# Patient Record
Sex: Male | Born: 1955
Health system: Southern US, Community
[De-identification: ages and names within clinical notes are randomized; demographics above are authoritative.]

## PROBLEM LIST (undated history)

## (undated) DIAGNOSIS — G8929 Other chronic pain: Secondary | ICD-10-CM

## (undated) DIAGNOSIS — Z8679 Personal history of other diseases of the circulatory system: Secondary | ICD-10-CM

## (undated) DIAGNOSIS — M25569 Pain in unspecified knee: Secondary | ICD-10-CM

## (undated) DIAGNOSIS — F419 Anxiety disorder, unspecified: Secondary | ICD-10-CM

## (undated) DIAGNOSIS — Z9889 Other specified postprocedural states: Secondary | ICD-10-CM

## (undated) DIAGNOSIS — T7840XA Allergy, unspecified, initial encounter: Secondary | ICD-10-CM

## (undated) DIAGNOSIS — R9389 Abnormal findings on diagnostic imaging of other specified body structures: Secondary | ICD-10-CM

## (undated) DIAGNOSIS — Z9289 Personal history of other medical treatment: Secondary | ICD-10-CM

## (undated) DIAGNOSIS — I4891 Unspecified atrial fibrillation: Secondary | ICD-10-CM

## (undated) DIAGNOSIS — M1990 Unspecified osteoarthritis, unspecified site: Secondary | ICD-10-CM

## (undated) DIAGNOSIS — Z91030 Bee allergy status: Secondary | ICD-10-CM

## (undated) DIAGNOSIS — IMO0001 Reserved for inherently not codable concepts without codable children: Secondary | ICD-10-CM

## (undated) HISTORY — DX: Other chronic pain: G89.29

## (undated) HISTORY — DX: Abnormal findings on diagnostic imaging of other specified body structures: R93.89

## (undated) HISTORY — DX: Personal history of other diseases of the circulatory system: Z86.79

## (undated) HISTORY — DX: Personal history of other medical treatment: Z92.89

## (undated) HISTORY — DX: Other specified postprocedural states: Z98.890

## (undated) HISTORY — DX: Unspecified atrial fibrillation: I48.91

## (undated) HISTORY — DX: Bee allergy status: Z91.030

## (undated) HISTORY — DX: Anxiety disorder, unspecified: F41.9

## (undated) HISTORY — DX: Unspecified osteoarthritis, unspecified site: M19.90

## (undated) HISTORY — DX: Allergy, unspecified, initial encounter: T78.40XA

## (undated) HISTORY — PX: COLONOSCOPY: SHX174

## (undated) HISTORY — PX: ATRIAL FIBRILLATION ABLATION: EP1191

## (undated) HISTORY — PX: OTHER SURGICAL HISTORY: SHX169

## (undated) HISTORY — DX: Reserved for inherently not codable concepts without codable children: IMO0001

## (undated) HISTORY — DX: Pain in unspecified knee: M25.569

---

## 1999-01-23 ENCOUNTER — Emergency Department (HOSPITAL_COMMUNITY): Admission: EM | Admit: 1999-01-23 | Discharge: 1999-01-23 | Payer: Self-pay

## 2000-12-26 ENCOUNTER — Encounter (INDEPENDENT_AMBULATORY_CARE_PROVIDER_SITE_OTHER): Payer: Self-pay | Admitting: Specialist

## 2000-12-26 ENCOUNTER — Ambulatory Visit: Admission: RE | Admit: 2000-12-26 | Discharge: 2000-12-26 | Payer: Self-pay | Admitting: Critical Care Medicine

## 2000-12-26 ENCOUNTER — Encounter: Payer: Self-pay | Admitting: Critical Care Medicine

## 2002-09-02 ENCOUNTER — Encounter: Admission: RE | Admit: 2002-09-02 | Discharge: 2002-09-02 | Payer: Self-pay | Admitting: Urology

## 2002-09-02 ENCOUNTER — Encounter: Payer: Self-pay | Admitting: Urology

## 2002-09-04 ENCOUNTER — Encounter (INDEPENDENT_AMBULATORY_CARE_PROVIDER_SITE_OTHER): Payer: Self-pay | Admitting: Specialist

## 2002-09-04 ENCOUNTER — Ambulatory Visit (HOSPITAL_BASED_OUTPATIENT_CLINIC_OR_DEPARTMENT_OTHER): Admission: RE | Admit: 2002-09-04 | Discharge: 2002-09-04 | Payer: Self-pay | Admitting: Urology

## 2003-07-20 LAB — HM COLONOSCOPY

## 2003-09-17 ENCOUNTER — Ambulatory Visit (HOSPITAL_COMMUNITY): Admission: RE | Admit: 2003-09-17 | Discharge: 2003-09-17 | Payer: Self-pay | Admitting: Gastroenterology

## 2004-07-01 ENCOUNTER — Ambulatory Visit (HOSPITAL_BASED_OUTPATIENT_CLINIC_OR_DEPARTMENT_OTHER): Admission: RE | Admit: 2004-07-01 | Discharge: 2004-07-01 | Payer: Self-pay | Admitting: Orthopedic Surgery

## 2007-05-24 DIAGNOSIS — IMO0001 Reserved for inherently not codable concepts without codable children: Secondary | ICD-10-CM

## 2007-05-24 HISTORY — DX: Reserved for inherently not codable concepts without codable children: IMO0001

## 2009-04-08 ENCOUNTER — Emergency Department (HOSPITAL_BASED_OUTPATIENT_CLINIC_OR_DEPARTMENT_OTHER): Admission: EM | Admit: 2009-04-08 | Discharge: 2009-04-08 | Payer: Self-pay | Admitting: Emergency Medicine

## 2009-04-25 DIAGNOSIS — Z9889 Other specified postprocedural states: Secondary | ICD-10-CM

## 2009-04-25 HISTORY — DX: Other specified postprocedural states: Z98.890

## 2009-05-12 ENCOUNTER — Encounter: Admission: RE | Admit: 2009-05-12 | Discharge: 2009-05-12 | Payer: Self-pay | Admitting: Cardiology

## 2009-05-13 ENCOUNTER — Ambulatory Visit (HOSPITAL_COMMUNITY): Admission: RE | Admit: 2009-05-13 | Discharge: 2009-05-13 | Payer: Self-pay | Admitting: Cardiology

## 2009-05-13 ENCOUNTER — Encounter (INDEPENDENT_AMBULATORY_CARE_PROVIDER_SITE_OTHER): Payer: Self-pay | Admitting: Cardiology

## 2009-06-16 ENCOUNTER — Ambulatory Visit: Payer: Self-pay | Admitting: Radiology

## 2009-08-18 ENCOUNTER — Encounter: Payer: Self-pay | Admitting: Family Medicine

## 2009-08-26 ENCOUNTER — Emergency Department (HOSPITAL_BASED_OUTPATIENT_CLINIC_OR_DEPARTMENT_OTHER): Admission: EM | Admit: 2009-08-26 | Discharge: 2009-08-26 | Payer: Self-pay | Admitting: Emergency Medicine

## 2009-08-26 ENCOUNTER — Ambulatory Visit: Payer: Self-pay | Admitting: Radiology

## 2009-09-02 ENCOUNTER — Ambulatory Visit: Payer: Self-pay | Admitting: Family Medicine

## 2009-09-02 DIAGNOSIS — I4891 Unspecified atrial fibrillation: Secondary | ICD-10-CM | POA: Insufficient documentation

## 2009-09-02 DIAGNOSIS — R300 Dysuria: Secondary | ICD-10-CM | POA: Insufficient documentation

## 2009-09-02 LAB — CONVERTED CEMR LAB
Bilirubin Urine: NEGATIVE
Glucose, Urine, Semiquant: NEGATIVE
Protein, U semiquant: NEGATIVE
Specific Gravity, Urine: <1.005
pH: 5

## 2009-10-19 ENCOUNTER — Ambulatory Visit: Payer: Self-pay | Admitting: Family Medicine

## 2009-10-19 DIAGNOSIS — G4700 Insomnia, unspecified: Secondary | ICD-10-CM | POA: Insufficient documentation

## 2009-10-19 DIAGNOSIS — F411 Generalized anxiety disorder: Secondary | ICD-10-CM | POA: Insufficient documentation

## 2009-10-19 DIAGNOSIS — J301 Allergic rhinitis due to pollen: Secondary | ICD-10-CM | POA: Insufficient documentation

## 2010-02-04 ENCOUNTER — Ambulatory Visit: Payer: Self-pay | Admitting: Family Medicine

## 2010-02-04 DIAGNOSIS — R5381 Other malaise: Secondary | ICD-10-CM | POA: Insufficient documentation

## 2010-02-04 DIAGNOSIS — R5383 Other fatigue: Secondary | ICD-10-CM | POA: Insufficient documentation

## 2010-02-04 DIAGNOSIS — M2550 Pain in unspecified joint: Secondary | ICD-10-CM | POA: Insufficient documentation

## 2010-02-05 ENCOUNTER — Ambulatory Visit: Payer: Self-pay | Admitting: Family Medicine

## 2010-02-05 LAB — CONVERTED CEMR LAB: Rhuematoid fact SerPl-aCnc: <20 intl units/mL (ref 0–20)

## 2010-02-08 LAB — CONVERTED CEMR LAB
BUN: 11 mg/dL (ref 6–23)
Basophils Absolute: 0 10*3/uL (ref 0.0–0.1)
Basophils Relative: 1.1 % (ref 0.0–3.0)
Eosinophils Absolute: 0.2 10*3/uL (ref 0.0–0.7)
HCT: 42.5 % (ref 39.0–52.0)
Hemoglobin: 15 g/dL (ref 13.0–17.0)
Lymphs Abs: 0.8 10*3/uL (ref 0.7–4.0)
MCHC: 35.4 g/dL (ref 30.0–36.0)
Neutro Abs: 3.1 10*3/uL (ref 1.4–7.7)
Neutrophils Relative %: 68.8 % (ref 43.0–77.0)
Platelets: 146 10*3/uL — ABNORMAL LOW (ref 150.0–400.0)
TSH: 1.24 microintl units/mL (ref 0.35–5.50)
WBC: 4.4 10*3/uL — ABNORMAL LOW (ref 4.5–10.5)

## 2010-02-22 ENCOUNTER — Telehealth: Payer: Self-pay | Admitting: Family Medicine

## 2010-04-01 ENCOUNTER — Emergency Department (HOSPITAL_BASED_OUTPATIENT_CLINIC_OR_DEPARTMENT_OTHER): Admission: EM | Admit: 2010-04-01 | Discharge: 2009-06-16 | Payer: Self-pay | Admitting: Emergency Medicine

## 2010-04-30 ENCOUNTER — Other Ambulatory Visit: Payer: Self-pay | Admitting: Family Medicine

## 2010-04-30 ENCOUNTER — Ambulatory Visit
Admission: RE | Admit: 2010-04-30 | Discharge: 2010-04-30 | Payer: Self-pay | Source: Home / Self Care | Attending: Family Medicine | Admitting: Family Medicine

## 2010-04-30 LAB — CONVERTED CEMR LAB
Ketones, urine, test strip: NEGATIVE
Protein, U semiquant: NEGATIVE
Urobilinogen, UA: 0.2
WBC Urine, dipstick: NEGATIVE
pH: 7

## 2010-04-30 LAB — BASIC METABOLIC PANEL
BUN: 15 mg/dL (ref 6–23)
CO2: 32 mEq/L (ref 19–32)
Calcium: 9.4 mg/dL (ref 8.4–10.5)
Chloride: 100 mEq/L (ref 96–112)
Creatinine, Ser: 1 mg/dL (ref 0.4–1.5)
GFR: 79.16 mL/min (ref >60.00)
Glucose, Bld: 93 mg/dL (ref 70–99)
Potassium: 5.2 mEq/L — ABNORMAL HIGH (ref 3.5–5.1)
Sodium: 139 mEq/L (ref 135–145)

## 2010-04-30 LAB — HEPATIC FUNCTION PANEL
ALT: 30 U/L (ref 0–53)
AST: 27 U/L (ref 0–37)
Albumin: 4.4 g/dL (ref 3.5–5.2)
Alkaline Phosphatase: 58 U/L (ref 39–117)
Bilirubin, Direct: 0.1 mg/dL (ref 0.0–0.3)
Total Bilirubin: 0.9 mg/dL (ref 0.3–1.2)
Total Protein: 6.9 g/dL (ref 6.0–8.3)

## 2010-04-30 LAB — LIPID PANEL
Cholesterol: 179 mg/dL (ref 0–200)
HDL: 41 mg/dL (ref >39.00)
LDL Cholesterol: 122 mg/dL — ABNORMAL HIGH (ref 0–99)
Total CHOL/HDL Ratio: 4
Triglycerides: 82 mg/dL (ref 0.0–149.0)
VLDL: 16.4 mg/dL (ref 0.0–40.0)

## 2010-04-30 LAB — CBC WITH DIFFERENTIAL/PLATELET
Basophils Absolute: 0 10*3/uL (ref 0.0–0.1)
Basophils Relative: 0.8 % (ref 0.0–3.0)
Eosinophils Absolute: 0.3 10*3/uL (ref 0.0–0.7)
Eosinophils Relative: 5.5 % — ABNORMAL HIGH (ref 0.0–5.0)
HCT: 43.6 % (ref 39.0–52.0)
Hemoglobin: 15.2 g/dL (ref 13.0–17.0)
Lymphocytes Relative: 21.9 % (ref 12.0–46.0)
Lymphs Abs: 1.1 10*3/uL (ref 0.7–4.0)
MCHC: 34.9 g/dL (ref 30.0–36.0)
MCV: 92.4 fl (ref 78.0–100.0)
Monocytes Absolute: 0.3 10*3/uL (ref 0.1–1.0)
Monocytes Relative: 7 % (ref 3.0–12.0)
Neutro Abs: 3.2 10*3/uL (ref 1.4–7.7)
Neutrophils Relative %: 64.8 % (ref 43.0–77.0)
Platelets: 163 10*3/uL (ref 150.0–400.0)
RBC: 4.72 Mil/uL (ref 4.22–5.81)
RDW: 12.4 % (ref 11.5–14.6)
WBC: 4.9 10*3/uL (ref 4.5–10.5)

## 2010-04-30 LAB — TSH: TSH: 1.39 u[IU]/mL (ref 0.35–5.50)

## 2010-05-07 ENCOUNTER — Ambulatory Visit
Admission: RE | Admit: 2010-05-07 | Discharge: 2010-05-07 | Payer: Self-pay | Source: Home / Self Care | Attending: Family Medicine | Admitting: Family Medicine

## 2010-05-07 ENCOUNTER — Other Ambulatory Visit: Payer: Self-pay | Admitting: Family Medicine

## 2010-05-07 LAB — PSA: PSA: 0.53 ng/mL (ref 0.10–4.00)

## 2010-05-25 NOTE — Assessment & Plan Note (Signed)
Summary: NEW PT TO LBF/TO EST/CJR   Vital Signs:  Patient profile:   55 year old male Height:      78 inches Weight:      78 pounds BMI:     9.05 Temp:     97.7 degrees F oral Pulse rate:   60 / minute Pulse rhythm:   regular Resp:     12 per minute BP sitting:   110 / 62  (left arm) Cuff size:   large  Vitals Entered By: Sid Falcon LPN (Sep 02, 2009 4:04 PM) CC: New to establish, pain with urination Is Patient Diabetic? No   History of Present Illness: Here to establish as new pt.  Hx a fib/flutter with ablation procedure in [place] 2weeks ago which went well. Had foley catheter and now presents with burning with urination past week.  No fever, chills, nausea, vomiting, or back pain.   PMH, SH , AND FH reviewed.  Preventive Screening-Counseling & Management  Alcohol-Tobacco     Smoking Status: never     Year Quit: 1978  Caffeine-Diet-Exercise     Does Patient Exercise: no  Allergies (verified): No Known Drug Allergies  Past History:  Family History: Last updated: 09/02/2009 Family History of Alcoholism/Addiction, mother and father Family History of Arthritis, maternal grandmother  Social History: Last updated: 09/02/2009 Occupation:  Production designer, theatre/television/film Married Never Smoked Alcohol use-no Regular exercise-no  Risk Factors: Exercise: no (09/02/2009)  Risk Factors: Smoking Status: never (09/02/2009)  Past Medical History: A-Fib A-Flutter  Past Surgical History: Orthroscopic knee surgery bil [place] veins stripped both legs left testicle removed  (no cancer, benign mass)  Family History: Family History of Alcoholism/Addiction, mother and father Family History of Arthritis, maternal grandmother  Social History: Occupation:  Production designer, 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    Physical Exam  General:  Well-developed,well-nourished,in no acute distress; alert,appropriate and cooperative throughout examination Mouth:  Oral mucosa and oropharynx without lesions or exudates.  Teeth in good repair. Neck:  No deformities, masses, or tenderness noted. Lungs:  Normal respiratory effort, chest expands symmetrically. Lungs are clear to auscultation, no crackles or wheezes. Heart:  normal rate, regular rhythm, and no murmur.   Extremities:  no edema.   Impression & Recommendations:  Problem # 1:  DYSURIA (ICD-788.1) Assessment New Probable UTI with recent Foley.  urine cx and start Cipro pending cx. Orders: T-Culture, Urine ([id number]) UA Dipstick w/o Micro (manual) (25956)  His updated medication list for this problem includes:    Ciprofloxacin Hcl 500 Mg Tabs (Ciprofloxacin hcl) ..... One by mouth two times a day for 5 days  Problem # 2:  ATRIAL FIBRILLATION (ICD-427.31)  His updated medication list for this problem includes:    Metoprolol Succinate 100 Mg Xr24h-tab (Metoprolol succinate) ..... Once daily    Multaq 400 Mg Tabs (Dronedarone hcl) ..... One tab two times a day  Complete Medication List: 1)  Metoprolol Succinate 100 Mg Xr24h-tab (Metoprolol succinate) .... Once daily 2)  Citalopram Hydrobromide 20 Mg Tabs (Citalopram hydrobromide) .... Once daily 3)  Fexofenadine Hcl 180 Mg Tabs (Fexofenadine hcl) .... Once daily 4)  Multaq 400 Mg Tabs (Dronedarone hcl) .... One tab two times a day 5)  Ciprofloxacin Hcl 500 Mg Tabs (Ciprofloxacin hcl) .Marland KitchenMarland KitchenMarland Kitchen  One by mouth two times a day for 5 days 6)  Pradaxa 75 Mg Caps (Dabigatran etexilate mesylate) .... One by mouth bid  Patient Instructions: 1)  Drink plenty of fluids up to 3-4 quarts a day. Cranberry juice is especially recommended in addition to large amounts of water.  Avoid caffeine & carbonated drinks, they tend to irritate the bladder, Return in 3-5 days if you're not better: sooner if you're feeling worse.  Prescriptions: CIPROFLOXACIN HCL 500 MG TABS (CIPROFLOXACIN HCL) one by mouth two times a day for 5 days  #10 x 0   Entered and Authorized by:   Evelena Peat MD   Signed by:   Evelena Peat MD on 09/02/2009   Method used:   [facility]        [facility]  [place] [address]       [address]       [place], [place]  [address]       Ph: [phone] or [phone]       Fax: [phone]   RxID:   [id number]   Preventive Care Screening  Last Tetanus Booster:    Date:  04/26/2007    Results:  Historical   Colonoscopy:    Date:  04/26/2003    Results:  normal      Pneumovax 2007   Laboratory Results   Urine Tests    Routine Urinalysis   Color: yellow Appearance: Clear Glucose: negative   (Normal Range: Negative) Bilirubin: negative   (Normal Range: Negative) Ketone: negative   (Normal Range: Negative) Spec. Gravity: <1.005   (Normal Range: 1.003-1.035) Blood: small   (Normal Range: Negative) pH: 5.0   (Normal Range: 5.0-8.0) Protein: negative   (Normal Range: Negative) Urobilinogen: 0.2   (Normal Range: 0-1) Nitrite: negative   (Normal Range: Negative) Leukocyte Esterace: moderate   (Normal Range: Negative)    Comments: Sid Falcon LPN  Sep 02, 2009 4:31 PM

## 2010-05-25 NOTE — Assessment & Plan Note (Signed)
Summary: NUMBNESS AND PAIN IN SHOULDERS AND ELBOW/ ALSO IN FINGERS/CJR   Vital Signs:  Patient profile:   55 year old male Weight:      238 pounds Temp:     98.4 degrees F oral BP sitting:   100 / 64  (left arm) Cuff size:   large  Vitals Entered By: Sid Falcon LPN (February 04, 2010 1:33 PM)  History of Present Illness: Patient seen with multiple nonspecific complaints. He had history of ablation for atrial fibrillation several months ago that and has had no evidence for recurrent A. fib. He describes bilateral shoulder achiness over the past several weeks but no other arthralgias. He has increased fatigue and overall poor sleep quality the past few months. No specific stressors. He feels "jittery" frequently. Denies any appetite or weight changes. He sleeps about 6-7 hours of restless sleep at night.  No leg discomfort.  Has tried Advil p.m. without much success. No significant caffeine use.  Denies depressive symptoms. No visible joint swelling, erythema, or warmth. Patient reports prior history of low testosterone but was never treated.  Not checked in some time.  Allergies (verified): 1)  ! 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    Physical Exam  General:  Well-developed,well-nourished,in no acute distress; alert,appropriate and cooperative throughout examination Eyes:  pupils equal, pupils round, and pupils reactive to light.   Ears:  External ear exam shows no significant lesions or deformities.  Otoscopic examination reveals clear canals, tympanic membranes are intact bilaterally without bulging, retraction, inflammation or discharge. Hearing is grossly normal bilaterally. Mouth:  Oral mucosa and oropharynx without lesions or exudates.  Teeth in good repair. Neck:  No deformities, masses, or tenderness noted. Lungs:  Normal respiratory effort, chest expands symmetrically. Lungs are clear to auscultation, no crackles or wheezes. Heart:  Normal rate and regular rhythm. S1 and S2 normal without gallop, murmur, click, rub or other extra sounds. Abdomen:  Bowel sounds positive,abdomen soft and non-tender without masses, organomegaly or hernias noted. Msk:  No deformity or scoliosis noted of thoracic or lumbar spine.   Extremities:  No clubbing, cyanosis, edema, or deformity noted with normal full range of motion of all joints.   Neurologic:  alert & oriented X3, cranial nerves II-XII intact, strength normal in all extremities, and gait normal.   Skin:  no rashes.   Psych:  normally interactive, good eye contact, not anxious appearing, and not depressed appearing.     Impression & Recommendations:  Problem # 1:  INSOMNIA, CHRONIC (ICD-307.42) sleep hygiene discussed.  Short term trial of AMbien.  Problem # 2:  ARTHRALGIA (ICD-719.40) check screeing labs.  No evidence for active inflammatory arthritis on exam today.  Problem # 3:  FATIGUE (ICD-780.79) ?  sec to low  testosterone.  Will repeat labs.  Complete Medication List: 1)  Metoprolol Succinate 100 Mg Xr24h-tab (Metoprolol succinate) .... 1/2 daily 2)  Citalopram Hydrobromide 20 Mg Tabs (Citalopram hydrobromide) .... Once daily 3)  Fexofenadine Hcl 180 Mg Tabs (Fexofenadine hcl) .... Once daily 4)  Aspirin 325 Mg Tabs (Aspirin) .... Once daily 5)  Ambien 10 Mg Tabs (Zolpidem tartrate) .... One by mouth at bedtime as needed insomnia  Other Orders: Admin 1st Vaccine (14782) Flu Vaccine 67yrs + (95621)  Patient Instructions: 1)  Schedule the following labs:  CBC, BMP, TSH, ESR, RA latex, ANA antibodies, Total testosterone levels   780.79, 719.40 Prescriptions: AMBIEN 10 MG TABS (ZOLPIDEM TARTRATE) one by mouth at bedtime as needed insomnia  #30 x 0   Entered and Authorized by:   Evelena Peat MD   Signed by:   Evelena Peat MD on 02/04/2010   Method used:   Print then Give to Patient   RxID:   [id number]      Flu Vaccine Consent Questions     Do you have a history of severe allergic reactions to this vaccine? no    Any prior history of allergic reactions to egg and/or gelatin? no    Do you have a sensitivity to the preservative Thimersol? no    Do you have a past history of Guillan-Barre Syndrome? no    Do you currently have an acute febrile illness? no    Have you ever had a severe reaction to latex? no    Vaccine information given and explained to patient? yes    Are you currently pregnant? no    Lot Number:[id number]   Exp Date:10/23/2010   Site Given  Left Deltoid IMlu

## 2010-05-25 NOTE — Assessment & Plan Note (Signed)
Summary: med check and refills/cjr   Vital Signs:  Patient profile:   55 year old male Temp:     98.8 degrees F oral BP sitting:   120 / 80  (left arm) Cuff size:   large  Vitals Entered By: Sid Falcon LPN (October 19, 2009 4:31 PM) CC: med check, reillls   History of Present Illness: Hx ablation for a fib April in [place].  Good results with no recurrence. Has been taken off Pradaxa and Multaq. Needs refills of several medications.  Remains on metorolol.  No recent palpitations. No chest pain.  Seasonal allergies on fexofenadine and controlled. Hx of chronic anxiety controlled with citalopram.  No hx of depression.  Chronic intermittent insomnia.  Has used Tylenol PM and Klonopin in past with good success. No signif caffeine use.  Drinks about 2 glasses wine per night.  Low stress levels. Able to fall asleep but freq wakes about 3 am and unable to go back to sleep.  Needs med refills.  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    Physical Exam  General:  Well-developed,well-nourished,in no acute  distress; alert,appropriate and cooperative throughout examination Head:  Normocephalic and atraumatic without obvious abnormalities. No apparent alopecia or balding. Mouth:  Oral mucosa and oropharynx without lesions or exudates.  Teeth in good repair. Neck:  No deformities, masses, or tenderness noted. Lungs:  Normal respiratory effort, chest expands symmetrically. Lungs are clear to auscultation, no crackles or wheezes. Heart:  Normal rate and regular rhythm. S1 and S2 normal without gallop, murmur, click, rub or other extra sounds. Extremities:  no edema. Psych:  normally interactive, good eye contact, not anxious appearing, and not depressed appearing.     Impression & Recommendations:  Problem # 1:  ATRIAL FIBRILLATION (ICD-427.31) Assessment Improved currently sinus rhythm after ablation procedure. The following medications were removed from the medication list:    Multaq 400 Mg Tabs (Dronedarone hcl) ..... One tab two times a day His updated medication list for this problem includes:    Metoprolol Succinate 100 Mg Xr24h-tab (Metoprolol succinate) ..... Once daily  Problem # 2:  INSOMNIA, CHRONIC (ICD-307.42) Assessment: Deteriorated sleep hygiene discussed with handout given.  Rec reduce ETOH consumption at night and discussed how ETOH metabolism can trigger early morning awakening.  Problem # 3:  ANXIETY STATE, UNSPECIFIED (ICD-300.00) refilled med His updated medication list for this problem includes:    Citalopram Hydrobromide 20 Mg Tabs (Citalopram hydrobromide) ..... Once daily  Problem # 4:  ALLERGIC RHINITIS, SEASONAL (ICD-477.0)  Complete Medication List: 1)  Metoprolol Succinate 100 Mg Xr24h-tab (Metoprolol succinate) .... Once daily 2)  Citalopram Hydrobromide 20 Mg Tabs (Citalopram hydrobromide) .... Once daily 3)  Fexofenadine Hcl 180 Mg Tabs (Fexofenadine hcl) .... Once daily  Patient Instructions: 1)  Consider gradually scaling back night time  use of alcohol  which might reduce your early morning awakening. 2)  OK to use Tylenol PM. Prescriptions: FEXOFENADINE HCL 180 MG TABS (FEXOFENADINE HCL) once daily  #90 x 3   Entered and Authorized by:   Evelena Peat MD   Signed by:   Evelena Peat MD on 10/19/2009   Method used:   Electronically to        [facility]* (mail-order)       [address] [place], [place]  [address]       Ph: [phone]       Fax: [phone]   RxID:   [id number] CITALOPRAM HYDROBROMIDE 20 MG TABS (CITALOPRAM HYDROBROMIDE) once daily  #90 x 3   Entered and Authorized by:   Evelena Peat MD   Signed by:   Evelena Peat MD on 10/19/2009   Method used:   Electronically to        [facility]* (mail-order)       [address] [place], [place]  [address]       Ph: [phone]       Fax: [phone]   RxID:   [id number] METOPROLOL SUCCINATE 100 MG XR24H-TAB (METOPROLOL SUCCINATE) once daily  #90 x 3   Entered and Authorized by:   Evelena Peat MD   Signed by:   Evelena Peat MD on 10/19/2009   Method used:   Electronically to        [facility]* (mail-order)       [address] [place], [place]  [address]       Ph: [phone]       Fax: [phone]   RxID:   [id number]

## 2010-05-25 NOTE — Progress Notes (Signed)
Summary: Ambien, ins will pay for #10 only monthly, FYI  Phone Note Refill Request Message from:  Patient on February 22, 2010 11:29 AM  Refills Requested: Medication #1:  AMBIEN 10 MG TABS one by mouth at bedtime as needed insomnia.   Notes: [facility].    Initial call taken by: Debbra Riding,  February 22, 2010 11:29 AM  Follow-up for Phone Call        This med was just filled at [facility] on 10/13, #30 with 0 refills. LMTCB to discuss Follow-up by: Sid Falcon LPN,  February 22, 2010 5:06 PM  Additional Follow-up for Phone Call Additional follow up Details #1::        returning Nancy's call. please call [phone] Additional Follow-up by: Warnell Forester,  February 23, 2010 12:49 PM    Additional Follow-up for Phone Call Additional follow up Details #2::    Spoke with pt, he states [facility] [place] only disp [facility] #10 pills on 10/13, something about his insurance would only allow #10.  Pt asked I call his [facility] and provide additional information if necessary.  Per Olegario Messier, pharmacist, his insurance will only pay for #10 every 30 days.  Pt may request a prior authorization be faxed to Dr Caryl Never to take more frequently.  Pt informed that [facility] could [facility] to request a prior auth.  Other option is to pay out of pocket for additional Ambien if needed every night. Sid Falcon LPN  February 24, 2010 10:02 AM Will be happy to complete prio Thereasa Parkin if he so desires. Follow-up by: Evelena Peat MD,  February 24, 2010 10:58 AM

## 2010-05-27 NOTE — Assessment & Plan Note (Signed)
Summary: CPX//SLM   Vital Signs:  Patient profile:   55 year old male Height:      78 inches Weight:      240 pounds Temp:     97.9 degrees F oral BP sitting:   108 / 68  (left arm) Cuff size:   large  Vitals Entered By: Sid Falcon LPN (May 07, 2010 2:07 PM)  History of Present Illness: Here for CPE.   Increased job stress and poor sleep. Has used Clonazepam in past with success. Ambien with mixed results.  Insomnia is chronic and intermittent, No signif caffeine or ETOH use.  Hx A fib and ablation procedure. Tapering off metoprolol.  Colonoscopy and tetanus up to date.  Plans to start more regualr exercise soon.  Clinical Review Panels:  Prevention   Last Colonoscopy:  normal (04/26/2003)  Immunizations   Last Tetanus Booster:  Historical (04/26/2007)   Last Flu Vaccine:  Fluvax 3+ (02/04/2010)   Allergies: 1)  ! 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  Past Surgical History: Orthroscopic knee surgery bil [place] veins stripped both legs left testicle removed  (no cancer, benign mass) Ablation procedure for A fib PMH-FH-SH reviewed for relevance  Review of Systems  The patient denies anorexia, fever, weight loss, weight gain, vision loss, decreased hearing, hoarseness, chest pain, syncope, dyspnea on exertion, peripheral edema, prolonged cough, headaches, hemoptysis, abdominal pain, melena, hematochezia, severe indigestion/heartburn, hematuria, incontinence, genital sores, muscle weakness, suspicious skin lesions,  transient blindness, difficulty walking, depression, unusual weight change, abnormal bleeding, enlarged lymph nodes, and testicular masses.    Physical Exam  General:  Well-developed,well-nourished,in no acute distress; alert,appropriate and cooperative throughout examination Head:  Normocephalic and atraumatic without obvious abnormalities. No apparent alopecia or balding. Eyes:  No corneal or conjunctival inflammation noted. EOMI. Perrla. Funduscopic exam benign, without hemorrhages, exudates or papilledema. Vision grossly normal. Ears:  External ear exam shows no significant lesions or deformities.  Otoscopic examination reveals clear canals, tympanic membranes are intact bilaterally without bulging, retraction, inflammation or discharge. Hearing is grossly normal bilaterally. Mouth:  Oral mucosa and oropharynx without lesions or exudates.  Teeth in good repair. Neck:  No deformities, masses, or tenderness noted. Lungs:  Normal respiratory effort, chest expands symmetrically. Lungs are clear to auscultation, no crackles or wheezes. Heart:  Normal rate and regular rhythm. S1 and S2 normal without gallop, murmur, click, rub or other extra sounds. Abdomen:  Bowel sounds positive,abdomen soft and non-tender without masses, organomegaly or hernias noted. Rectal:  No external abnormalities noted. Normal sphincter tone. No rectal masses or tenderness. Prostate:  Prostate gland firm and smooth, no enlargement, nodularity, tenderness, mass, asymmetry or induration. Msk:  No deformity or scoliosis noted of thoracic or lumbar spine.   Extremities:  No clubbing, cyanosis, edema, or deformity noted with normal full range of motion of all joints.   Neurologic:  alert & oriented X3, cranial nerves II-XII intact, and strength normal in all extremities.   Skin:  no rashes and no suspicious lesions.   Cervical Nodes:  No lymphadenopathy noted Psych:  Cognition and judgment appear intact. Alert and cooperative  with normal attention span and concentration. No apparent  delusions, illusions, hallucinations   Impression & Recommendations:  Problem # 1:  ROUTINE GENERAL MEDICAL EXAM@HEALTH  CARE FACL (ICD-V70.0) Add PSA to labs as this was overlooked.   other labs reviewed with pt. Orders: TLB-PSA (Prostate Specific Antigen) ([id number])  Problem # 2:  INSOMNIA, CHRONIC (ICD-307.42) refilled Clonazepam to use as needed for insomnia.  He will not take any more ambien. Sleep hygiene discussed.  Complete Medication List: 1)  Metoprolol Succinate 25 Mg Xr24h-tab (Metoprolol succinate) 2)  Citalopram Hydrobromide 20 Mg Tabs (Citalopram hydrobromide) .... Once daily 3)  Fexofenadine Hcl 180 Mg Tabs (Fexofenadine hcl) .... Once daily 4)  Aspirin 325 Mg Tabs (Aspirin) .... Once daily 5)  Clonazepam 1 Mg Tabs (Clonazepam) .... One half to one by mouth at bedtime as needed 6)  Epipen 2-pak 0.3 Mg/0.41ml Devi (Epinephrine) .... As needed bee stings  Patient Instructions: 1)  It is important that you exercise reguarly at least 20 minutes 5 times a week. If you develop chest pain, have severe difficulty breathing, or feel very tired, stop exercising immediately and seek medical attention.  2)  Take one half tablet of metoprolol for another 2-3 weeks then discontinue. Prescriptions: CLONAZEPAM 1 MG TABS (CLONAZEPAM) one half to one by mouth at bedtime as needed  #30 x 3   Entered and Authorized by:   Evelena Peat MD   Signed by:   Evelena Peat MD on 05/07/2010   Method used:   Print then Give to Patient   RxID:   [id number]    Orders Added: 1)  TLB-PSA (Prostate Specific Antigen) [[id number]] 2)  Est. Patient 40-64 years [[id number]]

## 2010-07-12 ENCOUNTER — Ambulatory Visit (INDEPENDENT_AMBULATORY_CARE_PROVIDER_SITE_OTHER): Payer: BC Managed Care – PPO | Admitting: Family Medicine

## 2010-07-12 ENCOUNTER — Encounter: Payer: Self-pay | Admitting: Family Medicine

## 2010-07-12 VITALS — BP 102/70 | Temp 99.4°F | Ht 78.0 in | Wt 239.0 lb

## 2010-07-12 DIAGNOSIS — T7840XA Allergy, unspecified, initial encounter: Secondary | ICD-10-CM

## 2010-07-12 DIAGNOSIS — F3289 Other specified depressive episodes: Secondary | ICD-10-CM

## 2010-07-12 DIAGNOSIS — F411 Generalized anxiety disorder: Secondary | ICD-10-CM

## 2010-07-12 DIAGNOSIS — G4700 Insomnia, unspecified: Secondary | ICD-10-CM

## 2010-07-12 DIAGNOSIS — F329 Major depressive disorder, single episode, unspecified: Secondary | ICD-10-CM

## 2010-07-12 DIAGNOSIS — F32A Depression, unspecified: Secondary | ICD-10-CM

## 2010-07-12 DIAGNOSIS — J209 Acute bronchitis, unspecified: Secondary | ICD-10-CM

## 2010-07-12 DIAGNOSIS — R208 Other disturbances of skin sensation: Secondary | ICD-10-CM

## 2010-07-12 DIAGNOSIS — R209 Unspecified disturbances of skin sensation: Secondary | ICD-10-CM

## 2010-07-12 MED ORDER — EPINEPHRINE 0.3 MG/0.3ML IJ DEVI: 0.3000 mg | Freq: Once | INTRAMUSCULAR | Status: AC

## 2010-07-12 MED ORDER — CLONAZEPAM 1 MG PO TABS: 1.0000 mg | ORAL_TABLET | Freq: Every evening | ORAL | Status: DC | PRN

## 2010-07-12 MED ORDER — CITALOPRAM HYDROBROMIDE 20 MG PO TABS: 20.0000 mg | ORAL_TABLET | Freq: Every day | ORAL | Status: DC

## 2010-07-12 MED ORDER — HYDROCOD POLST-CHLORPHEN POLST 10-8 MG/5ML PO LQCR: 5.0000 mL | Freq: Two times a day (BID) | ORAL | Status: DC | PRN

## 2010-07-12 NOTE — Progress Notes (Signed)
  Subjective:    Patient ID: Jeffrey [name], male    DOB: March 11, 1956, 55 y.o.   MRN: [id number]  HPI  Patient seen for the several following items  Onset last week of cough which is mostly nonproductive. Diffuse body aches. Cough especially bothersome at night. Has taken several over-the-counter medications without relief. Nonsmoker. Some minimal postnasal drip symptoms    patient has history of allergy to bee sting. Use an EpiPen and needs refills.   Approximately 2 weeks history of right anterior thigh burning sensation. No weakness. No low back pain. No associated rash. Symptoms relatively mild. No alleviating factors.  History of depression treated with Celexa 20 mg daily. Symptoms relatively stable. Increased job stress. No suicidal ideation.   Chronic insomnia treated recently with clonazepam which seems to be much more effective than Ambien. Takes 0.5 mg at night. No side effects.   Review of Systems  Constitutional: Negative for fever, chills and appetite change.  HENT: Positive for congestion. Negative for sore throat.   Respiratory: Positive for cough. Negative for wheezing.   Cardiovascular: Negative for chest pain, palpitations and leg swelling.  Gastrointestinal: Negative for nausea, vomiting and abdominal pain.  Genitourinary: Negative for dysuria.  Musculoskeletal: Positive for myalgias.  Skin: Negative for rash.  Hematological: Negative for adenopathy.       Objective:   Physical Exam  patient is alert and in no distress  oropharynx is clear Eardrums normal Neck supple no adenopathy Chest clear to auscultation Heart regular rhythm and rate no murmur  right thigh no rash. nontender to palpation Neuro exam strength is full lower extremities.       Assessment & Plan:   #1 acute bronchitis-most likely viral. Tussionex as needed for cough #2 dysesthesia right anterior thigh. Observation for now. #3 history of depression stable refill Celexa for one year #4  history of chronic insomnia. Using clonazepam with good success. Refilled for 6 months.

## 2010-07-13 LAB — CBC
HCT: 43.2 % (ref 39.0–52.0)
Hemoglobin: 14.6 g/dL (ref 13.0–17.0)
MCHC: 33.8 g/dL (ref 30.0–36.0)
MCV: 90.4 fL (ref 78.0–100.0)
Platelets: 204 K/uL (ref 150–400)
RBC: 4.77 MIL/uL (ref 4.22–5.81)
RDW: 13.1 % (ref 11.5–15.5)
WBC: 7.7 10*3/uL (ref 4.0–10.5)

## 2010-07-13 LAB — APTT: aPTT: 64 s — ABNORMAL HIGH (ref 24–37)

## 2010-07-13 LAB — DIFFERENTIAL
Basophils Absolute: 0.1 10*3/uL (ref 0.0–0.1)
Basophils Relative: 1 % (ref 0–1)
Eosinophils Absolute: 0.2 10*3/uL (ref 0.0–0.7)
Eosinophils Relative: 3 % (ref 0–5)
Lymphocytes Relative: 19 % (ref 12–46)
Lymphs Abs: 1.5 K/uL (ref 0.7–4.0)
Monocytes Absolute: 0.6 K/uL (ref 0.1–1.0)
Monocytes Relative: 8 % (ref 3–12)
Neutro Abs: 5.3 10*3/uL (ref 1.7–7.7)
Neutrophils Relative %: 69 % (ref 43–77)

## 2010-07-13 LAB — BASIC METABOLIC PANEL WITH GFR
BUN: 17 mg/dL (ref 6–23)
Calcium: 8.6 mg/dL (ref 8.4–10.5)
Chloride: 104 meq/L (ref 96–112)
GFR calc Af Amer: >60 mL/min (ref >60)
Glucose, Bld: 54 mg/dL — ABNORMAL LOW (ref 70–99)
Potassium: 4.8 meq/L (ref 3.5–5.1)

## 2010-07-13 LAB — BASIC METABOLIC PANEL
CO2: 29 mEq/L (ref 19–32)
Creatinine, Ser: 1 mg/dL (ref 0.4–1.5)
GFR calc non Af Amer: >60 mL/min (ref >60)
Sodium: 142 mEq/L (ref 135–145)

## 2010-07-13 LAB — PROTIME-INR
INR: 1.58 — ABNORMAL HIGH (ref 0.00–1.49)
Prothrombin Time: 18.7 s — ABNORMAL HIGH (ref 11.6–15.2)

## 2010-07-15 LAB — DIFFERENTIAL
Eosinophils Absolute: 0.2 10*3/uL (ref 0.0–0.7)
Eosinophils Relative: 4 % (ref 0–5)
Lymphs Abs: 1.9 10*3/uL (ref 0.7–4.0)
Monocytes Relative: 6 % (ref 3–12)

## 2010-07-15 LAB — CBC
HCT: 43.2 % (ref 39.0–52.0)
Hemoglobin: 15 g/dL (ref 13.0–17.0)
MCHC: 34.8 g/dL (ref 30.0–36.0)
RDW: 11.9 % (ref 11.5–15.5)
WBC: 5.3 10*3/uL (ref 4.0–10.5)

## 2010-07-15 LAB — BASIC METABOLIC PANEL
Calcium: 8.7 mg/dL (ref 8.4–10.5)
GFR calc Af Amer: >60 mL/min (ref >60)
Potassium: 3.9 mEq/L (ref 3.5–5.1)

## 2010-07-15 LAB — PROTIME-INR: Prothrombin Time: 24 seconds — ABNORMAL HIGH (ref 11.6–15.2)

## 2010-07-27 LAB — DIFFERENTIAL
Basophils Absolute: 0 10*3/uL (ref 0.0–0.1)
Eosinophils Absolute: 0.2 10*3/uL (ref 0.0–0.7)
Lymphocytes Relative: 19 % (ref 12–46)
Monocytes Absolute: 0.4 10*3/uL (ref 0.1–1.0)
Monocytes Relative: 7 % (ref 3–12)

## 2010-07-27 LAB — BASIC METABOLIC PANEL
BUN: 17 mg/dL (ref 6–23)
CO2: 28 mEq/L (ref 19–32)
Calcium: 9.6 mg/dL (ref 8.4–10.5)
Chloride: 105 mEq/L (ref 96–112)
GFR calc Af Amer: >60 mL/min (ref >60)
Glucose, Bld: 125 mg/dL — ABNORMAL HIGH (ref 70–99)
Sodium: 143 mEq/L (ref 135–145)

## 2010-07-27 LAB — CBC
Platelets: 171 10*3/uL (ref 150–400)
RBC: 5.41 MIL/uL (ref 4.22–5.81)

## 2010-07-27 LAB — POCT CARDIAC MARKERS: Myoglobin, poc: 38.8 ng/mL (ref 12–200)

## 2010-08-25 ENCOUNTER — Encounter: Payer: Self-pay | Admitting: Family Medicine

## 2010-08-25 ENCOUNTER — Ambulatory Visit (INDEPENDENT_AMBULATORY_CARE_PROVIDER_SITE_OTHER): Payer: BC Managed Care – PPO | Admitting: Family Medicine

## 2010-08-25 VITALS — BP 110/70 | Temp 98.3°F

## 2010-08-25 DIAGNOSIS — R209 Unspecified disturbances of skin sensation: Secondary | ICD-10-CM

## 2010-08-25 DIAGNOSIS — R202 Paresthesia of skin: Secondary | ICD-10-CM

## 2010-08-25 NOTE — Progress Notes (Signed)
  Subjective:    Patient ID: Jeffrey [name], male    DOB: 07-09-1955, 55 y.o.   MRN: [id number]  HPI Patient seen with persistent and progressive dysesthesias right thigh anteriorly. Symptoms started over 2 months ago. No definite weakness. Denies low back pain. Has not noted any rashes. Symptoms seem worse at night. Rated 4/10 in severity. Denies any other extremity paresthesias or dysesthesias. He denies any appetite or weight changes. No fevers, chills, or night sweats.   No alleviating or aggravating factors.   Review of Systems  Constitutional: Negative for fever, chills, activity change, appetite change and unexpected weight change.  Respiratory: Negative for shortness of breath.   Cardiovascular: Negative for chest pain, palpitations and leg swelling.  Gastrointestinal: Negative for abdominal pain.  Genitourinary: Negative for dysuria.  Neurological: Negative for dizziness, seizures, syncope, weakness, light-headedness and headaches.  Hematological: Negative for adenopathy. Does not bruise/bleed easily.       Objective:   Physical Exam  Constitutional: He is oriented to person, place, and time. He appears well-developed and well-nourished. No distress.  Cardiovascular: Normal rate, regular rhythm and normal heart sounds.   No murmur heard. Pulmonary/Chest: Effort normal and breath sounds normal. No respiratory distress. He has no wheezes.  Musculoskeletal: He exhibits no edema.       No tenderness lumbar spine. Straight leg raises negative. Full range of motion lumbosacral spine  Neurological: He is alert and oriented to person, place, and time. He has normal reflexes. No cranial nerve deficit.       Full-strength lower extremities. Subjective mild sensory impairment touch right anterior thigh  Psychiatric: He has a normal mood and affect.          Assessment & Plan:  Paresthesias and dysesthesias right anterior thigh. No focal weakness. Given duration of symptoms and  progression MRI lumbar spine to further evaluate. If normal consider neurology evaluation.  ?peripheral nerve compression.  We discussed possible treatments such as Lyrica but he wishes to wait at this time as symptoms are relatively mild.

## 2010-08-25 NOTE — Patient Instructions (Signed)
We will schedule MRI of your lumbar spine and call you after results are back.

## 2010-09-09 ENCOUNTER — Ambulatory Visit
Admission: RE | Admit: 2010-09-09 | Discharge: 2010-09-09 | Disposition: A | Discharge status: Home / Self Care | Payer: BC Managed Care – PPO | Source: Ambulatory Visit | Attending: Family Medicine | Admitting: Family Medicine

## 2010-09-09 DIAGNOSIS — R202 Paresthesia of skin: Secondary | ICD-10-CM

## 2010-09-10 NOTE — Op Note (Signed)
NAME:  Jeffrey [name], Jeffrey [name]                        ACCOUNT NO.:  [id number]   MEDICAL RECORD NO.:  [id number]                   PATIENT TYPE:  AMB   LOCATION:  [facility]                                 FACILITY:  [facility]   PHYSICIAN:  Mark C. Vernie Ammons, M.D.               DATE OF BIRTH:  [birth year]   DATE OF PROCEDURE:  09/04/2002  DATE OF DISCHARGE:                                 OPERATIVE REPORT   PREOPERATIVE DIAGNOSIS:  Left testicular mass.   POSTOPERATIVE DIAGNOSIS:  Left testicular mass.   PROCEDURE:  Left radical orchiectomy.   SURGEON:  Mark C. Vernie Ammons, M.D.   ANESTHESIA:  General LMA.   ESTIMATED BLOOD LOSS:  Approximately 5 mL   SPECIMENS:  Left testicle and spermatic cord to pathology.   COMPLICATIONS:  None.   INDICATIONS FOR PROCEDURE:  The patient is a 55 year old white male who has  developed a painless lump on the testicle. It was evaluated with ultrasound  and found to be consistent with an intratesticular abnormality with an  inhomogeneous echo texture. Beta HCG and alpha fetoprotein are negative,  chest x-ray is clear. He is brought to the OR for left radical orchiectomy.  He understands the risks, complications, alternatives and limitations.   DESCRIPTION OF PROCEDURE:  After informed consent, the patient brought to  the major OR, placed on the table in supine position, administered general  anesthesia. Genitalia and lower abdomen were sterilely prepped and draped. A  transverse left inguinal incision was then made and carried down through the  subcutaneous tissue, Scarpa's fascia and exposed the external oblique  fascia. This was incised along the course of the fibers exposing the cord.  The cord was isolated, clamped proximally and then further dissection was  undertaken to dissect the testicle from the scrotum. The gubernaculum was  cut with the Bovie electrocautery unit. I then freed up further cord into  the level of the high internal inguinal ring. The cord  was then isolated  into three sections with hemostat clamps. These were clamped and the cord  was divided proximal to this. Each portion of the cord then was doubly  ligated first with #1 Vicryl suture and then with a 2-0 silk suture. The 2-0  silk sutures were left long for future identification is necessary. I then  closed the external oblique fascia with a running 3-0 Vicryl suture.  Throughout the closure and operation, the ilioinguinal nerve was identified  and spared. I used 0.25% plain Marcaine then to infiltrate in the area of  the ilioinguinal nerves and in the subcu region using 30 mL of the solution.  Scarpa's fascia was then closed with running 3-0 Vicryl suture and three  interrupted Chromic sutures were placed in the deep subcu to reapproximate  the skin edges. The skin was then closed with running subcuticular 4-0  Vicryl suture. Steri-Strips were applied and the patient was awakened and  taken  to the recovery room in stable satisfactory condition. He tolerated  the procedure well with no known intraoperative complications. Sponge,  needle and instrument counts were reportedly correct x2 at the end of the  operation.   The patient will be given a prescription for 38 Tylox and 14 Vioxx 50 mg. He  will followup in my office in two weeks for recheck. I will contact him with  the results from pathology and scheduled a CT scan if indicated.                                               Mark C. Vernie Ammons, M.D.    MCO/MEDQ  D:  09/04/2002  T:  09/05/2002  Job:  [id number]

## 2010-09-10 NOTE — Op Note (Signed)
Jeffrey [name], Jeffrey [name]              ACCOUNT NO.:  [id number]   MEDICAL RECORD NO.:  [id number]          PATIENT TYPE:  AMB   LOCATION:  [facility]SC                          FACILITY:  [facility]   PHYSICIAN:  Loreta Ave, M.D. DATE OF BIRTH:  [birth year]   DATE OF PROCEDURE:  07/01/2004  DATE OF DISCHARGE:                                 OPERATIVE REPORT   PREOPERATIVE DIAGNOSIS:  Medial meniscus tear, left knee.   POSTOPERATIVE DIAGNOSIS:  Medial meniscus tear, left knee.   PROCEDURE:  Left knee examination under anesthesia, arthroscopy, partial  medial meniscectomy.   SURGEON:  Loreta Ave, M.D.   ASSISTANT:  Genene Churn. Denton Meek.   ANESTHESIA:  Knee block with sedation.   SPECIMENS:  None.   CULTURES:  None.   COMPLICATIONS:  None.   DRESSINGS:  Soft compressive.   TOURNIQUET TIME:  50 minutes.   DESCRIPTION OF PROCEDURE:  The patient was brought to the operating room and  placed on the operating table in the supine position. After adequate  anesthesia had been obtained,  tourniquet and leg holder were applied,  prepped and draped in the usual sterile fashion. Left knee had been examined  with full motion and good stability.  Three portals were created; 1  superolateral, 1 each  medial and lateral parapatellar.  Inflow catheter  introduced, knee was distended, arthroscope introduced, knee inspected.   A little focal grade 2 change of the lateral patellar facet, treated with  chondroplasty.  Marked reactive synovitis debrided. Remaining knee examined.  Articular cartilage looked quite good throughout.  Medial meniscus extensive  complex tearing with fragments folded underneath the meniscus.  Posterior  half removed after a stable rim.  Tapered into the remaining meniscus. All  recess examined, all loose fragments removed. Cruciate ligaments intact.  Good  patellofemoral tracking.  Lateral meniscus, lateral compartment looked good.  Instruments and fluid removed.  Portals  of knee injected with Marcaine.  Portals were closed with 4-0 nylon. Sterile, compressive dressing was  applied. Anesthesia reversed, brought to the recovery room, tolerated  surgery well, no complications.      DFM/MEDQ  D:  07/01/2004  T:  07/01/2004  Job:  [id number]

## 2010-09-10 NOTE — Op Note (Signed)
NAME:  Jeffrey [name], Jeffrey [name]                        ACCOUNT NO.:  [id number]   MEDICAL RECORD NO.:  [id number]                   PATIENT TYPE:  AMB   LOCATION:  ENDO                                 FACILITY:  [facility]   PHYSICIAN:  Anselmo Rod, M.D.               DATE OF BIRTH:  October 10, 1955   DATE OF PROCEDURE:  09/17/2003  DATE OF DISCHARGE:                                 OPERATIVE REPORT   PROCEDURE:  Screening colonoscopy.   ENDOSCOPIST:  Charna Elizabeth, M.D.   INSTRUMENT USED:  Olympus video colonoscope.   INDICATIONS FOR PROCEDURE:  55 year old white male undergoing screening  colonoscopy to rule out colonic polyps, masses, etc.   PREPROCEDURE PREPARATION:  Informed consent was procured from the patient.  The patient was fasted for eight hours prior to the procedure and prepped  with a bottle of magnesium citrate and a gallon of GoLYTELY the night prior  to the procedure.   PREPROCEDURE PHYSICAL:  Patient with stable vital signs.  Neck supple.  Chest clear to auscultation.  S1 and S2 regular.  Abdomen soft with normal  bowel sounds.   DESCRIPTION OF PROCEDURE:  The patient was placed in the left lateral  decubitus position, sedated with 75 mg of Demerol and 4 mg Versed  intravenously.  Once the patient was adequately sedated, maintained on low  flow oxygen and continuous cardiac monitoring, the Olympus video colonoscope  was advanced from the rectum to the cecum with difficulty.  There was some  residual stool in the colon, multiple washes were done.  The patient's  position was changed from the left lateral to the supine position, with  gentle application of abdominal pressure, to reach the cecal base.  The  appendiceal orifice and ileocecal valve were clearly visualized and  photographed.  No masses, polyps, erosions, ulcerations, or diverticula were  identified.  The terminal ileum appeared heathy and without lesions.  Retroflexion in the rectum revealed small internal  hemorrhoids.   IMPRESSION:  Unrevealing colonoscopy to the terminal ileum except for small  internal hemorrhoids.   RECOMMENDATIONS:  1. Continue a high fiber diet with liberal fluid intake.  2. Repeat CRC screening in the next ten years unless the patient develops     any abnormal symptoms in the interim.  3. Outpatient follow up as need arises in the future.                                               Anselmo Rod, M.D.    JNM/MEDQ  D:  09/17/2003  T:  09/17/2003  Job:  [id number]   cc:   Marjory Lies, M.D.  [address]  [place]  [place] [address]  Fax: [phone]

## 2010-09-10 NOTE — Procedures (Signed)
[facility]  Patient:    Jeffrey [name], PENTECOST Visit Number: [id number] MRN: [id number]          Service Type: DSU Location: CARD Attending Physician:  Caleb Popp Dictated by:   Charlcie Cradle Delford Field, M.D. [facility] Proc. Date: 12/26/00 Admit Date:  12/26/2000                             Procedure Report  INDICATION:  Right upper lobe persisting infiltrate with cavitation.  BRONCHOSCOPIST:  Charlcie Cradle. Delford Field, M.D. [facility]  ANESTHESIA:  1% Xylocaine local.  PREOPERATIVE MEDICATION:  Demerol 40 mg IV push, Versed 5 mg IV push  DESCRIPTION OF PROCEDURE:  The Pentax bronchoscope was introduced through the right naris.  The upper airways were visualized and unremarkable.  The entire tracheobronchial tree was visualized and revealed diffuse tracheobronchitis. Edema was seen in the right upper lobe orifice, however, no endobronchial lesions were seen.  Attention was then paid to the right upper lobe. Bronchioloalveolar lavage was obtained from the right upper lobe anterior segment.  100 cc volume was infused and approximately 35 cc volume returned. The patient then underwent transbronchial biopsies five times from right upper lobe.  COMPLICATIONS:  None.  IMPRESSION:  Right upper lobe infiltrate with cavitation.  RECOMMENDATIONS:  Followup with microbiology and pathology.Dictated by: Charlcie Cradle Delford Field, M.D. [facility] Attending Physician:  Caleb Popp DD:  12/26/00 TD:  12/26/00 Job: [id number]

## 2010-09-10 NOTE — Progress Notes (Signed)
Quick Note:  Pt informed on personally identified VM. Will wait to see if he calls back for referral. ______

## 2010-09-13 ENCOUNTER — Telehealth: Payer: Self-pay | Admitting: *Deleted

## 2010-09-13 DIAGNOSIS — R200 Anesthesia of skin: Secondary | ICD-10-CM

## 2010-09-13 NOTE — Telephone Encounter (Signed)
Pt would like MRI as recommended

## 2010-09-13 NOTE — Telephone Encounter (Signed)
Pt already had MRI.  Do you mean neurology referral?  OK to refer to [facility] to evaluate R thigh numbness.

## 2010-12-16 ENCOUNTER — Other Ambulatory Visit: Payer: Self-pay | Admitting: Family Medicine

## 2011-01-03 ENCOUNTER — Other Ambulatory Visit: Payer: Self-pay | Admitting: *Deleted

## 2011-01-03 DIAGNOSIS — G4700 Insomnia, unspecified: Secondary | ICD-10-CM

## 2011-01-03 NOTE — Telephone Encounter (Signed)
Refill for 6 months. 

## 2011-01-03 NOTE — Telephone Encounter (Signed)
[facility] refill request for Klonopin, take one at [facility] prn.  Last filled on 07/12/10, #90 with 1 refill.  Pt here last in May 2012

## 2011-01-04 MED ORDER — CLONAZEPAM 1 MG PO TABS: 1.0000 mg | ORAL_TABLET | Freq: Every evening | ORAL | Status: DC | PRN

## 2011-02-16 ENCOUNTER — Ambulatory Visit (INDEPENDENT_AMBULATORY_CARE_PROVIDER_SITE_OTHER): Payer: BC Managed Care – PPO | Admitting: Family Medicine

## 2011-02-16 ENCOUNTER — Encounter: Payer: Self-pay | Admitting: Family Medicine

## 2011-02-16 VITALS — BP 110/80 | Temp 99.0°F | Wt 230.0 lb

## 2011-02-16 DIAGNOSIS — J209 Acute bronchitis, unspecified: Secondary | ICD-10-CM

## 2011-02-16 DIAGNOSIS — Z23 Encounter for immunization: Secondary | ICD-10-CM

## 2011-02-16 MED ORDER — HYDROCOD POLST-CHLORPHEN POLST 10-8 MG/5ML PO LQCR: 5.0000 mL | Freq: Two times a day (BID) | ORAL | Status: DC | PRN

## 2011-02-16 NOTE — Progress Notes (Signed)
  Subjective:    Patient ID: Jeffrey [name], male    DOB: 05/14/1955, 55 y.o.   MRN: [id number]  HPI  Three week history of upper history congestion and cough. Cough mostly nonproductive. Frequent postnasal drip symptoms. Not relieved with Allegra. Previously had good relief with Tussionex. Patient is nonsmoker. Quit smoking back in 1979. Denies any facial pain or purulent nasal secretions. No fever or chills.   Review of Systems  Constitutional: Negative for fever, chills, appetite change and unexpected weight change.  HENT: Positive for congestion and postnasal drip. Negative for sore throat, trouble swallowing and voice change.   Respiratory: Positive for cough. Negative for shortness of breath and wheezing.   Cardiovascular: Negative for chest pain.       Objective:   Physical Exam  Constitutional: He appears well-developed and well-nourished.  HENT:  Right Ear: External ear normal.  Left Ear: External ear normal.  Mouth/Throat: Oropharynx is clear and moist.  Neck: Neck supple.  Cardiovascular: Normal rate and regular rhythm.   Pulmonary/Chest: Effort normal and breath sounds normal. No respiratory distress. He has no wheezes. He has no rales.  Lymphadenopathy:    He has no cervical adenopathy.          Assessment & Plan:  Cough with associated postnasal drip. No evidence for acute bacterial infection.  Tussionex 1 teaspoon each bedtime. Flu vaccine given. Follow up as needed

## 2011-06-13 ENCOUNTER — Other Ambulatory Visit (INDEPENDENT_AMBULATORY_CARE_PROVIDER_SITE_OTHER): Payer: BC Managed Care – PPO

## 2011-06-13 DIAGNOSIS — Z0000 Encounter for general adult medical examination without abnormal findings: Secondary | ICD-10-CM

## 2011-06-13 LAB — CBC WITH DIFFERENTIAL/PLATELET
Basophils Absolute: 0 10*3/uL (ref 0.0–0.1)
Basophils Relative: 0.9 % (ref 0.0–3.0)
HCT: 45.4 % (ref 39.0–52.0)
Hemoglobin: 15.5 g/dL (ref 13.0–17.0)
Lymphs Abs: 1.2 10*3/uL (ref 0.7–4.0)
MCHC: 34.2 g/dL (ref 30.0–36.0)
Monocytes Relative: 7.9 % (ref 3.0–12.0)
Neutro Abs: 2.7 10*3/uL (ref 1.4–7.7)
RDW: 12.4 % (ref 11.5–14.6)

## 2011-06-13 LAB — HEPATIC FUNCTION PANEL
AST: 23 U/L (ref 0–37)
Albumin: 4.5 g/dL (ref 3.5–5.2)
Total Protein: 6.7 g/dL (ref 6.0–8.3)

## 2011-06-13 LAB — BASIC METABOLIC PANEL
CO2: 29 mEq/L (ref 19–32)
GFR: 94.56 mL/min (ref >60.00)
Glucose, Bld: 75 mg/dL (ref 70–99)
Potassium: 4.5 mEq/L (ref 3.5–5.1)
Sodium: 141 mEq/L (ref 135–145)

## 2011-06-13 LAB — POCT URINALYSIS DIPSTICK
Bilirubin, UA: NEGATIVE
Blood, UA: NEGATIVE
Glucose, UA: NEGATIVE
Leukocytes, UA: NEGATIVE
Nitrite, UA: NEGATIVE
Urobilinogen, UA: 0.2

## 2011-06-13 LAB — LIPID PANEL
Cholesterol: 183 mg/dL (ref 0–200)
VLDL: 22.8 mg/dL (ref 0.0–40.0)

## 2011-06-13 LAB — PSA: PSA: 0.5 ng/mL (ref 0.10–4.00)

## 2011-06-17 ENCOUNTER — Ambulatory Visit (INDEPENDENT_AMBULATORY_CARE_PROVIDER_SITE_OTHER): Payer: BC Managed Care – PPO | Admitting: Family Medicine

## 2011-06-17 ENCOUNTER — Encounter: Payer: Self-pay | Admitting: Family Medicine

## 2011-06-17 VITALS — BP 115/70 | HR 80 | Temp 98.1°F | Resp 12 | Ht 79.0 in | Wt 237.0 lb

## 2011-06-17 DIAGNOSIS — Z0000 Encounter for general adult medical examination without abnormal findings: Secondary | ICD-10-CM

## 2011-06-17 DIAGNOSIS — G4700 Insomnia, unspecified: Secondary | ICD-10-CM

## 2011-06-17 MED ORDER — CITALOPRAM HYDROBROMIDE 20 MG PO TABS: 20.0000 mg | ORAL_TABLET | Freq: Every day | ORAL | Status: DC

## 2011-06-17 MED ORDER — CLONAZEPAM 1 MG PO TABS: 1.0000 mg | ORAL_TABLET | Freq: Every evening | ORAL | Status: DC | PRN

## 2011-06-17 NOTE — Progress Notes (Signed)
  Subjective:    Patient ID: Jeffrey [name], male    DOB: [birth year], 56 y.o.   MRN: [id number]  HPI  Patient here for complete physical examination. Past medical history significant for transient atrial fibrillation, chronic anxiety, chronic insomnia. Medications reviewed. Anxiety symptoms remain stable on Celexa. Takes Klonopin at night. Tetanus up to date. Colonoscopy up to date. Exercises somewhat inconsistently.  Past Medical History  Diagnosis Date  . History of atrial flutter   . AF (atrial fibrillation)    Past Surgical History  Procedure Date  . Othroscopic knee surgery bil   . Vericose veins stripped both legs   . Left testicle removed (no cancer, benign mass)     reports that he quit smoking about 33 years ago. His smoking use included Cigarettes. He has a 12 pack-year smoking history. He does not have any smokeless tobacco history on file. He reports that he does not drink alcohol. His drug history not on file. family history includes Alcohol abuse in his father and mother; Arthritis in his maternal grandmother; and Cancer in his sister. Allergies  Allergen Reactions  . Bee Venom     shock      Review of Systems  Constitutional: Negative for fever, activity change, appetite change and fatigue.  HENT: Negative for ear pain, congestion and trouble swallowing.   Eyes: Negative for pain and visual disturbance.  Respiratory: Negative for cough, shortness of breath and wheezing.   Cardiovascular: Negative for chest pain and palpitations.  Gastrointestinal: Negative for nausea, vomiting, abdominal pain, diarrhea, constipation, blood in stool, abdominal distention and rectal pain.  Genitourinary: Negative for dysuria, hematuria and testicular pain.  Musculoskeletal: Negative for joint swelling and arthralgias.  Skin: Negative for rash.  Neurological: Negative for dizziness, syncope and headaches.  Hematological: Negative for adenopathy.  Psychiatric/Behavioral: Negative  for confusion and dysphoric mood.       Objective:   Physical Exam  Constitutional: He is oriented to person, place, and time. He appears well-developed and well-nourished. No distress.  HENT:  Head: Normocephalic and atraumatic.  Right Ear: External ear normal.  Left Ear: External ear normal.  Mouth/Throat: Oropharynx is clear and moist.  Eyes: Conjunctivae and EOM are normal. Pupils are equal, round, and reactive to light.  Neck: Normal range of motion. Neck supple. No thyromegaly present.  Cardiovascular: Normal rate, regular rhythm and normal heart sounds.   No murmur heard. Pulmonary/Chest: Breath sounds normal. No respiratory distress. He has no wheezes. He has no rales.  Abdominal: Soft. Bowel sounds are normal. He exhibits no distension and no mass. There is no tenderness. There is no rebound and no guarding.  Musculoskeletal: He exhibits no edema.  Lymphadenopathy:    He has no cervical adenopathy.  Neurological: He is alert and oriented to person, place, and time. He displays normal reflexes. No cranial nerve deficit.  Skin: No rash noted.  Psychiatric: He has a normal mood and affect.          Assessment & Plan:  Complete physical. Discussed importance of regular exercise. Immunizations up to date. Labs reviewed with patient with no major concerns. Refilled citalopram for one year and Klonopin for 6 months

## 2011-09-06 ENCOUNTER — Encounter (HOSPITAL_BASED_OUTPATIENT_CLINIC_OR_DEPARTMENT_OTHER): Payer: Self-pay | Admitting: Emergency Medicine

## 2011-09-06 ENCOUNTER — Emergency Department (INDEPENDENT_AMBULATORY_CARE_PROVIDER_SITE_OTHER): Payer: BC Managed Care – PPO

## 2011-09-06 ENCOUNTER — Emergency Department (HOSPITAL_BASED_OUTPATIENT_CLINIC_OR_DEPARTMENT_OTHER)
Admission: EM | Admit: 2011-09-06 | Discharge: 2011-09-06 | Disposition: A | Discharge status: Home / Self Care | Payer: BC Managed Care – PPO | Attending: Emergency Medicine | Admitting: Emergency Medicine

## 2011-09-06 DIAGNOSIS — R0789 Other chest pain: Secondary | ICD-10-CM

## 2011-09-06 DIAGNOSIS — Z79899 Other long term (current) drug therapy: Secondary | ICD-10-CM | POA: Insufficient documentation

## 2011-09-06 DIAGNOSIS — R079 Chest pain, unspecified: Secondary | ICD-10-CM

## 2011-09-06 LAB — DIFFERENTIAL
Basophils Absolute: 0.1 10*3/uL (ref 0.0–0.1)
Eosinophils Relative: 7 % — ABNORMAL HIGH (ref 0–5)
Lymphocytes Relative: 21 % (ref 12–46)
Lymphs Abs: 1.3 10*3/uL (ref 0.7–4.0)
Monocytes Absolute: 0.6 10*3/uL (ref 0.1–1.0)
Monocytes Relative: 9 % (ref 3–12)
Neutro Abs: 4 10*3/uL (ref 1.7–7.7)

## 2011-09-06 LAB — CBC
HCT: 42.2 % (ref 39.0–52.0)
Hemoglobin: 15.4 g/dL (ref 13.0–17.0)
MCV: 85.8 fL (ref 78.0–100.0)
RBC: 4.92 MIL/uL (ref 4.22–5.81)
RDW: 12.3 % (ref 11.5–15.5)
WBC: 6.4 10*3/uL (ref 4.0–10.5)

## 2011-09-06 LAB — BASIC METABOLIC PANEL
CO2: 29 mEq/L (ref 19–32)
Calcium: 9.4 mg/dL (ref 8.4–10.5)
Chloride: 100 mEq/L (ref 96–112)
Creatinine, Ser: 0.9 mg/dL (ref 0.50–1.35)
Glucose, Bld: 113 mg/dL — ABNORMAL HIGH (ref 70–99)

## 2011-09-06 LAB — TROPONIN I: Troponin I: <0.3 ng/mL (ref <0.30)

## 2011-09-06 LAB — PROTIME-INR
INR: 1.07 (ref 0.00–1.49)
Prothrombin Time: 14.1 seconds (ref 11.6–15.2)

## 2011-09-06 MED ORDER — MORPHINE SULFATE 4 MG/ML IJ SOLN: 4.0000 mg | Freq: Once | INTRAMUSCULAR | Status: DC

## 2011-09-06 MED ORDER — ASPIRIN 81 MG PO CHEW
324.0000 mg | CHEWABLE_TABLET | Freq: Once | ORAL | Status: AC
  Administered 2011-09-06: 81 mg via ORAL
  Filled 2011-09-06: qty 4

## 2011-09-06 NOTE — ED Provider Notes (Signed)
History     CSN: [id number]  Arrival date & time 09/06/11  1513   First MD Initiated Contact with Patient 09/06/11 1522      Chief Complaint  Patient presents with  . Chest Pain    (Consider location/radiation/quality/duration/timing/severity/associated sxs/prior treatment) HPI Comments: Hx ablation for aflutter in past. Developed left-sided reproducible chest discomfort while at work. He has no associated shortness of breath, diaphoresis, nausea. Has some associated tingling in his left fingertips it began after the pain began. No change in motor. Sensation is normal and his left hand. There is no change in sensorium elsewhere  Patient is a 56 y.o. male presenting with chest pain. The history is provided by the patient. No language interpreter was used.  Chest Pain The chest pain began 1 - 2 hours ago. Chest pain occurs constantly. The chest pain is unchanged. Associated with: no specific association. The severity of the pain is mild. The quality of the pain is described as tightness. The pain does not radiate. Exacerbated by: no exacerbating measures. Pertinent negatives for primary symptoms include no fever, no fatigue, no syncope, no shortness of breath, no cough, no wheezing, no palpitations, no abdominal pain, no nausea, no vomiting and no dizziness.  Pertinent negatives for associated symptoms include no diaphoresis, no lower extremity edema, no near-syncope, no numbness, no orthopnea, no paroxysmal nocturnal dyspnea and no weakness. He tried nothing for the symptoms. Risk factors include male gender.     Past Medical History  Diagnosis Date  . History of atrial flutter   . AF (atrial fibrillation)     Past Surgical History  Procedure Date  . Othroscopic knee surgery bil   . Vericose veins stripped both legs   . Left testicle removed (no cancer, benign mass)     Family History  Problem Relation Age of Onset  . Alcohol abuse Mother   . Alcohol abuse Father   . Arthritis  Maternal Grandmother   . Cancer Sister     breast    History  Substance Use Topics  . Smoking status: Former Smoker -- 1.5 packs/day for 8 years    Types: Cigarettes    Quit date: 07/11/1977  . Smokeless tobacco: Not on file  . Alcohol Use: No      Review of Systems  Constitutional: Negative for fever, chills, diaphoresis, activity change, appetite change and fatigue.  HENT: Negative for congestion, sore throat, rhinorrhea, neck pain and neck stiffness.   Respiratory: Positive for chest tightness. Negative for cough, shortness of breath and wheezing.   Cardiovascular: Negative for chest pain, palpitations, orthopnea, syncope and near-syncope.  Gastrointestinal: Negative for nausea, vomiting, abdominal pain and diarrhea.  Genitourinary: Negative for dysuria, urgency, frequency and flank pain.  Musculoskeletal: Negative for myalgias, back pain and arthralgias.  Neurological: Negative for dizziness, weakness, light-headedness, numbness and headaches.  Psychiatric/Behavioral: The patient is nervous/anxious.   All other systems reviewed and are negative.    Allergies  Bee venom  Home Medications   Current Outpatient Rx  Name Route Sig Dispense Refill  . ASPIRIN 325 MG PO TABS Oral Take 325 mg by mouth daily.      Marland Kitchen CITALOPRAM HYDROBROMIDE 20 MG PO TABS Oral Take 1 tablet (20 mg total) by mouth daily. 90 tablet 3  . CLONAZEPAM 1 MG PO TABS Oral Take 1 mg by mouth at bedtime as needed. For sleep    . FEXOFENADINE HCL 180 MG PO TABS Oral Take 180 mg by mouth daily.      Marland Kitchen  OMEGA-3 FATTY ACIDS 1000 MG PO CAPS Oral Take 1 g by mouth daily.    Marland Kitchen GLUCOSAMINE CHONDR COMPLEX PO Oral Take 1 tablet by mouth 2 (two) times daily.    . ADULT MULTIVITAMIN W/MINERALS CH Oral Take 1 tablet by mouth daily.    Marland Kitchen EPINEPHRINE 0.3 MG/0.3ML IJ DEVI Intramuscular Inject 0.3 mg into the muscle once.      BP 157/98  Pulse 68  Resp 20  SpO2 100%  Physical Exam  Nursing note and vitals  reviewed. Constitutional: He is oriented to person, place, and time. He appears well-developed and well-nourished.  HENT:  Head: Normocephalic and atraumatic.  Mouth/Throat: Oropharynx is clear and moist.  Eyes: Conjunctivae and EOM are normal. Pupils are equal, round, and reactive to light.  Neck: Normal range of motion. Neck supple.  Cardiovascular: Normal rate, regular rhythm, normal heart sounds and intact distal pulses.  Exam reveals no gallop and no friction rub.   No murmur heard. Pulmonary/Chest: Effort normal and breath sounds normal. No respiratory distress. He exhibits tenderness (L chest wall tenderness).  Abdominal: Soft. Bowel sounds are normal. There is no tenderness. There is no rebound and no guarding.  Musculoskeletal: Normal range of motion. He exhibits no tenderness.  Neurological: He is alert and oriented to person, place, and time. He has normal strength. No cranial nerve deficit or sensory deficit.  Skin: Skin is warm and dry.    ED Course  Procedures (including critical care time)   Date: 09/06/2011  Rate: 66  Rhythm: normal sinus rhythm  QRS Axis: normal  Intervals: normal  ST/T Wave abnormalities: normal  Conduction Disutrbances:none  Narrative Interpretation:   Old EKG Reviewed: unchanged  Labs Reviewed  CBC - Abnormal; Notable for the following:    MCHC 36.5 (*)    All other components within normal limits  DIFFERENTIAL - Abnormal; Notable for the following:    Eosinophils Relative 7 (*)    All other components within normal limits  BASIC METABOLIC PANEL - Abnormal; Notable for the following:    Glucose, Bld 113 (*)    All other components within normal limits  PROTIME-INR  TROPONIN I  TROPONIN I   Dg Chest 2 View  09/06/2011  *RADIOLOGY REPORT*  Clinical Data: Left chest pain  CHEST - 2 VIEW  Comparison:  06/16/2009  Findings:  The heart size and mediastinal contours are within normal limits.  Both lungs are clear.  The visualized skeletal  structures are unremarkable.  IMPRESSION: No active cardiopulmonary disease.  Original Report Authenticated By: Judie Petit. Ruel Favors, M.D.     1. Chest pain, atypical       MDM  Chest pain atypical for ACS. He had a delta troponin which was negative. I feel in the instance of low risk for cardiac-type chest pain that is if he rules ACS out as a cause of his pain. It's likely anxiety related. I have no concerns about pulmonary embolus as the cause of his pain. Chest x-ray and workup was unremarkable. He'll be discharged home with instructions to followup with his primary care physician in one week.        Dayton Bailiff, MD 09/06/11 1843

## 2011-09-06 NOTE — ED Notes (Signed)
Onset of chest pain x also reports tingling to left hand

## 2011-09-06 NOTE — Discharge Instructions (Signed)
Chest Pain (Nonspecific) It is often hard to give a specific diagnosis for the cause of chest pain. There is always a chance that your pain could be related to something serious, such as a heart attack or a blood clot in the lungs. You need to follow up with your caregiver for further evaluation. CAUSES   Heartburn.   Pneumonia or bronchitis.   Anxiety or stress.   Inflammation around your heart (pericarditis) or lung (pleuritis or pleurisy).   A blood clot in the lung.   A collapsed lung (pneumothorax). It can develop suddenly on its own (spontaneous pneumothorax) or from injury (trauma) to the chest.   Shingles infection (herpes zoster virus).  The chest wall is composed of bones, muscles, and cartilage. Any of these can be the source of the pain.  The bones can be bruised by injury.   The muscles or cartilage can be strained by coughing or overwork.   The cartilage can be affected by inflammation and become sore (costochondritis).  DIAGNOSIS  Lab tests or other studies, such as X-rays, electrocardiography, stress testing, or cardiac imaging, may be needed to find the cause of your pain.  TREATMENT   Treatment depends on what may be causing your chest pain. Treatment may include:   Acid blockers for heartburn.   Anti-inflammatory medicine.   Pain medicine for inflammatory conditions.   Antibiotics if an infection is present.   You may be advised to change lifestyle habits. This includes stopping smoking and avoiding alcohol, caffeine, and chocolate.   You may be advised to keep your head raised (elevated) when sleeping. This reduces the chance of acid going backward from your stomach into your esophagus.   Most of the time, nonspecific chest pain will improve within 2 to 3 days with rest and mild pain medicine.  HOME CARE INSTRUCTIONS   If antibiotics were prescribed, take your antibiotics as directed. Finish them even if you start to feel better.   For the next few  days, avoid physical activities that bring on chest pain. Continue physical activities as directed.   Do not smoke.   Avoid drinking alcohol.   Only take over-the-counter or prescription medicine for pain, discomfort, or fever as directed by your caregiver.   Follow your caregiver's suggestions for further testing if your chest pain does not go away.   Keep any follow-up appointments you made. If you do not go to an appointment, you could develop lasting (chronic) problems with pain. If there is any problem keeping an appointment, you must call to reschedule.  SEEK MEDICAL CARE IF:   You think you are having problems from the medicine you are taking. Read your medicine instructions carefully.   Your chest pain does not go away, even after treatment.   You develop a rash with blisters on your chest.  SEEK IMMEDIATE MEDICAL CARE IF:   You have increased chest pain or pain that spreads to your arm, neck, jaw, back, or abdomen.   You develop shortness of breath, an increasing cough, or you are coughing up blood.   You have severe back or abdominal pain, feel nauseous, or vomit.   You develop severe weakness, fainting, or chills.   You have a fever.  THIS IS AN EMERGENCY. Do not wait to see if the pain will go away. Get medical help at once. Call your local emergency services (911 in [place] Do not drive yourself to the hospital. MAKE SURE YOU:   Understand these instructions.     Will watch your condition.   Will get help right away if you are not doing well or get worse.  Document Released: 01/19/2005 Document Revised: 03/31/2011 Document Reviewed: 11/15/2007 [employer] Patient Information 2012 [employer]. 

## 2011-09-26 ENCOUNTER — Encounter: Payer: Self-pay | Admitting: Family Medicine

## 2011-09-26 ENCOUNTER — Ambulatory Visit (INDEPENDENT_AMBULATORY_CARE_PROVIDER_SITE_OTHER): Payer: BC Managed Care – PPO | Admitting: Family Medicine

## 2011-09-26 VITALS — BP 110/70 | Temp 97.3°F | Wt 228.0 lb

## 2011-09-26 DIAGNOSIS — M546 Pain in thoracic spine: Secondary | ICD-10-CM

## 2011-09-26 MED ORDER — CYCLOBENZAPRINE HCL 10 MG PO TABS: 10.0000 mg | ORAL_TABLET | Freq: Three times a day (TID) | ORAL | Status: AC | PRN

## 2011-09-26 NOTE — Patient Instructions (Signed)
Consider topical heat and muscle massage Hold clonazepam while on muscle relaxer.

## 2011-09-26 NOTE — Progress Notes (Signed)
  Subjective:    Patient ID: Jeffrey [name], male    DOB: 1955-09-13, 56 y.o.   MRN: [id number]  HPI  3-4 week history of mid to lower thoracic back discomfort. Discomfort is bilateral and muscular in location. No spinal tenderness. No injury. Work involves frequent typing and recently has adjusted his keyboard to lower height. Pain is relatively mild and worse with shoulder abduction and abduction. No dyspnea. No cough. No fever or chills. No pleuritic pain. No nighttime pain. No appetite or weight changes. He's tried Advil without much relief. No focal weakness. Mild improvement with heat.   Review of Systems  Constitutional: Negative for fever, chills, appetite change and unexpected weight change.  Respiratory: Negative for cough and shortness of breath.   Cardiovascular: Negative for chest pain and leg swelling.  Skin: Negative for rash.  Neurological: Negative for weakness and numbness.  Hematological: Negative for adenopathy.       Objective:   Physical Exam  Constitutional: He appears well-developed and well-nourished. No distress.  Cardiovascular: Normal rate and regular rhythm.   Pulmonary/Chest: Effort normal and breath sounds normal. No respiratory distress. He has no wheezes. He has no rales.  Musculoskeletal: He exhibits no edema.       No thoracic spine tenderness. Patient has some mild parathoracic muscular tenderness bilaterally.  Neurological:       Strength full upper and lower extremities  Skin: No rash noted.          Assessment & Plan:  Bilateral thoracic pain. Suspect muscular. Trial of Flexeril 10 mg each bedtime. Recommend muscle massage and aerobic activities as tolerated. Touch base in one week if no improvement.

## 2012-01-20 ENCOUNTER — Ambulatory Visit (INDEPENDENT_AMBULATORY_CARE_PROVIDER_SITE_OTHER): Payer: BC Managed Care – PPO

## 2012-01-20 DIAGNOSIS — Z23 Encounter for immunization: Secondary | ICD-10-CM

## 2012-05-24 ENCOUNTER — Ambulatory Visit (INDEPENDENT_AMBULATORY_CARE_PROVIDER_SITE_OTHER): Payer: BC Managed Care – PPO | Admitting: Family Medicine

## 2012-05-24 ENCOUNTER — Encounter: Payer: Self-pay | Admitting: Family Medicine

## 2012-05-24 VITALS — BP 110/70 | HR 100 | Temp 98.1°F | Resp 20 | Wt 235.0 lb

## 2012-05-24 DIAGNOSIS — J069 Acute upper respiratory infection, unspecified: Secondary | ICD-10-CM

## 2012-05-24 MED ORDER — HYDROCOD POLST-CHLORPHEN POLST 10-8 MG/5ML PO LQCR: 5.0000 mL | Freq: Two times a day (BID) | ORAL | Status: DC | PRN

## 2012-05-24 NOTE — Progress Notes (Signed)
  Subjective:    Patient ID: Jeffrey [name], male    DOB: [birth year], 57 y.o.   MRN: [id number]  HPI Acute illness Onset Sunday. Initial sore throat and rhinorrhea. Now has cough.  Mostly dry.  Body aches. No fever.  Increased malaise. Increased cough at night Took Mucinex without much relief.   Review of Systems As per history of present illness    Objective:   Physical Exam  Constitutional: He appears well-developed and well-nourished. No distress.  HENT:  Right Ear: External ear normal.  Left Ear: External ear normal.  Mouth/Throat: Oropharynx is clear and moist.  Neck: Neck supple.  Cardiovascular: Normal rate and regular rhythm.   Pulmonary/Chest: Effort normal and breath sounds normal. No respiratory distress. He has no wheezes. He has no rales.  Lymphadenopathy:    He has no cervical adenopathy.          Assessment & Plan:  Viral URI.Marland Kitchen person as 1 teaspoon every 12 hours as needed for cough. He is aware of sedation.

## 2012-05-24 NOTE — Patient Instructions (Addendum)

## 2012-05-30 ENCOUNTER — Ambulatory Visit (INDEPENDENT_AMBULATORY_CARE_PROVIDER_SITE_OTHER): Payer: BC Managed Care – PPO | Admitting: Family Medicine

## 2012-05-30 ENCOUNTER — Encounter: Payer: Self-pay | Admitting: Family Medicine

## 2012-05-30 VITALS — BP 114/78 | HR 85 | Temp 98.3°F | Wt 230.0 lb

## 2012-05-30 DIAGNOSIS — J40 Bronchitis, not specified as acute or chronic: Secondary | ICD-10-CM

## 2012-05-30 MED ORDER — HYDROCOD POLST-CHLORPHEN POLST 10-8 MG/5ML PO LQCR: 5.0000 mL | Freq: Two times a day (BID) | ORAL | Status: DC | PRN

## 2012-05-30 MED ORDER — AZITHROMYCIN 250 MG PO TABS: ORAL_TABLET | ORAL | Status: DC

## 2012-05-30 NOTE — Progress Notes (Signed)
  Subjective:    Patient ID: Jeffrey [name], male    DOB: 1955/11/26, 57 y.o.   MRN: [id number]  HPI Here for 2 weeks of chest tightness and coughing up green sputum. No fevers. Using Tussionex at night.    Review of Systems  Constitutional: Negative.   HENT: Negative.   Eyes: Negative.   Respiratory: Positive for cough and chest tightness. Negative for wheezing.        Objective:   Physical Exam  Constitutional: He appears well-developed and well-nourished.  HENT:  Right Ear: External ear normal.  Left Ear: External ear normal.  Nose: Nose normal.  Mouth/Throat: Oropharynx is clear and moist.  Eyes: Conjunctivae normal are normal.  Pulmonary/Chest: Effort normal. No respiratory distress. He has no wheezes. He has no rales.       Scattered rhonchi   Lymphadenopathy:    He has no cervical adenopathy.          Assessment & Plan:  Add Mucinex

## 2012-06-15 ENCOUNTER — Other Ambulatory Visit (HOSPITAL_COMMUNITY): Payer: Self-pay | Admitting: Cardiovascular Disease

## 2012-06-15 DIAGNOSIS — Z9289 Personal history of other medical treatment: Secondary | ICD-10-CM

## 2012-06-15 DIAGNOSIS — R011 Cardiac murmur, unspecified: Secondary | ICD-10-CM

## 2012-06-15 DIAGNOSIS — R0989 Other specified symptoms and signs involving the circulatory and respiratory systems: Secondary | ICD-10-CM

## 2012-06-15 HISTORY — DX: Personal history of other medical treatment: Z92.89

## 2012-06-19 ENCOUNTER — Inpatient Hospital Stay (HOSPITAL_COMMUNITY): Admission: RE | Admit: 2012-06-19 | Payer: BC Managed Care – PPO | Source: Ambulatory Visit

## 2012-06-19 ENCOUNTER — Ambulatory Visit (HOSPITAL_COMMUNITY): Payer: BC Managed Care – PPO

## 2012-06-26 ENCOUNTER — Encounter (HOSPITAL_COMMUNITY): Payer: BC Managed Care – PPO

## 2012-06-26 ENCOUNTER — Ambulatory Visit (HOSPITAL_COMMUNITY)
Admission: RE | Admit: 2012-06-26 | Discharge: 2012-06-26 | Disposition: A | Discharge status: Home / Self Care | Payer: BC Managed Care – PPO | Source: Ambulatory Visit | Attending: Cardiovascular Disease | Admitting: Cardiovascular Disease

## 2012-06-26 DIAGNOSIS — R011 Cardiac murmur, unspecified: Secondary | ICD-10-CM

## 2012-06-26 DIAGNOSIS — Z9289 Personal history of other medical treatment: Secondary | ICD-10-CM

## 2012-06-26 DIAGNOSIS — R0989 Other specified symptoms and signs involving the circulatory and respiratory systems: Secondary | ICD-10-CM | POA: Insufficient documentation

## 2012-06-26 HISTORY — DX: Personal history of other medical treatment: Z92.89

## 2012-06-26 NOTE — Progress Notes (Signed)
2D Echo Performed 06/26/2012    Tammie Crouch, RCS  

## 2012-06-26 NOTE — Progress Notes (Signed)
Carotid Duplex Completed. Sturdivant, Rita D  

## 2012-07-06 DIAGNOSIS — R9389 Abnormal findings on diagnostic imaging of other specified body structures: Secondary | ICD-10-CM

## 2012-07-06 HISTORY — DX: Abnormal findings on diagnostic imaging of other specified body structures: R93.89

## 2012-07-12 ENCOUNTER — Other Ambulatory Visit (INDEPENDENT_AMBULATORY_CARE_PROVIDER_SITE_OTHER): Payer: BC Managed Care – PPO

## 2012-07-12 DIAGNOSIS — Z0000 Encounter for general adult medical examination without abnormal findings: Secondary | ICD-10-CM

## 2012-07-12 LAB — LIPID PANEL
HDL: 50.7 mg/dL (ref >39.00)
LDL Cholesterol: 106 mg/dL — ABNORMAL HIGH (ref 0–99)
Total CHOL/HDL Ratio: 4
VLDL: 21.4 mg/dL (ref 0.0–40.0)

## 2012-07-12 LAB — HEPATIC FUNCTION PANEL
ALT: 24 U/L (ref 0–53)
Albumin: 4.2 g/dL (ref 3.5–5.2)
Bilirubin, Direct: 0.2 mg/dL (ref 0.0–0.3)
Total Protein: 6.6 g/dL (ref 6.0–8.3)

## 2012-07-12 LAB — POCT URINALYSIS DIPSTICK
Leukocytes, UA: NEGATIVE
Nitrite, UA: NEGATIVE
Protein, UA: NEGATIVE
Urobilinogen, UA: 0.2
pH, UA: 6.5

## 2012-07-12 LAB — CBC WITH DIFFERENTIAL/PLATELET
Basophils Relative: 0.5 % (ref 0.0–3.0)
HCT: 43 % (ref 39.0–52.0)
Hemoglobin: 14.7 g/dL (ref 13.0–17.0)
Lymphocytes Relative: 23.8 % (ref 12.0–46.0)
Lymphs Abs: 1.1 10*3/uL (ref 0.7–4.0)
Monocytes Relative: 6.5 % (ref 3.0–12.0)
Neutro Abs: 3 10*3/uL (ref 1.4–7.7)
RBC: 4.8 Mil/uL (ref 4.22–5.81)

## 2012-07-12 LAB — BASIC METABOLIC PANEL
CO2: 28 mEq/L (ref 19–32)
Calcium: 9.2 mg/dL (ref 8.4–10.5)
GFR: 89.49 mL/min (ref >60.00)
Potassium: 4.1 mEq/L (ref 3.5–5.1)
Sodium: 136 mEq/L (ref 135–145)

## 2012-07-19 ENCOUNTER — Encounter: Payer: Self-pay | Admitting: Family Medicine

## 2012-07-19 ENCOUNTER — Ambulatory Visit (INDEPENDENT_AMBULATORY_CARE_PROVIDER_SITE_OTHER): Payer: BC Managed Care – PPO | Admitting: Family Medicine

## 2012-07-19 VITALS — BP 100/70 | HR 72 | Temp 99.5°F | Resp 12 | Ht 79.25 in | Wt 232.0 lb

## 2012-07-19 DIAGNOSIS — Z0000 Encounter for general adult medical examination without abnormal findings: Secondary | ICD-10-CM

## 2012-07-19 MED ORDER — EPINEPHRINE 0.3 MG/0.3ML IJ DEVI: 0.3000 mg | Freq: Once | INTRAMUSCULAR | Status: DC

## 2012-07-19 MED ORDER — FLUOCINONIDE 0.05 % EX CREA: TOPICAL_CREAM | Freq: Two times a day (BID) | CUTANEOUS | Status: DC

## 2012-07-19 MED ORDER — CITALOPRAM HYDROBROMIDE 20 MG PO TABS: 20.0000 mg | ORAL_TABLET | Freq: Every day | ORAL | Status: DC

## 2012-07-19 NOTE — Progress Notes (Signed)
  Subjective:    Patient ID: Jeffrey [name], male    DOB: [birth year], 57 y.o.   MRN: [id number]  HPI Here for complete physical. Chronic problems include history of chronic insomnia, intermittent atrial fibrillation, history of chronic anxiety. Anxiety symptoms stable on Celexa 20 mg daily. Requesting refills. He had some intermittent problems with eczema and takes topical steroid and requesting referral for that.  Colonoscopy around age 75. Tetanus up-to-date. Patient had recent evaluations per cardiology including echocardiogram, Holter monitor, and carotid Dopplers. He's not heard back from holter monitor but echo and carotid Dopplers were unremarkable. Occasional palpitations in the recent chest pains. No dizziness or syncope.  Past Medical History  Diagnosis Date  . History of atrial flutter   . AF (atrial fibrillation)    Past Surgical History  Procedure Laterality Date  . Othroscopic knee surgery bil    . Vericose veins stripped both legs    . Left testicle removed (no cancer, benign mass)      reports that he quit smoking about 35 years ago. His smoking use included Cigarettes. He has a 12 pack-year smoking history. He has never used smokeless tobacco. He reports that he does not drink alcohol or use illicit drugs. family history includes Alcohol abuse in his father and mother; Arthritis in his maternal grandmother; and Cancer in his sister. Allergies  Allergen Reactions  . Bee Venom Anaphylaxis      Review of Systems  Constitutional: Negative for fever, activity change, appetite change, fatigue and unexpected weight change.  HENT: Negative for ear pain, congestion and trouble swallowing.   Eyes: Negative for pain and visual disturbance.  Respiratory: Negative for cough, shortness of breath and wheezing.   Cardiovascular: Positive for palpitations. Negative for chest pain.  Gastrointestinal: Negative for nausea, vomiting, abdominal pain, diarrhea, constipation, blood in  stool, abdominal distention and rectal pain.  Genitourinary: Negative for dysuria, hematuria and testicular pain.  Musculoskeletal: Negative for joint swelling and arthralgias.  Skin: Negative for rash.  Neurological: Negative for dizziness, syncope, weakness and headaches.  Hematological: Negative for adenopathy.  Psychiatric/Behavioral: Negative for confusion and dysphoric mood.       Objective:   Physical Exam  Constitutional: He is oriented to person, place, and time. He appears well-developed and well-nourished. No distress.  HENT:  Head: Normocephalic and atraumatic.  Right Ear: External ear normal.  Left Ear: External ear normal.  Mouth/Throat: Oropharynx is clear and moist.  Eyes: Conjunctivae and EOM are normal. Pupils are equal, round, and reactive to light.  Neck: Normal range of motion. Neck supple. No thyromegaly present.  Cardiovascular: Normal rate, regular rhythm and normal heart sounds.   No murmur heard. Pulmonary/Chest: No respiratory distress. He has no wheezes. He has no rales.  Abdominal: Soft. Bowel sounds are normal. He exhibits no distension and no mass. There is no tenderness. There is no rebound and no guarding.  Musculoskeletal: He exhibits no edema.  Lymphadenopathy:    He has no cervical adenopathy.  Neurological: He is alert and oriented to person, place, and time. He displays normal reflexes. No cranial nerve deficit.  Skin: No rash noted.  Psychiatric: He has a normal mood and affect.          Assessment & Plan:  Complete physical. Labs reviewed. Refills given. Immunizations up to date. We'll likely need repeat colonoscopy by next year

## 2013-02-28 ENCOUNTER — Other Ambulatory Visit: Payer: Self-pay

## 2013-06-30 ENCOUNTER — Other Ambulatory Visit: Payer: Self-pay | Admitting: Family Medicine

## 2013-07-19 ENCOUNTER — Other Ambulatory Visit (INDEPENDENT_AMBULATORY_CARE_PROVIDER_SITE_OTHER): Payer: BC Managed Care – PPO

## 2013-07-19 DIAGNOSIS — Z0000 Encounter for general adult medical examination without abnormal findings: Secondary | ICD-10-CM

## 2013-07-19 LAB — BASIC METABOLIC PANEL
BUN: 10 mg/dL (ref 6–23)
CHLORIDE: 100 meq/L (ref 96–112)
CO2: 27 meq/L (ref 19–32)
CREATININE: 1 mg/dL (ref 0.4–1.5)
Calcium: 9.4 mg/dL (ref 8.4–10.5)
GFR: 82.91 mL/min (ref >60.00)
Glucose, Bld: 82 mg/dL (ref 70–99)
POTASSIUM: 4.1 meq/L (ref 3.5–5.1)
Sodium: 136 mEq/L (ref 135–145)

## 2013-07-19 LAB — HEPATIC FUNCTION PANEL
ALBUMIN: 4.6 g/dL (ref 3.5–5.2)
ALT: 20 U/L (ref 0–53)
AST: 22 U/L (ref 0–37)
Alkaline Phosphatase: 51 U/L (ref 39–117)
BILIRUBIN DIRECT: 0.1 mg/dL (ref 0.0–0.3)
TOTAL PROTEIN: 6.9 g/dL (ref 6.0–8.3)
Total Bilirubin: 0.9 mg/dL (ref 0.3–1.2)

## 2013-07-19 LAB — LIPID PANEL
CHOL/HDL RATIO: 4
Cholesterol: 183 mg/dL (ref 0–200)
HDL: 48.8 mg/dL (ref >39.00)
LDL Cholesterol: 105 mg/dL — ABNORMAL HIGH (ref 0–99)
Triglycerides: 144 mg/dL (ref 0.0–149.0)
VLDL: 28.8 mg/dL (ref 0.0–40.0)

## 2013-07-19 LAB — POCT URINALYSIS DIPSTICK
Bilirubin, UA: NEGATIVE
Glucose, UA: NEGATIVE
Ketones, UA: NEGATIVE
LEUKOCYTES UA: NEGATIVE
NITRITE UA: NEGATIVE
PH UA: 6.5
PROTEIN UA: NEGATIVE
RBC UA: NEGATIVE
Spec Grav, UA: 1.015
UROBILINOGEN UA: 0.2

## 2013-07-19 LAB — CBC WITH DIFFERENTIAL/PLATELET
BASOS PCT: 0.9 % (ref 0.0–3.0)
Basophils Absolute: 0 10*3/uL (ref 0.0–0.1)
EOS ABS: 0.4 10*3/uL (ref 0.0–0.7)
EOS PCT: 8.5 % — AB (ref 0.0–5.0)
HEMATOCRIT: 43.8 % (ref 39.0–52.0)
Hemoglobin: 15.1 g/dL (ref 13.0–17.0)
LYMPHS ABS: 1.1 10*3/uL (ref 0.7–4.0)
Lymphocytes Relative: 23.5 % (ref 12.0–46.0)
MCHC: 34.4 g/dL (ref 30.0–36.0)
MCV: 90.3 fl (ref 78.0–100.0)
MONO ABS: 0.3 10*3/uL (ref 0.1–1.0)
Monocytes Relative: 6.6 % (ref 3.0–12.0)
NEUTROS PCT: 60.5 % (ref 43.0–77.0)
Neutro Abs: 2.8 10*3/uL (ref 1.4–7.7)
PLATELETS: 170 10*3/uL (ref 150.0–400.0)
RBC: 4.85 Mil/uL (ref 4.22–5.81)
RDW: 12.9 % (ref 11.5–14.6)
WBC: 4.6 10*3/uL (ref 4.5–10.5)

## 2013-07-19 LAB — TSH: TSH: 1.56 u[IU]/mL (ref 0.35–5.50)

## 2013-07-19 LAB — PSA: PSA: 0.55 ng/mL (ref 0.10–4.00)

## 2013-07-25 ENCOUNTER — Encounter: Payer: Self-pay | Admitting: Family Medicine

## 2013-07-25 ENCOUNTER — Ambulatory Visit (INDEPENDENT_AMBULATORY_CARE_PROVIDER_SITE_OTHER): Payer: BC Managed Care – PPO | Admitting: Family Medicine

## 2013-07-25 VITALS — BP 120/66 | HR 81 | Temp 97.9°F | Ht 78.0 in | Wt 241.0 lb

## 2013-07-25 DIAGNOSIS — Z0000 Encounter for general adult medical examination without abnormal findings: Secondary | ICD-10-CM

## 2013-07-25 MED ORDER — FLUOCINONIDE 0.05 % EX CREA: TOPICAL_CREAM | Freq: Two times a day (BID) | CUTANEOUS | Status: DC

## 2013-07-25 MED ORDER — CITALOPRAM HYDROBROMIDE 20 MG PO TABS: 20.0000 mg | ORAL_TABLET | Freq: Every day | ORAL | Status: DC

## 2013-07-25 MED ORDER — EPINEPHRINE 0.3 MG/0.3ML IJ SOAJ: 0.3000 mg | Freq: Once | INTRAMUSCULAR | Status: DC

## 2013-07-25 NOTE — Progress Notes (Signed)
Pre visit review using our clinic review tool, if applicable. No additional management support is needed unless otherwise documented below in the visit note. 

## 2013-07-25 NOTE — Progress Notes (Signed)
   Subjective:    Patient ID: Jeffrey [name], male    DOB: 1955-10-06, 58 y.o.   MRN: [id number]  HPI Patient here for complete physical. He has history of atrial fibrillation which was very transient several years ago but none for several years now. Seasonal allergies. He has history of chronic anxiety and has been on Celexa for several years and doing well. He thinks had colonoscopy 10 years ago at age 1. Has not had shingles vaccine. Tetanus is up-to-date.  Family history and social history reviewed and as below  Past Medical History  Diagnosis Date  . History of atrial flutter   . AF (atrial fibrillation)    Past Surgical History  Procedure Laterality Date  . Othroscopic knee surgery bil    . Vericose veins stripped both legs    . Left testicle removed (no cancer, benign mass)      reports that he quit smoking about 36 years ago. His smoking use included Cigarettes. He has a 12 pack-year smoking history. He has never used smokeless tobacco. He reports that he does not drink alcohol or use illicit drugs. family history includes Alcohol abuse in his father and mother; Arthritis in his maternal grandmother; COPD in his father. Allergies  Allergen Reactions  . Bee Venom Anaphylaxis      Review of Systems  Constitutional: Negative for fever, activity change, appetite change and fatigue.  HENT: Negative for congestion, ear pain and trouble swallowing.   Eyes: Negative for pain and visual disturbance.  Respiratory: Negative for cough, shortness of breath and wheezing.   Cardiovascular: Negative for chest pain and palpitations.  Gastrointestinal: Negative for nausea, vomiting, abdominal pain, diarrhea, constipation, blood in stool, abdominal distention and rectal pain.  Genitourinary: Negative for dysuria, hematuria and testicular pain.  Musculoskeletal: Negative for arthralgias and joint swelling.  Skin: Negative for rash.  Neurological: Negative for dizziness, syncope and  headaches.  Hematological: Negative for adenopathy.  Psychiatric/Behavioral: Negative for confusion and dysphoric mood.       Objective:   Physical Exam  Constitutional: He is oriented to person, place, and time. He appears well-developed and well-nourished. No distress.  HENT:  Head: Normocephalic and atraumatic.  Right Ear: External ear normal.  Left Ear: External ear normal.  Mouth/Throat: Oropharynx is clear and moist.  Eyes: Conjunctivae and EOM are normal. Pupils are equal, round, and reactive to light.  Neck: Normal range of motion. Neck supple. No thyromegaly present.  Cardiovascular: Normal rate, regular rhythm and normal heart sounds.   No murmur heard. Pulmonary/Chest: No respiratory distress. He has no wheezes. He has no rales.  Abdominal: Soft. Bowel sounds are normal. He exhibits no distension and no mass. There is no tenderness. There is no rebound and no guarding.  Musculoskeletal: He exhibits no edema.  Lymphadenopathy:    He has no cervical adenopathy.  Neurological: He is alert and oriented to person, place, and time. He displays normal reflexes. No cranial nerve deficit.  Skin: No rash noted.  Psychiatric: He has a normal mood and affect.          Assessment & Plan:  Complete physical. Discussed shingles vaccine and he will check on insurance coverage. Confirm date of last colonoscopy and schedule if this has been 10 years. Refill regular medications for one year

## 2013-07-25 NOTE — Patient Instructions (Signed)
Confirm date of last colonoscopy in schedule if this is been over 10 years Consider shingles vaccine Consider yearly flu vaccine  What You Need to Know WHAT IS SHINGLES?  Shingles is a painful skin rash, often with blisters. It is also called Herpes Zoster or just Zoster.  A shingles rash usually appears on one side of the face or body and lasts from 2 to 4 weeks. Its main symptom is pain, which can be quite severe. Other symptoms of shingles can include fever, headache, chills, and upset stomach. Very rarely, a shingles infection can lead to pneumonia, hearing problems, blindness, brain inflammation (encephalitis), or death.  For about 1 person in 5, severe pain can continue even after the rash clears up. This is called post-herpetic neuralgia.  Shingles is caused by the Varicella Zoster virus. This is the same virus that causes chickenpox. Only someone who has had a case of chickenpox or rarely, has gotten chickenpox vaccine, can get shingles. The virus stays in your body. It can reappear many years later to cause a case of shingles.  You cannot catch shingles from another person with shingles. However, a person who has never had chickenpox (or chickenpox vaccine) could get chickenpox from someone with shingles. This is not very common.  Shingles is far more common in people 19 and older than in younger people. It is also more common in people whose immune systems are weakened because of a disease such as cancer or drugs such as steroids or chemotherapy.  At least 1 million people get shingles per year in the [place]. SHINGLES VACCINE  A vaccine for shingles was licensed in 1324. In clinical trials, the vaccine reduced the risk of shingles by 50%. It can also reduce the pain in people who still get shingles after being vaccinated.  A single dose of shingles vaccine is recommended for adults 61 years of age and older. SOME PEOPLE SHOULD NOT GET SHINGLES VACCINE OR SHOULD WAIT A  person should not get shingles vaccine if he or she:  Has ever had a life-threatening allergic reaction to gelatin, the antibiotic neomycin, or any other component of shingles vaccine. Tell your caregiver if you have any severe allergies.  Has a weakened immune system because of current:  AIDS or another disease that affects the immune system.  Treatment with drugs that affect the immune system, such as prolonged use of high-dose steroids.  Cancer treatment, such as radiation or chemotherapy.  Cancer affecting the bone marrow or lymphatic system, such as leukemia or lymphoma.  Is pregnant, or might be pregnant. Women should not become pregnant until at least 4 weeks after getting shingles vaccine. Someone with a minor illness, such as a cold, may be vaccinated. Anyone with a moderate or severe acute illness should usually wait until he or she recovers before getting the vaccine. This includes anyone with a temperature of 101.3 F (38 C) or higher. WHAT ARE THE RISKS FROM SHINGLES VACCINE?  A vaccine, like any medicine, could possibly cause serious problems, such as severe allergic reactions. However, the risk of a vaccine causing serious harm, or death, is extremely small.  No serious problems have been identified with shingles vaccine. Mild Problems  Redness, soreness, swelling, or itching at the site of the injection (about 1 person in 3).  Headache (about 1 person in 74). Like all vaccines, shingles vaccine is being closely monitored for unusual or severe problems. WHAT IF THERE IS A MODERATE OR SEVERE REACTION? What should I  look for? Any unusual condition, such as a severe allergic reaction or a high fever. If a severe allergic reaction occurred, it would be within a few minutes to an hour after the shot. Signs of a serious allergic reaction can include difficulty breathing, weakness, hoarseness or wheezing, a fast heartbeat, hives, dizziness, paleness, or swelling of the  throat. What should I do?  Call your caregiver, or get the person to a caregiver right away.  Tell the caregiver what happened, the date and time it happened, and when the vaccination was given.  Ask the caregiver to report the reaction by filing a Vaccine Adverse Event Reporting System (VAERS) form. Or, you can file this report through the VAERS web site at www.vaers.SamedayNews.es or by calling 7542537879. VAERS does not provide medical advice. HOW CAN I LEARN MORE?  Ask your caregiver. He or she can give you the vaccine package insert or suggest other sources of information.  Contact the Centers for Disease Control and Prevention (CDC):  Call 747-467-6193 (1-800-CDC-INFO).  Visit the CDC website at http://hunter.com/ CDC Shingles Vaccine VIS (01/29/08) Document Released: 02/06/2006 Document Revised: 07/04/2011 Document Reviewed: 08/01/2012 [facility] Patient Information 2014 [place].

## 2014-01-17 ENCOUNTER — Ambulatory Visit (INDEPENDENT_AMBULATORY_CARE_PROVIDER_SITE_OTHER): Payer: BC Managed Care – PPO

## 2014-01-17 DIAGNOSIS — Z23 Encounter for immunization: Secondary | ICD-10-CM

## 2014-05-11 ENCOUNTER — Other Ambulatory Visit: Payer: Self-pay | Admitting: Family Medicine

## 2014-06-26 ENCOUNTER — Other Ambulatory Visit: Payer: Self-pay | Admitting: Family Medicine

## 2014-07-30 ENCOUNTER — Other Ambulatory Visit (INDEPENDENT_AMBULATORY_CARE_PROVIDER_SITE_OTHER): Payer: BLUE CROSS/BLUE SHIELD

## 2014-07-30 DIAGNOSIS — Z0000 Encounter for general adult medical examination without abnormal findings: Secondary | ICD-10-CM | POA: Diagnosis not present

## 2014-07-30 LAB — HEPATIC FUNCTION PANEL
ALBUMIN: 4.4 g/dL (ref 3.5–5.2)
ALT: 18 U/L (ref 0–53)
AST: 19 U/L (ref 0–37)
Alkaline Phosphatase: 56 U/L (ref 39–117)
Bilirubin, Direct: 0.2 mg/dL (ref 0.0–0.3)
Total Bilirubin: 0.7 mg/dL (ref 0.2–1.2)
Total Protein: 6.8 g/dL (ref 6.0–8.3)

## 2014-07-30 LAB — BASIC METABOLIC PANEL
BUN: 10 mg/dL (ref 6–23)
CO2: 30 meq/L (ref 19–32)
Calcium: 9.5 mg/dL (ref 8.4–10.5)
Chloride: 102 mEq/L (ref 96–112)
Creatinine, Ser: 1.01 mg/dL (ref 0.40–1.50)
GFR: 79.8 mL/min (ref >60.00)
GLUCOSE: 85 mg/dL (ref 70–99)
Potassium: 4.2 mEq/L (ref 3.5–5.1)
SODIUM: 136 meq/L (ref 135–145)

## 2014-07-30 LAB — LIPID PANEL
CHOLESTEROL: 161 mg/dL (ref 0–200)
HDL: 54.1 mg/dL (ref >39.00)
LDL Cholesterol: 86 mg/dL (ref 0–99)
NonHDL: 106.9
TRIGLYCERIDES: 103 mg/dL (ref 0.0–149.0)
Total CHOL/HDL Ratio: 3
VLDL: 20.6 mg/dL (ref 0.0–40.0)

## 2014-07-30 LAB — TSH: TSH: 1.96 u[IU]/mL (ref 0.35–4.50)

## 2014-07-30 LAB — CBC WITH DIFFERENTIAL/PLATELET
BASOS PCT: 0.9 % (ref 0.0–3.0)
Basophils Absolute: 0 10*3/uL (ref 0.0–0.1)
EOS PCT: 9 % — AB (ref 0.0–5.0)
Eosinophils Absolute: 0.4 10*3/uL (ref 0.0–0.7)
HCT: 44.1 % (ref 39.0–52.0)
Hemoglobin: 15.4 g/dL (ref 13.0–17.0)
LYMPHS ABS: 1.2 10*3/uL (ref 0.7–4.0)
LYMPHS PCT: 27 % (ref 12.0–46.0)
MCHC: 34.8 g/dL (ref 30.0–36.0)
MCV: 88.2 fl (ref 78.0–100.0)
MONOS PCT: 6.9 % (ref 3.0–12.0)
Monocytes Absolute: 0.3 10*3/uL (ref 0.1–1.0)
NEUTROS ABS: 2.5 10*3/uL (ref 1.4–7.7)
NEUTROS PCT: 56.2 % (ref 43.0–77.0)
PLATELETS: 188 10*3/uL (ref 150.0–400.0)
RBC: 5 Mil/uL (ref 4.22–5.81)
RDW: 12.4 % (ref 11.5–15.5)
WBC: 4.4 10*3/uL (ref 4.0–10.5)

## 2014-07-30 LAB — PSA: PSA: 0.56 ng/mL (ref 0.10–4.00)

## 2014-08-04 ENCOUNTER — Ambulatory Visit (INDEPENDENT_AMBULATORY_CARE_PROVIDER_SITE_OTHER): Payer: BLUE CROSS/BLUE SHIELD | Admitting: Family Medicine

## 2014-08-04 ENCOUNTER — Encounter: Payer: Self-pay | Admitting: Family Medicine

## 2014-08-04 VITALS — BP 120/78 | HR 70 | Temp 98.0°F | Ht 78.0 in | Wt 237.0 lb

## 2014-08-04 DIAGNOSIS — Z23 Encounter for immunization: Secondary | ICD-10-CM

## 2014-08-04 DIAGNOSIS — Z0000 Encounter for general adult medical examination without abnormal findings: Secondary | ICD-10-CM | POA: Diagnosis not present

## 2014-08-04 NOTE — Addendum Note (Signed)
Addended by: Marcina Millard on: 08/04/2014 10:47 AM   Modules accepted: Orders

## 2014-08-04 NOTE — Patient Instructions (Signed)
Consider reducing Citalopram to one half tablet for 2 weeks and then discontinue.

## 2014-08-04 NOTE — Progress Notes (Signed)
   Subjective:    Patient ID: Jeffrey [name], male    DOB: Jan 27, 1956, 59 y.o.   MRN: [id number]  HPI   Here for complete physical. Generally very healthy. Remote history of atrial fibrillation. He had ablation procedure several years ago and has done well since that time. Remains on aspirin. Past history of anxiety treated with Celexa. He would like to consider tapering off. No depression issues. No history of shingles vaccine. Colonoscopy up-to-date. Other immunizations up-to-date.  Past Medical History  Diagnosis Date  . History of atrial flutter   . AF (atrial fibrillation)    Past Surgical History  Procedure Laterality Date  . Othroscopic knee surgery bil    . Vericose veins stripped both legs    . Left testicle removed (no cancer, benign mass)      reports that he quit smoking about 37 years ago. His smoking use included Cigarettes. He has a 12 pack-year smoking history. He has never used smokeless tobacco. He reports that he does not drink alcohol or use illicit drugs. family history includes Alcohol abuse in his father and mother; Arthritis in his maternal grandmother; COPD in his father. Allergies  Allergen Reactions  . Bee Venom Anaphylaxis      Review of Systems  Constitutional: Negative for fever, activity change, appetite change and fatigue.  HENT: Negative for congestion, ear pain and trouble swallowing.   Eyes: Negative for pain and visual disturbance.  Respiratory: Negative for cough, shortness of breath and wheezing.   Cardiovascular: Negative for chest pain and palpitations.  Gastrointestinal: Negative for nausea, vomiting, abdominal pain, diarrhea, constipation, blood in stool, abdominal distention and rectal pain.  Genitourinary: Negative for dysuria, hematuria and testicular pain.  Musculoskeletal: Negative for joint swelling and arthralgias.  Skin: Negative for rash.  Neurological: Negative for dizziness, syncope and headaches.  Hematological: Negative  for adenopathy.  Psychiatric/Behavioral: Negative for confusion and dysphoric mood.       Objective:   Physical Exam  Constitutional: He is oriented to person, place, and time. He appears well-developed and well-nourished. No distress.  HENT:  Head: Normocephalic and atraumatic.  Right Ear: External ear normal.  Left Ear: External ear normal.  Mouth/Throat: Oropharynx is clear and moist.  Eyes: Conjunctivae and EOM are normal. Pupils are equal, round, and reactive to light.  Neck: Normal range of motion. Neck supple. No thyromegaly present.  Cardiovascular: Normal rate, regular rhythm and normal heart sounds.   No murmur heard. Pulmonary/Chest: No respiratory distress. He has no wheezes. He has no rales.  Abdominal: Soft. Bowel sounds are normal. He exhibits no distension and no mass. There is no tenderness. There is no rebound and no guarding.  Musculoskeletal: He exhibits no edema.  Lymphadenopathy:    He has no cervical adenopathy.  Neurological: He is alert and oriented to person, place, and time. He displays normal reflexes. No cranial nerve deficit.  Skin: No rash noted.  Psychiatric: He has a normal mood and affect.          Assessment & Plan:  Complete physical. Patient requesting shingles vaccine. No contraindications and this will be given. Labs reviewed. No major concerns. His lipids have improved. Continue aspirin one daily. Colonoscopy up-to-date

## 2014-08-04 NOTE — Progress Notes (Signed)
Pre visit review using our clinic review tool, if applicable. No additional management support is needed unless otherwise documented below in the visit note. 

## 2014-08-10 ENCOUNTER — Other Ambulatory Visit: Payer: Self-pay | Admitting: Family Medicine

## 2014-11-04 ENCOUNTER — Encounter: Payer: Self-pay | Admitting: *Deleted

## 2014-12-25 ENCOUNTER — Encounter: Payer: Self-pay | Admitting: Cardiology

## 2015-02-19 ENCOUNTER — Ambulatory Visit (INDEPENDENT_AMBULATORY_CARE_PROVIDER_SITE_OTHER): Payer: BLUE CROSS/BLUE SHIELD

## 2015-02-19 DIAGNOSIS — Z23 Encounter for immunization: Secondary | ICD-10-CM

## 2015-06-29 ENCOUNTER — Telehealth: Payer: Self-pay | Admitting: Family Medicine

## 2015-06-29 NOTE — Telephone Encounter (Signed)
[place] with me if ok with Bruce. Please schedule 30 minute transfer visit. Thanks.

## 2015-06-29 NOTE — Telephone Encounter (Signed)
Pt would like to switch to dr kim from dr burchette requesting a younger md. Can I sch?

## 2015-07-07 NOTE — Telephone Encounter (Signed)
I thought i had already given my OK.  Definitely OK with me.

## 2015-07-23 ENCOUNTER — Ambulatory Visit (INDEPENDENT_AMBULATORY_CARE_PROVIDER_SITE_OTHER): Payer: BLUE CROSS/BLUE SHIELD | Admitting: Family Medicine

## 2015-07-23 ENCOUNTER — Encounter: Payer: Self-pay | Admitting: Family Medicine

## 2015-07-23 VITALS — BP 110/76 | HR 82 | Temp 98.1°F | Ht 78.0 in | Wt 247.9 lb

## 2015-07-23 DIAGNOSIS — I4891 Unspecified atrial fibrillation: Secondary | ICD-10-CM | POA: Diagnosis not present

## 2015-07-23 DIAGNOSIS — F411 Generalized anxiety disorder: Secondary | ICD-10-CM | POA: Diagnosis not present

## 2015-07-23 DIAGNOSIS — Z91030 Bee allergy status: Secondary | ICD-10-CM

## 2015-07-23 DIAGNOSIS — M25569 Pain in unspecified knee: Secondary | ICD-10-CM

## 2015-07-23 DIAGNOSIS — J309 Allergic rhinitis, unspecified: Secondary | ICD-10-CM | POA: Insufficient documentation

## 2015-07-23 DIAGNOSIS — G8929 Other chronic pain: Secondary | ICD-10-CM

## 2015-07-23 HISTORY — DX: Bee allergy status: Z91.030

## 2015-07-23 HISTORY — DX: Other chronic pain: G89.29

## 2015-07-23 MED ORDER — CITALOPRAM HYDROBROMIDE 10 MG PO TABS: 10.0000 mg | ORAL_TABLET | Freq: Every day | ORAL | Status: DC

## 2015-07-23 NOTE — Patient Instructions (Addendum)
BEFORE YOU LEAVE: -keep your physical as scheduled  Wean off of the celelxa, 10 mg sent to pharmacy. 10mg  daily for 2 weeks, then every other day for 1-2 weeks, then stop.  Advise cut back on alcohol use.  We recommend the following healthy lifestyle measures: - eat a healthy whole foods diet consisting of regular small meals composed of vegetables, fruits, beans, nuts, seeds, healthy meats such as white chicken and fish and whole grains.  - avoid sweets, white starchy foods, fried foods, fast food, processed foods, sodas, red meet and other fattening foods.  - get a least 150-300 minutes of aerobic exercise per week.

## 2015-07-23 NOTE — Progress Notes (Signed)
HPI:  Jeffrey [name] is here to establish care.  Last PCP and physical:  Has the following chronic problems that require follow up and concerns today:  Chronic Knee pain: -seeing Dr. Percell Miller for this  -no weakness, catching, giving away  GAD: -has been on celexa for many years -feels like he could wean off of this now -denies any history of depression, panic, SI, manic symptoms  Hx of A. Fibrillation: -s/p ablation -reports cardiologist told him to continue asa -no episodes since ablation -exercises regularly (gym 3 days per week) -no CP, SOB, DOE, palpitations, bleeding, hx bleeding  Hx bee sting allergy: -hx anaphylaxis -carries epipen  Alcohol use: -3-4 drinks daily -no hx dependence, abuse or complications  ROS negative for unless reported above: fevers, unintentional weight loss, hearing or vision loss, chest pain, palpitations, struggling to breath, hemoptysis, melena, hematochezia, hematuria, falls, loc, si, thoughts of self harm  Past Medical History  Diagnosis Date  . History of atrial flutter   . AF (atrial fibrillation) ([place])   . Normal cardiac stress test 05-24-2007    low risk scan  . Abnormal Doppler ultrasound of carotid artery 07-06-2012    normal carotid doppler  . History of cardiac monitoring 06-15-2012    sinus   . H/O prior ablation treatment 2011  . H/O echocardiogram 06-26-2012    Transthoracic echo    EF 55-60%  . Hx of bee sting allergy 07/23/2015  . Chronic knee pain 07/23/2015    Past Surgical History  Procedure Laterality Date  . Othroscopic knee surgery bil    . Vericose veins stripped both legs    . Left testicle removed (no cancer, benign mass)      Family History  Problem Relation Age of Onset  . Alcohol abuse Mother   . Alcohol abuse Father   . COPD Father   . Arthritis Maternal Grandmother     Social History   Social History  . Marital Status: Married    Spouse Name: N/A  . Number of Children: N/A  . Years of  Education: N/A   Occupational History  . Manager    Social History Main Topics  . Smoking status: Former Smoker -- 1.50 packs/day for 8 years    Types: Cigarettes    Quit date: 07/11/1977  . Smokeless tobacco: Never Used  . Alcohol Use: 12.6 oz/week    21 Standard drinks or equivalent per week  . Drug Use: No  . Sexual Activity: Not Asked   Other Topics Concern  . None   Social History Narrative   Work or School: [employer], [place] Situation: lives with wife      Spiritual Beliefs: none      Lifestyle: gym 3 days per week; diet is healthy           Current outpatient prescriptions:  .  aspirin 325 MG tablet, Take 325 mg by mouth daily.  , Disp: , Rfl:  .  EPINEPHrine (EPIPEN) 0.3 mg/0.3 mL DEVI, Inject 0.3 mLs (0.3 mg total) into the muscle once., Disp: 1 Device, Rfl: 1 .  EPINEPHrine (EPIPEN) 0.3 mg/0.3 mL SOAJ injection, Inject 0.3 mLs (0.3 mg total) into the muscle once., Disp: 2 Device, Rfl: 1 .  fexofenadine (ALLEGRA) 180 MG tablet, Take 180 mg by mouth daily.  , Disp: , Rfl:  .  fish oil-omega-3 fatty acids 1000 MG capsule, Take 1 g by mouth daily., Disp: , Rfl:  .  fluocinonide cream (LIDEX) 0.05 %, Apply topically 2 (two) times daily., Disp: 60 g, Rfl: 1 .  Glucosamine-Chondroitin (GLUCOSAMINE CHONDR COMPLEX PO), Take 1 tablet by mouth 2 (two) times daily., Disp: , Rfl:  .  Multiple Vitamin (MULITIVITAMIN WITH MINERALS) TABS, Take 1 tablet by mouth daily., Disp: , Rfl:  .  citalopram (CELEXA) 10 MG tablet, Take 1 tablet (10 mg total) by mouth daily., Disp: 30 tablet, Rfl: 0  EXAM:  Filed Vitals:   07/23/15 1501  BP: 110/76  Pulse: 82  Temp: 98.1 F (36.7 C)    Body mass index is 28.65 kg/(m^2).  GENERAL: vitals reviewed and listed above, alert, oriented, appears well hydrated and in no acute distress  HEENT: atraumatic, conjunttiva clear, no obvious abnormalities on inspection of external nose and ears  NECK: no obvious masses on  inspection  LUNGS: clear to auscultation bilaterally, no wheezes, rales or rhonchi, good air movement  CV: HRRR, no peripheral edema  MS: moves all extremities without noticeable abnormality  PSYCH: pleasant and cooperative, no obvious depression or anxiety  ASSESSMENT AND PLAN:  Discussed the following assessment and plan:  Chronic knee pain, unspecified laterality - -sees Dr. Percell Miller  Atrial fibrillation, unspecified type ([place]) - -s/p ablation  GAD (generalized anxiety disorder)  Allergic rhinitis, unspecified allergic rhinitis type  Hx of bee sting allergy  -he plans to taper of celexa, rx sent and instructions for taper provided -Advised to taper down on alcohol use -Needs physical, he has already scheduled this -We reviewed the PMH, PSH, FH, SH, Meds and Allergies. -We provided refills for any medications we will prescribe as needed. -We addressed current concerns per orders and patient instructions. -We have asked for records for pertinent exams, studies, vaccines and notes from previous providers. -We have advised patient to follow up per instructions below.   -Patient advised to return or notify a doctor immediately if symptoms worsen or persist or new concerns arise.  Patient Instructions  BEFORE YOU LEAVE: -keep your physical as scheduled  Wean off of the celelxa, 10 mg sent to pharmacy. 10mg  daily for 2 weeks, then every other day for 1-2 weeks, then stop.  Advise cut back on alcohol use.  We recommend the following healthy lifestyle measures: - eat a healthy whole foods diet consisting of regular small meals composed of vegetables, fruits, beans, nuts, seeds, healthy meats such as white chicken and fish and whole grains.  - avoid sweets, white starchy foods, fried foods, fast food, processed foods, sodas, red meet and other fattening foods.  - get a least 150-300 minutes of aerobic exercise per week.       Colin Benton R.

## 2015-07-23 NOTE — Progress Notes (Signed)
Pre visit review using our clinic review tool, if applicable. No additional management support is needed unless otherwise documented below in the visit note. 

## 2015-08-06 ENCOUNTER — Encounter: Payer: BLUE CROSS/BLUE SHIELD | Admitting: Family Medicine

## 2015-08-11 ENCOUNTER — Encounter: Payer: Self-pay | Admitting: Family Medicine

## 2015-08-11 ENCOUNTER — Ambulatory Visit (INDEPENDENT_AMBULATORY_CARE_PROVIDER_SITE_OTHER): Payer: BLUE CROSS/BLUE SHIELD | Admitting: Family Medicine

## 2015-08-11 VITALS — BP 100/70 | HR 77 | Temp 98.3°F | Ht 79.0 in | Wt 248.2 lb

## 2015-08-11 DIAGNOSIS — Z0000 Encounter for general adult medical examination without abnormal findings: Secondary | ICD-10-CM | POA: Diagnosis not present

## 2015-08-11 LAB — LIPID PANEL
CHOLESTEROL: 196 mg/dL (ref 0–200)
HDL: 50.7 mg/dL (ref >39.00)
LDL CALC: 118 mg/dL — AB (ref 0–99)
NonHDL: 144.8
Total CHOL/HDL Ratio: 4
Triglycerides: 132 mg/dL (ref 0.0–149.0)
VLDL: 26.4 mg/dL (ref 0.0–40.0)

## 2015-08-11 LAB — PSA: PSA: 0.49 ng/mL (ref 0.10–4.00)

## 2015-08-11 LAB — HEMOGLOBIN A1C: HEMOGLOBIN A1C: 5.2 % (ref 4.6–6.5)

## 2015-08-11 NOTE — Patient Instructions (Signed)
Before you leave: -Labs -Follow up yearly for your physical exam  We recommend the following healthy lifestyle measures: - eat a healthy whole foods diet consisting of regular small meals composed of vegetables, fruits, beans, nuts, seeds, healthy meats such as white chicken and fish and whole grains.  - avoid sweets, white starchy foods, fried foods, fast food, processed foods, sodas, red meet and other fattening foods.  - get a least 150-300 minutes of aerobic exercise per week.   -We have ordered labs or studies at this visit. It can take up to 1-2 weeks for results and processing. We will contact you with instructions IF your results are abnormal. Normal results will be released to your [facility]. If you have not heard from [place] or can not find your results in [facility] in 2 weeks please contact our office.

## 2015-08-11 NOTE — Progress Notes (Signed)
HPI:  Here for CPE:  -Concerns and/or follow up today:   Chronic Knee pain: -seeing Dr. Percell Miller for this; has appointment soon to discuss -no weakness, catching, giving away  GAD: -has been on celexa for many years -denies any history of depression, panic, SI, manic symptoms  Hx of A. Fibrillation: -s/p ablation -reports cardiologist told him to continue asa -no episodes since ablation -exercises regularly (gym 3 days per week) -no CP, SOB, DOE, palpitations, bleeding, hx bleeding  Hx bee sting allergy: -hx anaphylaxis -carries epipen  Alcohol use: -3-4 drinks daily -no hx dependence, abuse or complications  -Diet: variety of foods, balance and well rounded  -Exercise: reports regular exercise  -Diabetes and Dyslipidemia Screening: Fasting for labs today  -Vaccines: UTD  -sexual activity: yes, male partner, no new partners  -wants STI testing, Hep C screening (if born 37-1967): Once to do one time hep C screening, declines other testing  -FH colon or prstate ca: see FH Last colon cancer screening: He reports is up-to-date, done at age 53 per his report and normal Last prostate ca screening: Last year with PSA, we discussed risk and benefits and he declines DRE but does wish to do the PSA testing yearly.  -Alcohol, Tobacco, drug use: see social history  Review of Systems - no fevers, unintentional weight loss, vision loss, hearing loss, chest pain, sob, hemoptysis, melena, hematochezia, hematuria, genital discharge, changing or concerning skin lesions, bleeding, bruising, loc, thoughts of self harm or SI  Past Medical History  Diagnosis Date  . History of atrial flutter   . AF (atrial fibrillation) ([place])   . Normal cardiac stress test 05-24-2007    low risk scan  . Abnormal Doppler ultrasound of carotid artery 07-06-2012    normal carotid doppler  . History of cardiac monitoring 06-15-2012    sinus   . H/O prior ablation treatment 2011  . H/O  echocardiogram 06-26-2012    Transthoracic echo    EF 55-60%  . Hx of bee sting allergy 07/23/2015  . Chronic knee pain 07/23/2015    Past Surgical History  Procedure Laterality Date  . Othroscopic knee surgery bil    . Vericose veins stripped both legs    . Left testicle removed (no cancer, benign mass)      Family History  Problem Relation Age of Onset  . Alcohol abuse Mother   . Alcohol abuse Father   . COPD Father   . Arthritis Maternal Grandmother     Social History   Social History  . Marital Status: Married    Spouse Name: N/A  . Number of Children: N/A  . Years of Education: N/A   Occupational History  . Manager    Social History Main Topics  . Smoking status: Former Smoker -- 1.50 packs/day for 8 years    Types: Cigarettes    Quit date: 07/11/1977  . Smokeless tobacco: Never Used  . Alcohol Use: 12.6 oz/week    21 Standard drinks or equivalent per week  . Drug Use: No  . Sexual Activity: Not Asked   Other Topics Concern  . None   Social History Narrative   Work or School: [employer], [place] Situation: lives with wife      Spiritual Beliefs: none      Lifestyle: gym 3 days per week; diet is healthy           Current outpatient prescriptions:  .  aspirin 325 MG  tablet, Take 325 mg by mouth daily.  , Disp: , Rfl:  .  citalopram (CELEXA) 10 MG tablet, Take 1 tablet (10 mg total) by mouth daily., Disp: 30 tablet, Rfl: 0 .  EPINEPHrine (EPIPEN) 0.3 mg/0.3 mL DEVI, Inject 0.3 mLs (0.3 mg total) into the muscle once., Disp: 1 Device, Rfl: 1 .  EPINEPHrine (EPIPEN) 0.3 mg/0.3 mL SOAJ injection, Inject 0.3 mLs (0.3 mg total) into the muscle once., Disp: 2 Device, Rfl: 1 .  fexofenadine (ALLEGRA) 180 MG tablet, Take 180 mg by mouth daily.  , Disp: , Rfl:  .  fish oil-omega-3 fatty acids 1000 MG capsule, Take 1 g by mouth daily., Disp: , Rfl:  .  fluocinonide cream (LIDEX) 0.05 %, Apply topically 2 (two) times daily., Disp: 60 g, Rfl: 1 .   Glucosamine-Chondroitin (GLUCOSAMINE CHONDR COMPLEX PO), Take 1 tablet by mouth 2 (two) times daily., Disp: , Rfl:  .  Multiple Vitamin (MULITIVITAMIN WITH MINERALS) TABS, Take 1 tablet by mouth daily., Disp: , Rfl:   EXAM:  Filed Vitals:   08/11/15 1134  BP: 100/70  Pulse: 77  Temp: 98.3 F (36.8 C)  TempSrc: Oral  Height: 6\' 7"  (2.007 m)  Weight: 248 lb 3.2 oz (112.583 kg)    Estimated body mass index is 27.95 kg/(m^2) as calculated from the following:   Height as of this encounter: 6\' 7"  (2.007 m).   Weight as of this encounter: 248 lb 3.2 oz (112.583 kg).  GENERAL: vitals reviewed and listed below, alert, oriented, appears well hydrated and in no acute distress  HEENT: head atraumatic, PERRLA, normal appearance of eyes, ears, nose and mouth. moist mucus membranes.  NECK: supple, no masses or lymphadenopathy  LUNGS: clear to auscultation bilaterally, no rales, rhonchi or wheeze  CV: HRRR, no peripheral edema or cyanosis, normal pedal pulses  ABDOMEN: bowel sounds normal, soft, non tender to palpation, no masses, no rebound or guarding  GU: Client  SKIN: no rashes or abnormal lesions  MS: normal gait, moves all extremities normally  NEURO: CN II-XII grossly intact, normal muscle strength and sensation to light touch on extremities  PSYCH: normal affect, pleasant and cooperative  ASSESSMENT AND PLAN:  Discussed the following assessment and plan:  Visit for preventive health examination - Plan: PSA, Lipid panel, Hemoglobin A1c, Hep C Antibody   -Discussed and advised all [place] preventive services health task force level A and B recommendations for age, sex and risks.  -Advised at least 150 minutes of exercise per week and a healthy diet low in saturated fats and sweets and consisting of fresh fruits and vegetables, lean meats such as fish and white chicken and whole grains.  -FASTING labs, studies and vaccines per orders this encounter   Patient advised to return  to clinic immediately if symptoms worsen or persist or new concerns.  Patient Instructions  Before you leave: -Labs -Follow up yearly for your physical exam  We recommend the following healthy lifestyle measures: - eat a healthy whole foods diet consisting of regular small meals composed of vegetables, fruits, beans, nuts, seeds, healthy meats such as white chicken and fish and whole grains.  - avoid sweets, white starchy foods, fried foods, fast food, processed foods, sodas, red meet and other fattening foods.  - get a least 150-300 minutes of aerobic exercise per week.   -We have ordered labs or studies at this visit. It can take up to 1-2 weeks for results and processing. We will contact you with instructions  IF your results are abnormal. Normal results will be released to your [facility]. If you have not heard from [place] or can not find your results in [facility] in 2 weeks please contact our office.           No Follow-up on file.   Colin Benton R.

## 2015-08-11 NOTE — Progress Notes (Signed)
Pre visit review using our clinic review tool, if applicable. No additional management support is needed unless otherwise documented below in the visit note. 

## 2015-08-12 LAB — HEPATITIS C ANTIBODY: HCV Ab: NEGATIVE

## 2015-08-21 ENCOUNTER — Telehealth: Payer: Self-pay | Admitting: *Deleted

## 2015-08-21 DIAGNOSIS — I340 Nonrheumatic mitral (valve) insufficiency: Secondary | ICD-10-CM

## 2015-08-21 DIAGNOSIS — I4891 Unspecified atrial fibrillation: Secondary | ICD-10-CM

## 2015-08-21 DIAGNOSIS — Z9889 Other specified postprocedural states: Secondary | ICD-10-CM

## 2015-08-21 DIAGNOSIS — I4892 Unspecified atrial flutter: Secondary | ICD-10-CM

## 2015-08-21 DIAGNOSIS — Z8679 Personal history of other diseases of the circulatory system: Secondary | ICD-10-CM

## 2015-08-21 NOTE — Telephone Encounter (Signed)
I called the pt and informed him Dr Maudie Mercury received a letter requesting clearance for his surgery.  Dr Maudie Mercury stated due to the history of mitral regurg, atrial flutter and Afib status post ablation and his being on aspirin she would recommend he see a cardiologist for clearance.  He stated he was seen by [facility] a few years ago and does not want to go back there and asked to be referred somewhere else. Dr Maudie Mercury approved a referral and this was placed and he is aware someone will call with appt info.

## 2015-09-13 NOTE — Progress Notes (Signed)
Electrophysiology Office Note   Date:  09/14/2015   ID:  [name] Jeffrey, DOB Jan 28, 1956, MRN [id number]  PCP:  Lucretia Kern., DO  Primary Electrophysiologist:  Constance Haw, MD    Chief Complaint  Patient presents with  . Advice Only  . Atrial Flutter     History of Present Illness: Jeffrey [name] is a 60 y.o. male who presents today for electrophysiology evaluation.   He has a hsitory of atrial fibrillation/flutter s/p ablation, and mitral regurgitation.  He was initially planning a partial replacement of his right knee, but says that he does not feel like this is necessary at this time. He had an A. fib and potentially flutter ablation 6-7 years ago at [facility] with Dr. Lehman Prom. He was having some dizziness with his atrial fibrillation. He was initially controlled well on Multaq, but decided that he did not want medical management for his atrial fibrillation and preferred procedures. Since his ablation, he is felt well without any major complaints. He is able to do all of his daily activities without shortness of breath, chest pain, or fatigue. He says that he does not feel like he has been in atrial fibrillation since his ablation.  Today, he denies symptoms of palpitations, chest pain, shortness of breath, orthopnea, PND, lower extremity edema, claudication, dizziness, presyncope, syncope, bleeding, or neurologic sequela. The patient is tolerating medications without difficulties and is otherwise without complaint today.    Past Medical History  Diagnosis Date  . History of atrial flutter   . AF (atrial fibrillation) ([place])   . Normal cardiac stress test 05-24-2007    low risk scan  . Abnormal Doppler ultrasound of carotid artery 07-06-2012    normal carotid doppler  . History of cardiac monitoring 06-15-2012    sinus   . H/O prior ablation treatment 2011  . H/O echocardiogram 06-26-2012    Transthoracic echo    EF 55-60%  . Hx of bee sting allergy 07/23/2015  . Chronic  knee pain 07/23/2015   Past Surgical History  Procedure Laterality Date  . Othroscopic knee surgery bil    . Vericose veins stripped both legs    . Left testicle removed (no cancer, benign mass)       Current Outpatient Prescriptions  Medication Sig Dispense Refill  . aspirin 325 MG tablet Take 325 mg by mouth daily.      . citalopram (CELEXA) 20 MG tablet Take 20 mg by mouth daily.    Marland Kitchen EPINEPHrine (EPIPEN) 0.3 mg/0.3 mL DEVI Inject 0.3 mLs (0.3 mg total) into the muscle once. 1 Device 1  . fexofenadine (ALLEGRA) 180 MG tablet Take 180 mg by mouth daily.      . fish oil-omega-3 fatty acids 1000 MG capsule Take 1 g by mouth daily.    . fluocinonide cream (LIDEX) 0.05 % Apply topically 2 (two) times daily. 60 g 1  . Glucosamine-Chondroitin (GLUCOSAMINE CHONDR COMPLEX PO) Take 1 tablet by mouth 2 (two) times daily.    . Multiple Vitamin (MULITIVITAMIN WITH MINERALS) TABS Take 1 tablet by mouth daily.     No current facility-administered medications for this visit.    Allergies:   Bee venom   Social History:  The patient  reports that he quit smoking about 38 years ago. His smoking use included Cigarettes. He has a 12 pack-year smoking history. He has never used smokeless tobacco. He reports that he drinks about 12.6 oz of alcohol per week. He reports that he  does not use illicit drugs.   Family History:  The patient's family history includes Alcohol abuse in his father and mother; Arthritis in his maternal grandmother; COPD in his father.    ROS:  Please see the history of present illness.   Otherwise, review of systems is positive for none.   All other systems are reviewed and negative.    PHYSICAL EXAM: VS:  BP 110/78 mmHg  Pulse 68  Ht 6\' 6"  (1.981 m)  Wt 247 lb (112.038 kg)  BMI 28.55 kg/m2 , BMI Body mass index is 28.55 kg/(m^2). GEN: Well nourished, well developed, in no acute distress HEENT: normal Neck: no JVD, carotid bruits, or masses Cardiac: RRR; no murmurs, rubs,  or gallops,no edema  Respiratory:  clear to auscultation bilaterally, normal work of breathing GI: soft, nontender, nondistended, + BS MS: no deformity or atrophy Skin: warm and dry Neuro:  Strength and sensation are intact Psych: euthymic mood, full affect  EKG:  EKG is ordered today. The ekg ordered today shows sinus rhythm, rate 68, IRBBB  Recent Labs: No results found for requested labs within last 365 days.    Lipid Panel     Component Value Date/Time   CHOL 196 08/11/2015 1211   TRIG 132.0 08/11/2015 1211   HDL 50.70 08/11/2015 1211   CHOLHDL 4 08/11/2015 1211   VLDL 26.4 08/11/2015 1211   LDLCALC 118* 08/11/2015 1211     Wt Readings from Last 3 Encounters:  09/14/15 247 lb (112.038 kg)  08/11/15 248 lb 3.2 oz (112.583 kg)  07/23/15 247 lb 14.4 oz (112.447 kg)      Other studies Reviewed: Additional studies/ records that were reviewed today include: TTE 2014  Review of the above records today demonstrates:  - Left ventricle: The cavity size was normal. Wall thickness was normal. Systolic function was normal. The estimated ejection fraction was in the range of 55% to 60%. Wall motion was normal; there were no regional wall motion abnormalities. Left ventricular diastolic function parameters were normal. - Mitral valve: Calcified annulus.   ASSESSMENT AND PLAN:  1.  Atrial fibrillation: s/p ablation on aspirin.  Has a low stroke risk, and therefore does not require anticoagulation. He says that he has not had any atrial fibrillation since his ablation. He is potentially plan for a right knee operation. As he is able to do all his daily activities without chest pain, shortness of breath, PND, or orthopnea, and has not had any further atrial fibrillation, he would be at low to intermediate risk for a intermediate risk procedure, should he decide on surgery. I have offered to follow him for his atrial fibrillation yearly, but he says that he would rather  call back if he has any further issues. We'll see him back on an as-needed basis.  This patients CHA2DS2-VASc Score and unadjusted Ischemic Stroke Rate (% per year) is equal to 0.2 % stroke rate/year from a score of 0  Above score calculated as 1 point each if present [CHF, HTN, DM, Vascular=MI/PAD/Aortic Plaque, Age if 65-74, or Male] Above score calculated as 2 points each if present [Age > 75, or Stroke/TIA/TE]       Current medicines are reviewed at length with the patient today.   The patient has no concerns regarding his medicines.  The following changes were made today:  none  Labs/ tests ordered today include:  No orders of the defined types were placed in this encounter.     Disposition:   FU  with Drinda Belgard Curt Bears, PRN  Signed, Tatsuo Musial Meredith Leeds, MD  09/14/2015 9:43 AM     [facility] [address] [place] [address] [phone] (office) [phone] (fax)

## 2015-09-14 ENCOUNTER — Ambulatory Visit (INDEPENDENT_AMBULATORY_CARE_PROVIDER_SITE_OTHER): Payer: BLUE CROSS/BLUE SHIELD | Admitting: Cardiology

## 2015-09-14 ENCOUNTER — Encounter: Payer: Self-pay | Admitting: Cardiology

## 2015-09-14 VITALS — BP 110/78 | HR 68 | Ht 78.0 in | Wt 247.0 lb

## 2015-09-14 DIAGNOSIS — I480 Paroxysmal atrial fibrillation: Secondary | ICD-10-CM

## 2015-09-14 NOTE — Patient Instructions (Signed)
Medication Instructions:  Your physician recommends that you continue on your current medications as directed. Please refer to the Current Medication list given to you today.  Lab work: None ordered  Testing/Procedures: None ordered  Follow-Up: No follow up is needed at this time with Dr. Camnitz.  He will see you on an as needed basis.   Thank you for choosing [facility]!!   Brenee Gajda, RN [phone]     

## 2015-09-18 ENCOUNTER — Other Ambulatory Visit (HOSPITAL_COMMUNITY): Payer: BLUE CROSS/BLUE SHIELD

## 2015-09-30 ENCOUNTER — Inpatient Hospital Stay: Admit: 2015-09-30 | Payer: BLUE CROSS/BLUE SHIELD | Admitting: Orthopedic Surgery

## 2015-09-30 SURGERY — ARTHROPLASTY, KNEE, TOTAL
Anesthesia: General | Laterality: Right

## 2015-10-05 ENCOUNTER — Other Ambulatory Visit: Payer: Self-pay | Admitting: Family Medicine

## 2015-12-16 ENCOUNTER — Other Ambulatory Visit: Payer: Self-pay | Admitting: *Deleted

## 2015-12-16 MED ORDER — CITALOPRAM HYDROBROMIDE 10 MG PO TABS: 10.0000 mg | ORAL_TABLET | Freq: Every day | ORAL | 1 refills | Status: DC

## 2016-01-18 ENCOUNTER — Encounter: Payer: Self-pay | Admitting: Family Medicine

## 2016-01-18 ENCOUNTER — Ambulatory Visit (INDEPENDENT_AMBULATORY_CARE_PROVIDER_SITE_OTHER): Payer: BLUE CROSS/BLUE SHIELD | Admitting: Family Medicine

## 2016-01-18 VITALS — BP 100/72 | HR 77 | Temp 98.3°F | Ht 78.0 in | Wt 237.5 lb

## 2016-01-18 DIAGNOSIS — J069 Acute upper respiratory infection, unspecified: Secondary | ICD-10-CM

## 2016-01-18 DIAGNOSIS — G8929 Other chronic pain: Secondary | ICD-10-CM | POA: Diagnosis not present

## 2016-01-18 DIAGNOSIS — Z23 Encounter for immunization: Secondary | ICD-10-CM | POA: Diagnosis not present

## 2016-01-18 DIAGNOSIS — M25569 Pain in unspecified knee: Secondary | ICD-10-CM

## 2016-01-18 MED ORDER — EPINEPHRINE 0.3 MG/0.3ML IJ SOAJ: 0.3000 mg | Freq: Once | INTRAMUSCULAR | 1 refills | Status: AC

## 2016-01-18 MED ORDER — HYDROCOD POLST-CPM POLST ER 10-8 MG/5ML PO SUER: 5.0000 mL | Freq: Every evening | ORAL | 0 refills | Status: DC | PRN

## 2016-01-18 NOTE — Patient Instructions (Signed)
BEFORE YOU LEAVE: -follow up: if symptoms worsening or not resolved over the next 1-2 weeks -flu shot -prescription

## 2016-01-18 NOTE — Progress Notes (Signed)
HPI:   URI: -started 3 weeks ago -nasal congestion, sore throat initially, now just cough - mucus was green/yellow, but now clear -reports knows is cold but tussinex has helped in the past when cough lingers -denies: CP, SOB, wheezing, sinus pain, fevers  OA knees: -seeing dr. Percell Miller  -s/p arthroscopy and reports partial knee replacement advise -he is trying acupuncture and this is helping - wants my thoughts -denies weakness, giving away, intolerable or life altering symptoms    ROS: See pertinent positives and negatives per HPI.  Past Medical History:  Diagnosis Date  . Abnormal Doppler ultrasound of carotid artery 07-06-2012   normal carotid doppler  . AF (atrial fibrillation) ([place])   . Chronic knee pain 07/23/2015  . H/O echocardiogram 06-26-2012   Transthoracic echo    EF 55-60%  . H/O prior ablation treatment 2011  . History of atrial flutter   . History of cardiac monitoring 06-15-2012   sinus   . Hx of bee sting allergy 07/23/2015  . Normal cardiac stress test 05-24-2007   low risk scan    Past Surgical History:  Procedure Laterality Date  . left testicle removed (no cancer, benign mass)    . othroscopic knee surgery bil    . vericose veins stripped both legs      Family History  Problem Relation Age of Onset  . Alcohol abuse Mother   . Alcohol abuse Father   . COPD Father   . Arthritis Maternal Grandmother     Social History   Social History  . Marital status: Married    Spouse name: N/A  . Number of children: N/A  . Years of education: N/A   Occupational History  . Manager    Social History Main Topics  . Smoking status: Former Smoker    Packs/day: 1.50    Years: 8.00    Types: Cigarettes    Quit date: 07/11/1977  . Smokeless tobacco: Never Used  . Alcohol use 12.6 oz/week    21 Standard drinks or equivalent per week  . Drug use: No  . Sexual activity: Not Asked   Other Topics Concern  . None   Social History Narrative   Work or  School: [employer], [place] Situation: lives with wife      Spiritual Beliefs: none      Lifestyle: gym 3 days per week; diet is healthy           Current Outpatient Prescriptions:  .  aspirin 325 MG tablet, Take 325 mg by mouth daily.  , Disp: , Rfl:  .  citalopram (CELEXA) 10 MG tablet, Take 1 tablet (10 mg total) by mouth daily., Disp: 90 tablet, Rfl: 1 .  EPINEPHrine (EPIPEN) 0.3 mg/0.3 mL DEVI, Inject 0.3 mLs (0.3 mg total) into the muscle once., Disp: 1 Device, Rfl: 1 .  fexofenadine (ALLEGRA) 180 MG tablet, Take 180 mg by mouth daily.  , Disp: , Rfl:  .  fish oil-omega-3 fatty acids 1000 MG capsule, Take 1 g by mouth daily., Disp: , Rfl:  .  fluocinonide cream (LIDEX) 0.05 %, Apply topically 2 (two) times daily., Disp: 60 g, Rfl: 1 .  Glucosamine-Chondroitin (GLUCOSAMINE CHONDR COMPLEX PO), Take 1 tablet by mouth 2 (two) times daily., Disp: , Rfl:  .  Multiple Vitamin (MULITIVITAMIN WITH MINERALS) TABS, Take 1 tablet by mouth daily., Disp: , Rfl:  .  chlorpheniramine-HYDROcodone (TUSSIONEX PENNKINETIC ER) 10-8 MG/5ML SUER, Take 5 mLs by  mouth at bedtime as needed for cough., Disp: 115 mL, Rfl: 0 .  EPINEPHrine 0.3 mg/0.3 mL IJ SOAJ injection, Inject 0.3 mLs (0.3 mg total) into the muscle once., Disp: 1 Device, Rfl: 1  EXAM:  Vitals:   01/18/16 0952  BP: 100/72  Pulse: 77  Temp: 98.3 F (36.8 C)    Body mass index is 27.45 kg/m.  GENERAL: vitals reviewed and listed above, alert, oriented, appears well hydrated and in no acute distress  HEENT: atraumatic, conjunttiva clear, no obvious abnormalities on inspection of external nose and ears, normal appearance of ear canals and TMs, clear nasal congestion, mild post oropharyngeal erythema with PND, no tonsillar edema or exudate, no sinus TTP  NECK: no obvious masses on inspection  LUNGS: clear to auscultation bilaterally, no wheezes, rales or rhonchi, good air movement  CV: HRRR, no peripheral edema  MS:  moves all extremities without noticeable abnormality, normal gait and normal movements climbing on exam table  PSYCH: pleasant and cooperative, no obvious depression or anxiety  ASSESSMENT AND PLAN:  Discussed the following assessment and plan:  Acute upper respiratory infection Likely viral given improving and no symptoms to suggest bacterial infection. He is not interested in further evaluation at this time. Tussionex Rx provided after discussion risks and proper use. Advised follow-up if any worsening or symptoms do not resolve in the next 1-2 weeks and with the chest x-ray and further evaluation.  Chronic knee pain, unspecified laterality If acupuncture is working, and he has the resources to pay for this, this could be useful if he feels he could delay surgery.  Encounter for immunization - Plan: Flu Vaccine QUAD 36+ mos IM  -Patient advised to return or notify a doctor immediately if symptoms worsen or persist or new concerns arise.  Patient Instructions  BEFORE YOU LEAVE: -follow up: if symptoms worsening or not resolved over the next 1-2 weeks -flu shot -prescription     Colin Benton R., DO

## 2016-01-18 NOTE — Progress Notes (Signed)
Pre visit review using our clinic review tool, if applicable. No additional management support is needed unless otherwise documented below in the visit note. 

## 2016-01-28 ENCOUNTER — Ambulatory Visit (INDEPENDENT_AMBULATORY_CARE_PROVIDER_SITE_OTHER)
Admission: RE | Admit: 2016-01-28 | Discharge: 2016-01-28 | Disposition: A | Discharge status: Home / Self Care | Payer: BLUE CROSS/BLUE SHIELD | Source: Ambulatory Visit | Attending: Family Medicine | Admitting: Family Medicine

## 2016-01-28 ENCOUNTER — Encounter: Payer: Self-pay | Admitting: Family Medicine

## 2016-01-28 ENCOUNTER — Ambulatory Visit (INDEPENDENT_AMBULATORY_CARE_PROVIDER_SITE_OTHER): Payer: BLUE CROSS/BLUE SHIELD | Admitting: Family Medicine

## 2016-01-28 VITALS — BP 112/80 | HR 66 | Temp 97.6°F | Ht 78.0 in | Wt 234.7 lb

## 2016-01-28 DIAGNOSIS — R059 Cough, unspecified: Secondary | ICD-10-CM

## 2016-01-28 DIAGNOSIS — R05 Cough: Secondary | ICD-10-CM

## 2016-01-28 MED ORDER — PREDNISONE 20 MG PO TABS: 40.0000 mg | ORAL_TABLET | Freq: Every day | ORAL | 0 refills | Status: DC

## 2016-01-28 MED ORDER — BENZONATATE 100 MG PO CAPS: 100.0000 mg | ORAL_CAPSULE | Freq: Three times a day (TID) | ORAL | 0 refills | Status: DC | PRN

## 2016-01-28 NOTE — Patient Instructions (Signed)
BEFORE YOU LEAVE: -xray sheet  Go get the xray.  Take the prednisone, two tablets, 40mg  daily for 4 days.  Tessalon as needed for cough.  Follow up if worsening, new symptoms or symptoms persists despite treatment.

## 2016-01-28 NOTE — Progress Notes (Signed)
HPI:  Acute visit for cough: -for about 10 days now -productive cough, drainage, mild wheeze or chest congestion at times -denies fevers, malaise, SOB, palpitations, CP, sinus pain, ear pain -has now run out of the tussionex  ROS: See pertinent positives and negatives per HPI.  Past Medical History:  Diagnosis Date  . Abnormal Doppler ultrasound of carotid artery 07-06-2012   normal carotid doppler  . AF (atrial fibrillation) ([place])   . Chronic knee pain 07/23/2015  . H/O echocardiogram 06-26-2012   Transthoracic echo    EF 55-60%  . H/O prior ablation treatment 2011  . History of atrial flutter   . History of cardiac monitoring 06-15-2012   sinus   . Hx of bee sting allergy 07/23/2015  . Normal cardiac stress test 05-24-2007   low risk scan    Past Surgical History:  Procedure Laterality Date  . left testicle removed (no cancer, benign mass)    . othroscopic knee surgery bil    . vericose veins stripped both legs      Family History  Problem Relation Age of Onset  . Alcohol abuse Mother   . Alcohol abuse Father   . COPD Father   . Arthritis Maternal Grandmother     Social History   Social History  . Marital status: Married    Spouse name: N/A  . Number of children: N/A  . Years of education: N/A   Occupational History  . Manager    Social History Main Topics  . Smoking status: Former Smoker    Packs/day: 1.50    Years: 8.00    Types: Cigarettes    Quit date: 07/11/1977  . Smokeless tobacco: Never Used  . Alcohol use 12.6 oz/week    21 Standard drinks or equivalent per week  . Drug use: No  . Sexual activity: Not Asked   Other Topics Concern  . None   Social History Narrative   Work or School: [employer], [place] Situation: lives with wife      Spiritual Beliefs: none      Lifestyle: gym 3 days per week; diet is healthy           Current Outpatient Prescriptions:  .  aspirin 325 MG tablet, Take 325 mg by mouth daily.  , Disp: ,  Rfl:  .  chlorpheniramine-HYDROcodone (TUSSIONEX PENNKINETIC ER) 10-8 MG/5ML SUER, Take 5 mLs by mouth at bedtime as needed for cough., Disp: 115 mL, Rfl: 0 .  citalopram (CELEXA) 10 MG tablet, Take 1 tablet (10 mg total) by mouth daily., Disp: 90 tablet, Rfl: 1 .  EPINEPHrine (EPIPEN) 0.3 mg/0.3 mL DEVI, Inject 0.3 mLs (0.3 mg total) into the muscle once., Disp: 1 Device, Rfl: 1 .  fexofenadine (ALLEGRA) 180 MG tablet, Take 180 mg by mouth daily.  , Disp: , Rfl:  .  fish oil-omega-3 fatty acids 1000 MG capsule, Take 1 g by mouth daily., Disp: , Rfl:  .  fluocinonide cream (LIDEX) 0.05 %, Apply topically 2 (two) times daily., Disp: 60 g, Rfl: 1 .  Glucosamine-Chondroitin (GLUCOSAMINE CHONDR COMPLEX PO), Take 1 tablet by mouth 2 (two) times daily., Disp: , Rfl:  .  Multiple Vitamin (MULITIVITAMIN WITH MINERALS) TABS, Take 1 tablet by mouth daily., Disp: , Rfl:  .  benzonatate (TESSALON PERLES) 100 MG capsule, Take 1 capsule (100 mg total) by mouth 3 (three) times daily as needed for cough., Disp: 30 capsule, Rfl: 0 .  predniSONE (  DELTASONE) 20 MG tablet, Take 2 tablets (40 mg total) by mouth daily with breakfast., Disp: 8 tablet, Rfl: 0  EXAM:  Vitals:   01/28/16 1614  BP: 112/80  Pulse: 66  Temp: 97.6 F (36.4 C)    Body mass index is 27.12 kg/m.  GENERAL: vitals reviewed and listed above, alert, oriented, appears well hydrated and in no acute distress  HEENT: atraumatic, conjunttiva clear, no obvious abnormalities on inspection of external nose and ears, normal appearance of ear canals and TMs, normal appearance nasal mucosa, mild post oropharyngeal erythema with PND, no tonsillar edema or exudate, no sinus TTP  NECK: no obvious masses on inspection  LUNGS: clear to auscultation bilaterally, no wheezes, rales or rhonchi, good air movement  CV: HRRR, no peripheral edema  MS: moves all extremities without noticeable abnormality  PSYCH: pleasant and cooperative, no obvious  depression or anxiety  ASSESSMENT AND PLAN:  Discussed the following assessment and plan:  Cough - Plan: DG Chest 2 View  -cxr  -suspect mild bronchitis and he opted for prednisone after discussion options, along with tessalon -Patient advised to return or notify a doctor immediately if symptoms worsen or persist or new concerns arise.  Patient Instructions  BEFORE YOU LEAVE: -xray sheet  Go get the xray.  Take the prednisone, two tablets, 40mg  daily for 4 days.  Tessalon as needed for cough.  Follow up if worsening, new symptoms or symptoms persists despite treatment.    Colin Benton R., DO

## 2016-01-28 NOTE — Progress Notes (Signed)
Pre visit review using our clinic review tool, if applicable. No additional management support is needed unless otherwise documented below in the visit note. 

## 2016-01-29 ENCOUNTER — Encounter: Payer: Self-pay | Admitting: Family Medicine

## 2016-01-29 ENCOUNTER — Telehealth: Payer: Self-pay | Admitting: Family Medicine

## 2016-01-29 DIAGNOSIS — J40 Bronchitis, not specified as acute or chronic: Secondary | ICD-10-CM | POA: Insufficient documentation

## 2016-01-29 NOTE — Telephone Encounter (Signed)
Discussed with pt

## 2016-01-29 NOTE — Telephone Encounter (Signed)
Pt would like someone to go over his xray results

## 2016-01-29 NOTE — Telephone Encounter (Signed)
See results note. 

## 2016-05-05 ENCOUNTER — Ambulatory Visit (INDEPENDENT_AMBULATORY_CARE_PROVIDER_SITE_OTHER): Payer: BLUE CROSS/BLUE SHIELD | Admitting: Family Medicine

## 2016-05-05 ENCOUNTER — Encounter: Payer: Self-pay | Admitting: Family Medicine

## 2016-05-05 VITALS — BP 100/60 | HR 83 | Temp 99.0°F | Ht 78.0 in | Wt 237.9 lb

## 2016-05-05 DIAGNOSIS — S61202A Unspecified open wound of right middle finger without damage to nail, initial encounter: Secondary | ICD-10-CM

## 2016-05-05 DIAGNOSIS — N6001 Solitary cyst of right breast: Secondary | ICD-10-CM | POA: Diagnosis not present

## 2016-05-05 MED ORDER — DOXYCYCLINE HYCLATE 100 MG PO CAPS: 100.0000 mg | ORAL_CAPSULE | Freq: Two times a day (BID) | ORAL | 0 refills | Status: DC

## 2016-05-05 NOTE — Progress Notes (Signed)
Pre visit review using our clinic review tool, if applicable. No additional management support is needed unless otherwise documented below in the visit note. 

## 2016-05-05 NOTE — Patient Instructions (Signed)
BEFORE YOU LEAVE: -follow up: 2 weeks  Take the doxycycline as instructed. Stay out of the sun while on this antibiotic.  Warm compresses several times daily.  We placed a referral for you as discussed about the hand. It usually takes about 1-2 weeks to process and schedule this referral. If you have not heard from [place] regarding this appointment in 2 weeks please contact our office.

## 2016-05-05 NOTE — Progress Notes (Signed)
HPI:  Jeffrey [name] is a pleasant 61 yo M here for an acute visit for several issues. First of all, he has a small lump in the skin the right chest wall for about 6 or more months. Recently over the last few days it has seemed to enlarge certainly and is mildly tender. He has not noticed any drainage, fevers, malaise or discharge. Second concern is a laceration of the right middle finger. This happened while slicing cucumbers on Christmas day. He cleaned the wound and it seems to be healing, but has had slow healing and he is concerned about the appearance. No pain, discharge or bleeding.  ROS: See pertinent positives and negatives per HPI.  Past Medical History:  Diagnosis Date  . Abnormal Doppler ultrasound of carotid artery 07-06-2012   normal carotid doppler  . AF (atrial fibrillation) ([place])   . Chronic knee pain 07/23/2015  . H/O echocardiogram 06-26-2012   Transthoracic echo    EF 55-60%  . H/O prior ablation treatment 2011  . History of atrial flutter   . History of cardiac monitoring 06-15-2012   sinus   . Hx of bee sting allergy 07/23/2015  . Normal cardiac stress test 05-24-2007   low risk scan    Past Surgical History:  Procedure Laterality Date  . left testicle removed (no cancer, benign mass)    . othroscopic knee surgery bil    . vericose veins stripped both legs      Family History  Problem Relation Age of Onset  . Alcohol abuse Mother   . Alcohol abuse Father   . COPD Father   . Arthritis Maternal Grandmother     Social History   Social History  . Marital status: Married    Spouse name: N/A  . Number of children: N/A  . Years of education: N/A   Occupational History  . Manager    Social History Main Topics  . Smoking status: Former Smoker    Packs/day: 1.50    Years: 8.00    Types: Cigarettes    Quit date: 07/11/1977  . Smokeless tobacco: Never Used  . Alcohol use 12.6 oz/week    21 Standard drinks or equivalent per week  . Drug use: No  .  Sexual activity: Not Asked   Other Topics Concern  . None   Social History Narrative   Work or School: [employer], [place] Situation: lives with wife      Spiritual Beliefs: none      Lifestyle: gym 3 days per week; diet is healthy           Current Outpatient Prescriptions:  .  aspirin 325 MG tablet, Take 325 mg by mouth daily.  , Disp: , Rfl:  .  benzonatate (TESSALON PERLES) 100 MG capsule, Take 1 capsule (100 mg total) by mouth 3 (three) times daily as needed for cough., Disp: 30 capsule, Rfl: 0 .  chlorpheniramine-HYDROcodone (TUSSIONEX PENNKINETIC ER) 10-8 MG/5ML SUER, Take 5 mLs by mouth at bedtime as needed for cough., Disp: 115 mL, Rfl: 0 .  citalopram (CELEXA) 10 MG tablet, Take 1 tablet (10 mg total) by mouth daily., Disp: 90 tablet, Rfl: 1 .  EPINEPHrine (EPIPEN) 0.3 mg/0.3 mL DEVI, Inject 0.3 mLs (0.3 mg total) into the muscle once., Disp: 1 Device, Rfl: 1 .  fexofenadine (ALLEGRA) 180 MG tablet, Take 180 mg by mouth daily.  , Disp: , Rfl:  .  fish oil-omega-3 fatty  acids 1000 MG capsule, Take 1 g by mouth daily., Disp: , Rfl:  .  fluocinonide cream (LIDEX) 0.05 %, Apply topically 2 (two) times daily., Disp: 60 g, Rfl: 1 .  Glucosamine-Chondroitin (GLUCOSAMINE CHONDR COMPLEX PO), Take 1 tablet by mouth 2 (two) times daily., Disp: , Rfl:  .  Multiple Vitamin (MULITIVITAMIN WITH MINERALS) TABS, Take 1 tablet by mouth daily., Disp: , Rfl:  .  predniSONE (DELTASONE) 20 MG tablet, Take 2 tablets (40 mg total) by mouth daily with breakfast., Disp: 8 tablet, Rfl: 0 .  doxycycline (VIBRAMYCIN) 100 MG capsule, Take 1 capsule (100 mg total) by mouth 2 (two) times daily., Disp: 20 capsule, Rfl: 0  EXAM:  Vitals:   05/05/16 1307  BP: 100/60  Pulse: 83  Temp: 99 F (37.2 C)    Body mass index is 27.49 kg/m.  GENERAL: vitals reviewed and listed above, alert, oriented, appears well hydrated and in no acute distress  HEENT: atraumatic, conjunttiva clear, no  obvious abnormalities on inspection of external nose and ears  NECK: no obvious masses on inspection  LUNGS: clear to auscultation bilaterally, no wheezes, rales or rhonchi, good air movement  SKIN: lima bean sized subcut nodule R breast near inferolat areola w/ some surrounding erythema of the skin, white material expressed; healing lac tip R middle digit with with erythematous dome shaped papule erupting from the lac wound base  CV: HRRR, no peripheral edema  MS: moves all extremities without noticeable abnormality  PSYCH: pleasant and cooperative, no obvious depression or anxiety  ASSESSMENT AND PLAN:  Discussed the following assessment and plan:  Cyst of right breast - Plan: WOUND CULTURE -we discussed possible serious and likely etiologies, workup and treatment, treatment risks and return precautions; likely inflamed or infected cyst, draining spontaneously -after this discussion, Jeffrey [name] opted for compresses, wound cx, doxy and close follow up; possible breast US or mammo -follow up advised 2 weeks -of course, we advised Jeffrey [name]  to return or notify a doctor immediately if symptoms worsen or persist or new concerns arise.  Open wound of right middle finger without damage to nail, initial encounter - Plan: Ambulatory referral to Hand Surgery with granuloma -he is concerned about the appearance of this -referral placed  -Patient advised to return or notify a doctor immediately if symptoms worsen or persist or new concerns arise.  Patient Instructions  BEFORE YOU LEAVE: -follow up: 2 weeks  Take the doxycycline as instructed. Stay out of the sun while on this antibiotic.  Warm compresses several times daily.  We placed a referral for you as discussed about the hand. It usually takes about 1-2 weeks to process and schedule this referral. If you have not heard from [place] regarding this appointment in 2 weeks please contact our office.     Colin Benton R., DO

## 2016-05-08 LAB — WOUND CULTURE
GRAM STAIN: NONE SEEN
Gram Stain: NONE SEEN
Gram Stain: NONE SEEN
ORGANISM ID, BACTERIA: NORMAL

## 2016-05-19 ENCOUNTER — Encounter: Payer: Self-pay | Admitting: Family Medicine

## 2016-05-19 ENCOUNTER — Ambulatory Visit (INDEPENDENT_AMBULATORY_CARE_PROVIDER_SITE_OTHER): Payer: BLUE CROSS/BLUE SHIELD | Admitting: Family Medicine

## 2016-05-19 VITALS — BP 100/62 | HR 91 | Temp 97.5°F | Ht 78.0 in | Wt 239.9 lb

## 2016-05-19 DIAGNOSIS — N63 Unspecified lump in unspecified breast: Secondary | ICD-10-CM | POA: Diagnosis not present

## 2016-05-19 NOTE — Progress Notes (Signed)
HPI:   Follow up R breast cyst. Treated with doxy/compresses. Culture neg. Reports he is not sure if better, wants me to examine. Finished antibiotic. Denies pain, redness, fevers, malaise. Thinks he might desire removal if cyst as was very annoyed by this - would want to see Dr. Zella Richer. Reports cancelled finger eval as resolving.  ROS: See pertinent positives and negatives per HPI.  Past Medical History:  Diagnosis Date  . Abnormal Doppler ultrasound of carotid artery 07-06-2012   normal carotid doppler  . AF (atrial fibrillation) ([place])   . Chronic knee pain 07/23/2015  . H/O echocardiogram 06-26-2012   Transthoracic echo    EF 55-60%  . H/O prior ablation treatment 2011  . History of atrial flutter   . History of cardiac monitoring 06-15-2012   sinus   . Hx of bee sting allergy 07/23/2015  . Normal cardiac stress test 05-24-2007   low risk scan    Past Surgical History:  Procedure Laterality Date  . left testicle removed (no cancer, benign mass)    . othroscopic knee surgery bil    . vericose veins stripped both legs      Family History  Problem Relation Age of Onset  . Alcohol abuse Mother   . Alcohol abuse Father   . COPD Father   . Arthritis Maternal Grandmother     Social History   Social History  . Marital status: Married    Spouse name: N/A  . Number of children: N/A  . Years of education: N/A   Occupational History  . Manager    Social History Main Topics  . Smoking status: Former Smoker    Packs/day: 1.50    Years: 8.00    Types: Cigarettes    Quit date: 07/11/1977  . Smokeless tobacco: Never Used  . Alcohol use 12.6 oz/week    21 Standard drinks or equivalent per week  . Drug use: No  . Sexual activity: Not Asked   Other Topics Concern  . None   Social History Narrative   Work or School: [employer], [place] Situation: lives with wife      Spiritual Beliefs: none      Lifestyle: gym 3 days per week; diet is healthy           Current Outpatient Prescriptions:  .  aspirin 325 MG tablet, Take 325 mg by mouth daily.  , Disp: , Rfl:  .  citalopram (CELEXA) 10 MG tablet, Take 1 tablet (10 mg total) by mouth daily., Disp: 90 tablet, Rfl: 1 .  EPINEPHrine (EPIPEN) 0.3 mg/0.3 mL DEVI, Inject 0.3 mLs (0.3 mg total) into the muscle once., Disp: 1 Device, Rfl: 1 .  fexofenadine (ALLEGRA) 180 MG tablet, Take 180 mg by mouth daily.  , Disp: , Rfl:  .  fish oil-omega-3 fatty acids 1000 MG capsule, Take 1 g by mouth daily., Disp: , Rfl:  .  fluocinonide cream (LIDEX) 0.05 %, Apply topically 2 (two) times daily., Disp: 60 g, Rfl: 1 .  Glucosamine-Chondroitin (GLUCOSAMINE CHONDR COMPLEX PO), Take 1 tablet by mouth 2 (two) times daily., Disp: , Rfl:  .  Multiple Vitamin (MULITIVITAMIN WITH MINERALS) TABS, Take 1 tablet by mouth daily., Disp: , Rfl:   EXAM:  Vitals:   05/19/16 1310  BP: 100/62  Pulse: 91  Temp: 97.5 F (36.4 C)    Body mass index is 27.72 kg/m.  GENERAL: vitals reviewed and listed above, alert, oriented, appears  well hydrated and in no acute distress  HEENT: atraumatic, conjunttiva clear, no obvious abnormalities on inspection of external nose and ears  NECK: no obvious masses on inspection  BREAST: pea sized rubbery nodule R breast in areola at 7:30  MS: moves all extremities without noticeable abnormality  PSYCH: pleasant and cooperative, no obvious depression or anxiety  ASSESSMENT AND PLAN:  Discussed the following assessment and plan:  Breast lump - Plan: US BREAST LTD UNI RIGHT INC AXILLA  -likely cyst, eval with breast center given remaining abnormality -he seems to maybe want to see a surgeon after that for removal, but will let us know -CPE 3 months -Patient advised to return or notify a doctor immediately if symptoms worsen or persist or new concerns arise.  Patient Instructions  BEFORE YOU LEAVE: -follow up: 3 months for CPE, fasting IF morning appointment  -We placed a  referral for you as discussed to the breast center for an ultrasound. It usually takes about 1-2 weeks to process and schedule this referral. If you have not heard from [place] regarding this appointment in 2 weeks please contact our office.    Colin Benton R., DO

## 2016-05-19 NOTE — Progress Notes (Signed)
Pre visit review using our clinic review tool, if applicable. No additional management support is needed unless otherwise documented below in the visit note. 

## 2016-05-19 NOTE — Patient Instructions (Addendum)
BEFORE YOU LEAVE: -follow up: 3 months for CPE, fasting IF morning appointment  -We placed a referral for you as discussed to the breast center for an ultrasound. It usually takes about 1-2 weeks to process and schedule this referral. If you have not heard from [place] regarding this appointment in 2 weeks please contact our office.

## 2016-06-09 ENCOUNTER — Other Ambulatory Visit: Payer: Self-pay | Admitting: Family Medicine

## 2016-06-09 DIAGNOSIS — N63 Unspecified lump in unspecified breast: Secondary | ICD-10-CM

## 2016-06-14 ENCOUNTER — Ambulatory Visit
Admission: RE | Admit: 2016-06-14 | Discharge: 2016-06-14 | Disposition: A | Discharge status: Home / Self Care | Payer: BLUE CROSS/BLUE SHIELD | Source: Ambulatory Visit | Attending: Family Medicine | Admitting: Family Medicine

## 2016-06-14 DIAGNOSIS — N63 Unspecified lump in unspecified breast: Secondary | ICD-10-CM

## 2016-07-03 ENCOUNTER — Other Ambulatory Visit: Payer: Self-pay | Admitting: Family Medicine

## 2016-08-10 NOTE — Progress Notes (Signed)
HPI:  Here for CPE:  -Concerns and/or follow up today:   Chronic Knee pain: -seeing Dr. Percell Miller   GAD: -has been on celexa for many years -denies any history of depression, panic, SI, manic symptoms -reports is doing great  Hx of A. Fibrillation: -s/p ablation -reports cardiologist told him to continue asa -no episodes since ablation -exercises regularly (gym 3 days per week) -no CP, SOB, DOE, palpitations, bleeding, hx bleeding  Hx bee sting allergy: -hx anaphylaxis -carries epipen  -Diet: variety of foods, balance and well rounded  -Exercise: exercising at gym 2-3 days per week  -Diabetes and Dyslipidemia Screening: FAsting for labs  -Vaccines: UTD  -sexual activity: yes, male partner, no new partners  -wants STI testing, Hep C screening (if born 14-1967): no  -FH colon or prstate ca: see FH Last colon cancer screening: reports normal at age 55 and told to repeat in 10 years Last prostate ca screening: PSA done last year, declined DRE in the past - wants to do PSA yearly as prior PCP did  -Alcohol, Tobacco, drug use: see social history - gave up beer completely  Review of Systems - no fevers, unintentional weight loss, vision loss, hearing loss, chest pain, sob, hemoptysis, melena, hematochezia, hematuria, genital discharge, changing or concerning skin lesions, bleeding, bruising, loc, thoughts of self harm or SI  Past Medical History:  Diagnosis Date  . Abnormal Doppler ultrasound of carotid artery 07-06-2012   normal carotid doppler  . AF (atrial fibrillation) ([place])   . Chronic knee pain 07/23/2015  . H/O echocardiogram 06-26-2012   Transthoracic echo    EF 55-60%  . H/O prior ablation treatment 2011  . History of atrial flutter   . History of cardiac monitoring 06-15-2012   sinus   . Hx of bee sting allergy 07/23/2015  . Normal cardiac stress test 05-24-2007   low risk scan    Past Surgical History:  Procedure Laterality Date  . left testicle  removed (no cancer, benign mass)    . othroscopic knee surgery bil    . vericose veins stripped both legs      Family History  Problem Relation Age of Onset  . Alcohol abuse Mother   . Alcohol abuse Father   . COPD Father   . Arthritis Maternal Grandmother   . Breast cancer Sister     Social History   Social History  . Marital status: Married    Spouse name: N/A  . Number of children: N/A  . Years of education: N/A   Occupational History  . Manager    Social History Main Topics  . Smoking status: Former Smoker    Packs/day: 1.50    Years: 8.00    Types: Cigarettes    Quit date: 07/11/1977  . Smokeless tobacco: Never Used  . Alcohol use 12.6 oz/week    21 Standard drinks or equivalent per week  . Drug use: No  . Sexual activity: Not Asked   Other Topics Concern  . None   Social History Narrative   Work or School: [employer], [place] Situation: lives with wife      Spiritual Beliefs: none      Lifestyle: gym 3 days per week; diet is healthy           Current Outpatient Prescriptions:  .  aspirin 325 MG tablet, Take 325 mg by mouth daily.  , Disp: , Rfl:  .  citalopram (CELEXA) 10 MG  tablet, TAKE 1 TABLET (10 MG TOTAL) BY MOUTH DAILY., Disp: 90 tablet, Rfl: 1 .  EPINEPHrine (EPIPEN) 0.3 mg/0.3 mL DEVI, Inject 0.3 mLs (0.3 mg total) into the muscle once., Disp: 1 Device, Rfl: 1 .  fexofenadine (ALLEGRA) 180 MG tablet, Take 180 mg by mouth daily.  , Disp: , Rfl:  .  fish oil-omega-3 fatty acids 1000 MG capsule, Take 1 g by mouth daily., Disp: , Rfl:  .  fluocinonide cream (LIDEX) 0.05 %, Apply topically 2 (two) times daily., Disp: 60 g, Rfl: 1 .  Glucosamine-Chondroitin (GLUCOSAMINE CHONDR COMPLEX PO), Take 1 tablet by mouth 2 (two) times daily., Disp: , Rfl:  .  Multiple Vitamin (MULITIVITAMIN WITH MINERALS) TABS, Take 1 tablet by mouth daily., Disp: , Rfl:   EXAM:  Vitals:   08/11/16 0825  BP: 102/64  Pulse: 74  Temp: 98.2 F (36.8 C)   TempSrc: Oral  Weight: 235 lb 3.2 oz (106.7 kg)  Height: 6' 7.5" (2.019 m)    Estimated body mass index is 26.16 kg/m as calculated from the following:   Height as of this encounter: 6' 7.5" (2.019 m).   Weight as of this encounter: 235 lb 3.2 oz (106.7 kg).  GENERAL: vitals reviewed and listed below, alert, oriented, appears well hydrated and in no acute distress  HEENT: head atraumatic, PERRLA, normal appearance of eyes, ears, nose and mouth. moist mucus membranes.  NECK: supple, no masses or lymphadenopathy  LUNGS: clear to auscultation bilaterally, no rales, rhonchi or wheeze  CV: HRRR, no peripheral edema or cyanosis, normal pedal pulses  ABDOMEN: bowel sounds normal, soft, non tender to palpation, no masses, no rebound or guarding  GU: declined  RECTAL: declined  SKIN: no rash or abnormal lesions, declined full skin exam  MS: normal gait, moves all extremities normally  NEURO: normal gait, speech and thought processing grossly intact, muscle tone grossly intact throughout  PSYCH: normal affect, pleasant and cooperative  ASSESSMENT AND PLAN:  Discussed the following assessment and plan:  Encounter for preventative adult health care examination - Plan: Hemoglobin A1c, PSA  Atrial fibrillation, unspecified type ([place]) - Plan: Basic metabolic panel, CBC  GAD (generalized anxiety disorder)  Mild hyperlipidemia - Plan: Lipid panel  -Discussed and advised all [place] preventive services health task force level A and B recommendations for age, sex and risks.  --Advised at least 150 minutes of exercise per week and a healthy diet with avoidance of (less then 1 serving per week) processed foods, white starches, red meat, fast foods and sweets and consisting of: * 5-9 servings of fresh fruits and vegetables (not corn or potatoes) *nuts and seeds, beans *olives and olive oil *lean meats such as fish and white chicken  *whole grains  -labs, studies and vaccines per orders  this encounter   Patient advised to return to clinic immediately if symptoms worsen or persist or new concerns.  Patient Instructions  BEFORE YOU LEAVE: -follow up: 6 months -labs  We have ordered labs or studies at this visit. It can take up to 1-2 weeks for results and processing. IF results require follow up or explanation, we will call you with instructions. Clinically stable results will be released to your [facility]. If you have not heard from [place] or cannot find your results in [facility] in 2 weeks please contact our office at [phone].  If you are not yet signed up for [facility], please consider signing up.  We recommend the following healthy lifestyle for LIFE: 1) Small  portions.   Tip: eat off of a salad plate instead of a dinner plate.  Tip: if you need more or a snack choose fruits, veggies and/or a handful of nuts or seeds.  2) Eat a healthy clean diet.  * Tip: Avoid (less then 1 serving per week): processed foods, sweets, sweetened drinks, white starches (rice, flour, bread, potatoes, pasta, etc), red meat, fast foods, butter  *Tip: CHOOSE instead   * 5-9 servings per day of fresh or frozen fruits and vegetables (but not corn, potatoes, bananas, canned or dried fruit)   *nuts and seeds, beans   *olives and olive oil   *small portions of lean meats such as fish and white chicken    *small portions of whole grains  3)Get at least 150 minutes of sweaty aerobic exercise per week.  4)Reduce stress - consider counseling, meditation and relaxation to balance other aspects of your life.  WE NOW OFFER   [facility] FAST TRACK!!!  SAME DAY Appointments for ACUTE CARE  Such as: Sprains, Injuries, cuts, abrasions, rashes, muscle pain, joint pain, back pain Colds, flu, sore throats, headache, allergies, cough, fever  Ear pain, sinus and eye infections Abdominal pain, nausea, vomiting, diarrhea, upset stomach Animal/insect bites  3 Easy Ways to Schedule: Walk-In  Scheduling Call in scheduling Mychart Sign-up: [email or web address]               No Follow-up on file.   Colin Benton R., DO

## 2016-08-11 ENCOUNTER — Ambulatory Visit (INDEPENDENT_AMBULATORY_CARE_PROVIDER_SITE_OTHER): Payer: BLUE CROSS/BLUE SHIELD | Admitting: Family Medicine

## 2016-08-11 ENCOUNTER — Encounter: Payer: Self-pay | Admitting: Family Medicine

## 2016-08-11 ENCOUNTER — Telehealth: Payer: Self-pay | Admitting: *Deleted

## 2016-08-11 VITALS — BP 102/64 | HR 74 | Temp 98.2°F | Ht 79.5 in | Wt 235.2 lb

## 2016-08-11 DIAGNOSIS — Z0000 Encounter for general adult medical examination without abnormal findings: Secondary | ICD-10-CM | POA: Diagnosis not present

## 2016-08-11 DIAGNOSIS — I4891 Unspecified atrial fibrillation: Secondary | ICD-10-CM

## 2016-08-11 DIAGNOSIS — F411 Generalized anxiety disorder: Secondary | ICD-10-CM | POA: Diagnosis not present

## 2016-08-11 DIAGNOSIS — E785 Hyperlipidemia, unspecified: Secondary | ICD-10-CM | POA: Diagnosis not present

## 2016-08-11 LAB — BASIC METABOLIC PANEL
BUN: 13 mg/dL (ref 6–23)
CALCIUM: 9.3 mg/dL (ref 8.4–10.5)
CO2: 30 mEq/L (ref 19–32)
CREATININE: 0.98 mg/dL (ref 0.40–1.50)
Chloride: 104 mEq/L (ref 96–112)
GFR: 82.08 mL/min (ref >60.00)
GLUCOSE: 89 mg/dL (ref 70–99)
POTASSIUM: 4.3 meq/L (ref 3.5–5.1)
Sodium: 138 mEq/L (ref 135–145)

## 2016-08-11 LAB — LIPID PANEL
Cholesterol: 171 mg/dL (ref 0–200)
HDL: 45.4 mg/dL (ref >39.00)
LDL Cholesterol: 106 mg/dL — ABNORMAL HIGH (ref 0–99)
NONHDL: 125.45
Total CHOL/HDL Ratio: 4
Triglycerides: 96 mg/dL (ref 0.0–149.0)
VLDL: 19.2 mg/dL (ref 0.0–40.0)

## 2016-08-11 LAB — CBC
HCT: 44.2 % (ref 39.0–52.0)
HEMOGLOBIN: 15.5 g/dL (ref 13.0–17.0)
MCHC: 35.1 g/dL (ref 30.0–36.0)
MCV: 88.3 fl (ref 78.0–100.0)
Platelets: 189 10*3/uL (ref 150.0–400.0)
RBC: 5.01 Mil/uL (ref 4.22–5.81)
RDW: 12.5 % (ref 11.5–15.5)
WBC: 4.2 10*3/uL (ref 4.0–10.5)

## 2016-08-11 LAB — PSA: PSA: 0.46 ng/mL (ref 0.10–4.00)

## 2016-08-11 LAB — HEMOGLOBIN A1C: Hgb A1c MFr Bld: 5.1 % (ref 4.6–6.5)

## 2016-08-11 MED ORDER — FLUOCINONIDE 0.05 % EX CREA: TOPICAL_CREAM | Freq: Two times a day (BID) | CUTANEOUS | 2 refills | Status: DC

## 2016-08-11 MED ORDER — CITALOPRAM HYDROBROMIDE 10 MG PO TABS: 10.0000 mg | ORAL_TABLET | Freq: Every day | ORAL | 3 refills | Status: DC

## 2016-08-11 MED ORDER — CITALOPRAM HYDROBROMIDE 10 MG PO TABS
10.0000 mg | ORAL_TABLET | Freq: Every day | ORAL | 3 refills | Status: DC
Start: 2016-08-11 — End: 2016-08-11

## 2016-08-11 MED ORDER — EPINEPHRINE 0.3 MG/0.3ML IJ SOAJ: 0.3000 mg | Freq: Once | INTRAMUSCULAR | 1 refills | Status: AC

## 2016-08-11 NOTE — Addendum Note (Signed)
Addended by: Agnes Lawrence on: 08/11/2016 09:35 AM   Modules accepted: Orders

## 2016-08-11 NOTE — Telephone Encounter (Signed)
I called [facility] a number of times to send in Celexa and the phone was busy.  Fax failed twice and I called the pt to ask if he had a different number to call.  He stated he and his wife have the same plan and to send this to [facility].  Rx sent via escribe to [facility].

## 2016-08-11 NOTE — Progress Notes (Signed)
Pre visit review using our clinic review tool, if applicable. No additional management support is needed unless otherwise documented below in the visit note. 

## 2016-08-11 NOTE — Addendum Note (Signed)
Addended by: Agnes Lawrence on: 08/11/2016 09:21 AM   Modules accepted: Orders

## 2016-08-11 NOTE — Addendum Note (Signed)
Addended by: Agnes Lawrence on: 08/11/2016 10:51 AM   Modules accepted: Orders

## 2016-08-11 NOTE — Progress Notes (Signed)
Printed Rx faxed to [facility] due to e-scribe error.

## 2016-08-11 NOTE — Patient Instructions (Signed)
BEFORE YOU LEAVE: -follow up: 6 months -labs  We have ordered labs or studies at this visit. It can take up to 1-2 weeks for results and processing. IF results require follow up or explanation, we will call you with instructions. Clinically stable results will be released to your [facility]. If you have not heard from [place] or cannot find your results in [facility] in 2 weeks please contact our office at [phone].  If you are not yet signed up for [facility], please consider signing up.  We recommend the following healthy lifestyle for LIFE: 1) Small portions.   Tip: eat off of a salad plate instead of a dinner plate.  Tip: if you need more or a snack choose fruits, veggies and/or a handful of nuts or seeds.  2) Eat a healthy clean diet.  * Tip: Avoid (less then 1 serving per week): processed foods, sweets, sweetened drinks, white starches (rice, flour, bread, potatoes, pasta, etc), red meat, fast foods, butter  *Tip: CHOOSE instead   * 5-9 servings per day of fresh or frozen fruits and vegetables (but not corn, potatoes, bananas, canned or dried fruit)   *nuts and seeds, beans   *olives and olive oil   *small portions of lean meats such as fish and white chicken    *small portions of whole grains  3)Get at least 150 minutes of sweaty aerobic exercise per week.  4)Reduce stress - consider counseling, meditation and relaxation to balance other aspects of your life.  WE NOW OFFER   [facility] FAST TRACK!!!  SAME DAY Appointments for ACUTE CARE  Such as: Sprains, Injuries, cuts, abrasions, rashes, muscle pain, joint pain, back pain Colds, flu, sore throats, headache, allergies, cough, fever  Ear pain, sinus and eye infections Abdominal pain, nausea, vomiting, diarrhea, upset stomach Animal/insect bites  3 Easy Ways to Schedule: Walk-In Scheduling Call in scheduling Mychart Sign-up: [email or web address]

## 2016-08-11 NOTE — Addendum Note (Signed)
Addended by: Agnes Lawrence on: 08/11/2016 10:22 AM   Modules accepted: Orders

## 2017-01-12 ENCOUNTER — Encounter: Payer: Self-pay | Admitting: Family Medicine

## 2017-01-16 ENCOUNTER — Ambulatory Visit: Payer: BLUE CROSS/BLUE SHIELD

## 2017-01-17 ENCOUNTER — Ambulatory Visit (INDEPENDENT_AMBULATORY_CARE_PROVIDER_SITE_OTHER): Payer: BLUE CROSS/BLUE SHIELD

## 2017-01-17 DIAGNOSIS — Z23 Encounter for immunization: Secondary | ICD-10-CM

## 2017-03-30 ENCOUNTER — Emergency Department (HOSPITAL_COMMUNITY): Payer: BLUE CROSS/BLUE SHIELD | Admitting: Registered Nurse

## 2017-03-30 ENCOUNTER — Encounter (HOSPITAL_COMMUNITY): Payer: Self-pay | Admitting: Emergency Medicine

## 2017-03-30 ENCOUNTER — Ambulatory Visit (HOSPITAL_COMMUNITY)
Admission: RE | Admit: 2017-03-30 | Discharge: 2017-03-30 | Disposition: A | Discharge status: Home / Self Care | Payer: BLUE CROSS/BLUE SHIELD | Source: Ambulatory Visit | Attending: Family Medicine | Admitting: Family Medicine

## 2017-03-30 ENCOUNTER — Encounter (HOSPITAL_COMMUNITY)
Admission: EM | Disposition: A | Discharge status: Home / Self Care | Payer: Self-pay | Source: Home / Self Care | Attending: Emergency Medicine

## 2017-03-30 ENCOUNTER — Observation Stay (HOSPITAL_COMMUNITY)
Admission: EM | Admit: 2017-03-30 | Discharge: 2017-03-31 | Disposition: A | Discharge status: Home / Self Care | Payer: BLUE CROSS/BLUE SHIELD | Attending: Surgery | Admitting: Surgery

## 2017-03-30 ENCOUNTER — Other Ambulatory Visit: Payer: Self-pay

## 2017-03-30 ENCOUNTER — Ambulatory Visit: Payer: Self-pay | Admitting: *Deleted

## 2017-03-30 ENCOUNTER — Encounter: Payer: Self-pay | Admitting: Family Medicine

## 2017-03-30 ENCOUNTER — Ambulatory Visit (INDEPENDENT_AMBULATORY_CARE_PROVIDER_SITE_OTHER): Payer: BLUE CROSS/BLUE SHIELD | Admitting: Family Medicine

## 2017-03-30 VITALS — BP 114/62 | HR 85 | Temp 98.6°F | Ht 79.5 in | Wt 241.4 lb

## 2017-03-30 DIAGNOSIS — K3533 Acute appendicitis with perforation and localized peritonitis, with abscess: Secondary | ICD-10-CM | POA: Diagnosis not present

## 2017-03-30 DIAGNOSIS — Z87891 Personal history of nicotine dependence: Secondary | ICD-10-CM | POA: Insufficient documentation

## 2017-03-30 DIAGNOSIS — Z7982 Long term (current) use of aspirin: Secondary | ICD-10-CM | POA: Insufficient documentation

## 2017-03-30 DIAGNOSIS — Z79899 Other long term (current) drug therapy: Secondary | ICD-10-CM | POA: Insufficient documentation

## 2017-03-30 DIAGNOSIS — R109 Unspecified abdominal pain: Secondary | ICD-10-CM | POA: Diagnosis present

## 2017-03-30 DIAGNOSIS — K3580 Unspecified acute appendicitis: Secondary | ICD-10-CM

## 2017-03-30 DIAGNOSIS — R1031 Right lower quadrant pain: Secondary | ICD-10-CM

## 2017-03-30 HISTORY — DX: Unspecified acute appendicitis: K35.80

## 2017-03-30 HISTORY — PX: LAPAROSCOPIC APPENDECTOMY: SHX408

## 2017-03-30 LAB — COMPREHENSIVE METABOLIC PANEL
ALT: 41 U/L (ref 0–53)
AST: 28 U/L (ref 0–37)
Albumin: 4.8 g/dL (ref 3.5–5.2)
Alkaline Phosphatase: 53 U/L (ref 39–117)
BILIRUBIN TOTAL: 1.1 mg/dL (ref 0.2–1.2)
BUN: 13 mg/dL (ref 6–23)
CALCIUM: 9.9 mg/dL (ref 8.4–10.5)
CHLORIDE: 97 meq/L (ref 96–112)
CO2: 30 meq/L (ref 19–32)
CREATININE: 1.09 mg/dL (ref 0.40–1.50)
GFR: 72.45 mL/min (ref >60.00)
Glucose, Bld: 119 mg/dL — ABNORMAL HIGH (ref 70–99)
Potassium: 4.6 mEq/L (ref 3.5–5.1)
SODIUM: 135 meq/L (ref 135–145)
Total Protein: 7 g/dL (ref 6.0–8.3)

## 2017-03-30 LAB — CBC WITH DIFFERENTIAL/PLATELET
BASOS ABS: 0 10*3/uL (ref 0.0–0.1)
Basophils Relative: 0.2 % (ref 0.0–3.0)
EOS ABS: 0.1 10*3/uL (ref 0.0–0.7)
Eosinophils Relative: 0.7 % (ref 0.0–5.0)
HEMATOCRIT: 46 % (ref 39.0–52.0)
HEMOGLOBIN: 15.7 g/dL (ref 13.0–17.0)
Lymphs Abs: 0.8 10*3/uL (ref 0.7–4.0)
MCHC: 34.1 g/dL (ref 30.0–36.0)
MCV: 91.3 fl (ref 78.0–100.0)
MONO ABS: 1.1 10*3/uL — AB (ref 0.1–1.0)
Monocytes Relative: 8.4 % (ref 3.0–12.0)
NEUTROS ABS: 11.4 10*3/uL — AB (ref 1.4–7.7)
Neutrophils Relative %: 84.6 % — ABNORMAL HIGH (ref 43.0–77.0)
PLATELETS: 166 10*3/uL (ref 150.0–400.0)
RBC: 5.03 Mil/uL (ref 4.22–5.81)
RDW: 12.6 % (ref 11.5–15.5)
WBC: 13.5 10*3/uL — ABNORMAL HIGH (ref 4.0–10.5)

## 2017-03-30 SURGERY — APPENDECTOMY, LAPAROSCOPIC
Anesthesia: General

## 2017-03-30 MED ORDER — CHLORHEXIDINE GLUCONATE CLOTH 2 % EX PADS: 6.0000 | MEDICATED_PAD | Freq: Once | CUTANEOUS | Status: DC

## 2017-03-30 MED ORDER — CITALOPRAM HYDROBROMIDE 20 MG PO TABS
10.0000 mg | ORAL_TABLET | Freq: Every day | ORAL | Status: DC
  Administered 2017-03-31: 10 mg via ORAL
  Filled 2017-03-30: qty 1

## 2017-03-30 MED ORDER — ONDANSETRON HCL 4 MG/2ML IJ SOLN
INTRAMUSCULAR | Status: DC | PRN
  Administered 2017-03-30: 4 mg via INTRAVENOUS

## 2017-03-30 MED ORDER — IOPAMIDOL (ISOVUE-300) INJECTION 61%
INTRAVENOUS | Status: AC
  Filled 2017-03-30: qty 100

## 2017-03-30 MED ORDER — DEXTROSE 5 % IV SOLN: 2.0000 g | INTRAVENOUS | Status: DC

## 2017-03-30 MED ORDER — ONDANSETRON HCL 4 MG/2ML IJ SOLN: 4.0000 mg | Freq: Four times a day (QID) | INTRAMUSCULAR | Status: DC | PRN

## 2017-03-30 MED ORDER — SUCCINYLCHOLINE CHLORIDE 200 MG/10ML IV SOSY
PREFILLED_SYRINGE | INTRAVENOUS | Status: AC
  Filled 2017-03-30: qty 10

## 2017-03-30 MED ORDER — PHENYLEPHRINE HCL 10 MG/ML IJ SOLN
INTRAMUSCULAR | Status: DC | PRN
  Administered 2017-03-30: 80 ug via INTRAVENOUS
  Administered 2017-03-30: 120 ug via INTRAVENOUS

## 2017-03-30 MED ORDER — HYDROMORPHONE HCL 1 MG/ML IJ SOLN
0.2500 mg | INTRAMUSCULAR | Status: DC | PRN
Start: 2017-03-30 — End: 2017-03-30

## 2017-03-30 MED ORDER — SODIUM CHLORIDE 0.9 % IR SOLN
Status: DC | PRN
  Administered 2017-03-30: 1000 mL

## 2017-03-30 MED ORDER — SUGAMMADEX SODIUM 500 MG/5ML IV SOLN
INTRAVENOUS | Status: AC
  Filled 2017-03-30: qty 5

## 2017-03-30 MED ORDER — PROPOFOL 10 MG/ML IV BOLUS
INTRAVENOUS | Status: AC
  Filled 2017-03-30: qty 20

## 2017-03-30 MED ORDER — PROPOFOL 10 MG/ML IV BOLUS
INTRAVENOUS | Status: DC | PRN
  Administered 2017-03-30: 200 mg via INTRAVENOUS

## 2017-03-30 MED ORDER — LABETALOL HCL 5 MG/ML IV SOLN
INTRAVENOUS | Status: DC | PRN
  Administered 2017-03-30: 5 mg via INTRAVENOUS

## 2017-03-30 MED ORDER — ACETAMINOPHEN 650 MG RE SUPP
650.0000 mg | Freq: Four times a day (QID) | RECTAL | Status: DC | PRN
  Filled 2017-03-30: qty 1

## 2017-03-30 MED ORDER — MIDAZOLAM HCL 2 MG/2ML IJ SOLN
INTRAMUSCULAR | Status: AC
  Filled 2017-03-30: qty 2

## 2017-03-30 MED ORDER — LIDOCAINE 2% (20 MG/ML) 5 ML SYRINGE
INTRAMUSCULAR | Status: AC
  Filled 2017-03-30: qty 5

## 2017-03-30 MED ORDER — METRONIDAZOLE IN NACL 5-0.79 MG/ML-% IV SOLN
500.0000 mg | INTRAVENOUS | Status: AC
  Filled 2017-03-30: qty 100

## 2017-03-30 MED ORDER — ONDANSETRON HCL 4 MG/2ML IJ SOLN
INTRAMUSCULAR | Status: AC
  Filled 2017-03-30: qty 2

## 2017-03-30 MED ORDER — BUPIVACAINE-EPINEPHRINE (PF) 0.25% -1:200000 IJ SOLN
INTRAMUSCULAR | Status: AC
  Filled 2017-03-30: qty 30

## 2017-03-30 MED ORDER — DEXAMETHASONE SODIUM PHOSPHATE 10 MG/ML IJ SOLN
INTRAMUSCULAR | Status: DC | PRN
  Administered 2017-03-30: 10 mg via INTRAVENOUS

## 2017-03-30 MED ORDER — LACTATED RINGERS IV SOLN
INTRAVENOUS | Status: DC | PRN
  Administered 2017-03-30 (×2): via INTRAVENOUS

## 2017-03-30 MED ORDER — ROCURONIUM BROMIDE 50 MG/5ML IV SOSY
PREFILLED_SYRINGE | INTRAVENOUS | Status: AC
  Filled 2017-03-30: qty 5

## 2017-03-30 MED ORDER — HYDROCODONE-ACETAMINOPHEN 5-325 MG PO TABS: 1.0000 | ORAL_TABLET | ORAL | Status: DC | PRN

## 2017-03-30 MED ORDER — ONDANSETRON 4 MG PO TBDP: 4.0000 mg | ORAL_TABLET | Freq: Four times a day (QID) | ORAL | Status: DC | PRN

## 2017-03-30 MED ORDER — ACETAMINOPHEN 325 MG PO TABS: 650.0000 mg | ORAL_TABLET | Freq: Four times a day (QID) | ORAL | Status: DC | PRN

## 2017-03-30 MED ORDER — TRAMADOL HCL 50 MG PO TABS: 50.0000 mg | ORAL_TABLET | Freq: Four times a day (QID) | ORAL | Status: DC | PRN

## 2017-03-30 MED ORDER — SODIUM CHLORIDE 0.9 % IV BOLUS (SEPSIS)
1000.0000 mL | Freq: Once | INTRAVENOUS | Status: AC
  Administered 2017-03-30: 1000 mL via INTRAVENOUS

## 2017-03-30 MED ORDER — MIDAZOLAM HCL 5 MG/5ML IJ SOLN
INTRAMUSCULAR | Status: DC | PRN
  Administered 2017-03-30: 2 mg via INTRAVENOUS

## 2017-03-30 MED ORDER — METRONIDAZOLE IN NACL 5-0.79 MG/ML-% IV SOLN
500.0000 mg | Freq: Three times a day (TID) | INTRAVENOUS | Status: DC
  Administered 2017-03-31 (×2): 500 mg via INTRAVENOUS
  Filled 2017-03-30 (×7): qty 100

## 2017-03-30 MED ORDER — KCL IN DEXTROSE-NACL 20-5-0.45 MEQ/L-%-% IV SOLN
INTRAVENOUS | Status: DC
  Administered 2017-03-31: via INTRAVENOUS
  Filled 2017-03-30: qty 1000

## 2017-03-30 MED ORDER — SUCCINYLCHOLINE CHLORIDE 20 MG/ML IJ SOLN
INTRAMUSCULAR | Status: DC | PRN
  Administered 2017-03-30: 160 mg via INTRAVENOUS

## 2017-03-30 MED ORDER — DEXAMETHASONE SODIUM PHOSPHATE 10 MG/ML IJ SOLN
INTRAMUSCULAR | Status: AC
  Filled 2017-03-30: qty 1

## 2017-03-30 MED ORDER — CIPROFLOXACIN IN D5W 400 MG/200ML IV SOLN
400.0000 mg | INTRAVENOUS | Status: DC
  Filled 2017-03-30: qty 200

## 2017-03-30 MED ORDER — IOPAMIDOL (ISOVUE-300) INJECTION 61%: 100.0000 mL | Freq: Once | INTRAVENOUS | Status: DC | PRN

## 2017-03-30 MED ORDER — LIDOCAINE HCL (CARDIAC) 20 MG/ML IV SOLN
INTRAVENOUS | Status: DC | PRN
  Administered 2017-03-30: 75 mg via INTRAVENOUS
  Administered 2017-03-30: 25 mg via INTRATRACHEAL

## 2017-03-30 MED ORDER — FENTANYL CITRATE (PF) 100 MCG/2ML IJ SOLN
INTRAMUSCULAR | Status: DC | PRN
  Administered 2017-03-30: 50 ug via INTRAVENOUS
  Administered 2017-03-30 (×2): 100 ug via INTRAVENOUS
  Administered 2017-03-30: 50 ug via INTRAVENOUS
  Administered 2017-03-30: 100 ug via INTRAVENOUS

## 2017-03-30 MED ORDER — BUPIVACAINE-EPINEPHRINE 0.25% -1:200000 IJ SOLN
INTRAMUSCULAR | Status: DC | PRN
  Administered 2017-03-30: 20 mL

## 2017-03-30 MED ORDER — MORPHINE SULFATE (PF) 4 MG/ML IV SOLN
4.0000 mg | Freq: Once | INTRAVENOUS | Status: AC
  Administered 2017-03-30: 4 mg via INTRAVENOUS
  Filled 2017-03-30: qty 1

## 2017-03-30 MED ORDER — PROMETHAZINE HCL 25 MG/ML IJ SOLN: 6.2500 mg | INTRAMUSCULAR | Status: DC | PRN

## 2017-03-30 MED ORDER — LACTATED RINGERS IR SOLN
Status: DC | PRN
  Administered 2017-03-30: 1000 mL

## 2017-03-30 MED ORDER — ONDANSETRON HCL 4 MG/2ML IJ SOLN
4.0000 mg | Freq: Once | INTRAMUSCULAR | Status: AC
  Administered 2017-03-30: 4 mg via INTRAVENOUS
  Filled 2017-03-30: qty 2

## 2017-03-30 MED ORDER — FENTANYL CITRATE (PF) 250 MCG/5ML IJ SOLN
INTRAMUSCULAR | Status: AC
  Filled 2017-03-30: qty 5

## 2017-03-30 MED ORDER — HYDROMORPHONE HCL 1 MG/ML IJ SOLN: 1.0000 mg | INTRAMUSCULAR | Status: DC | PRN

## 2017-03-30 MED ORDER — SUGAMMADEX SODIUM 200 MG/2ML IV SOLN
INTRAVENOUS | Status: DC | PRN
  Administered 2017-03-30: 300 mg via INTRAVENOUS

## 2017-03-30 MED ORDER — METRONIDAZOLE IN NACL 5-0.79 MG/ML-% IV SOLN
500.0000 mg | Freq: Once | INTRAVENOUS | Status: AC
  Administered 2017-03-30: 500 mg via INTRAVENOUS
  Filled 2017-03-30: qty 100

## 2017-03-30 MED ORDER — DEXTROSE 5 % IV SOLN
2.0000 g | Freq: Once | INTRAVENOUS | Status: AC
  Administered 2017-03-30: 2 g via INTRAVENOUS
  Filled 2017-03-30: qty 2

## 2017-03-30 MED ORDER — ROCURONIUM BROMIDE 100 MG/10ML IV SOLN
INTRAVENOUS | Status: DC | PRN
  Administered 2017-03-30: 50 mg via INTRAVENOUS

## 2017-03-30 SURGICAL SUPPLY — 28 items
APPLIER CLIP ROT 10 11.4 M/L (STAPLE)
APR CLP MED LRG 11.4X10 (STAPLE)
BAG SPEC RTRVL LRG 6X4 10 (ENDOMECHANICALS) ×1
CABLE HIGH FREQUENCY MONO STRZ (ELECTRODE) ×2 IMPLANT
CHLORAPREP W/TINT 26ML (MISCELLANEOUS) ×2 IMPLANT
CLIP APPLIE ROT 10 11.4 M/L (STAPLE) IMPLANT
COVER SURGICAL LIGHT HANDLE (MISCELLANEOUS) ×2 IMPLANT
CUTTER FLEX LINEAR 45M (STAPLE) ×2 IMPLANT
DRAPE LAPAROSCOPIC ABDOMINAL (DRAPES) ×2 IMPLANT
ELECT REM PT RETURN 15FT ADLT (MISCELLANEOUS) ×2 IMPLANT
ENDOLOOP SUT PDS II  0 18 (SUTURE)
ENDOLOOP SUT PDS II 0 18 (SUTURE) IMPLANT
GLOVE SURG ORTHO 8.0 STRL STRW (GLOVE) ×2 IMPLANT
GOWN STRL REUS W/TWL XL LVL3 (GOWN DISPOSABLE) ×2 IMPLANT
KIT BASIN OR (CUSTOM PROCEDURE TRAY) ×2 IMPLANT
POUCH SPECIMEN RETRIEVAL 10MM (ENDOMECHANICALS) ×2 IMPLANT
RELOAD 45 VASCULAR/THIN (ENDOMECHANICALS) IMPLANT
RELOAD STAPLE TA45 3.5 REG BLU (ENDOMECHANICALS) ×2 IMPLANT
SET IRRIG TUBING LAPAROSCOPIC (IRRIGATION / IRRIGATOR) ×2 IMPLANT
SHEARS HARMONIC ACE PLUS 36CM (ENDOMECHANICALS) ×2 IMPLANT
STRIP CLOSURE SKIN 1/2X4 (GAUZE/BANDAGES/DRESSINGS) IMPLANT
SUT MNCRL AB 4-0 PS2 18 (SUTURE) ×2 IMPLANT
TOWEL OR 17X26 10 PK STRL BLUE (TOWEL DISPOSABLE) ×2 IMPLANT
TOWEL OR NON WOVEN STRL DISP B (DISPOSABLE) ×2 IMPLANT
TRAY FOLEY W/METER SILVER 16FR (SET/KITS/TRAYS/PACK) ×2 IMPLANT
TRAY LAPAROSCOPIC (CUSTOM PROCEDURE TRAY) ×2 IMPLANT
TROCAR XCEL BLUNT TIP 100MML (ENDOMECHANICALS) ×2 IMPLANT
TROCAR XCEL NON-BLD 11X100MML (ENDOMECHANICALS) ×2 IMPLANT

## 2017-03-30 NOTE — Anesthesia Procedure Notes (Signed)
Procedure Name: Intubation Date/Time: 03/30/2017 9:07 PM Performed by: Lissa Morales, CRNA Pre-anesthesia Checklist: Patient identified, Emergency Drugs available, Suction available and Patient being monitored Patient Re-evaluated:Patient Re-evaluated prior to induction Oxygen Delivery Method: Circle system utilized Preoxygenation: Pre-oxygenation with 100% oxygen Induction Type: IV induction, Rapid sequence and Cricoid Pressure applied Laryngoscope Size: Mac and 4 Grade View: Grade II Tube type: Oral Number of attempts: 1 Airway Equipment and Method: Stylet and Oral airway Placement Confirmation: ETT inserted through vocal cords under direct vision,  positive ETCO2 and breath sounds checked- equal and bilateral Secured at: 22 cm Tube secured with: Tape Dental Injury: Teeth and Oropharynx as per pre-operative assessment  Difficulty Due To: Difficult Airway- due to anterior larynx

## 2017-03-30 NOTE — Telephone Encounter (Signed)
   Reason for Disposition . [1] MILD-MODERATE pain AND [2] constant AND [3] present > 2 hours  Answer Assessment - Initial Assessment Questions 1. LOCATION: "Where does it hurt?"      All over abdomen lower R 2-3 inches from rib cage 2. RADIATION: "Does the pain shoot anywhere else?" (e.g., chest, back)     no 3. ONSET: "When did the pain begin?" (Minutes, hours or days ago)      Yesterday afternoon- patient could not get comfortable 4. SUDDEN: "Gradual or sudden onset?"     sudden 5. PATTERN "Does the pain come and go, or is it constant?"    - If constant: "Is it getting better, staying the same, or worsening?"      (Note: Constant means the pain never goes away completely; most serious pain is constant and it progresses)     - If intermittent: "How long does it last?" "Do you have pain now?"     (Note: Intermittent means the pain goes away completely between bouts)     constant 6. SEVERITY: "How bad is the pain?"  (e.g., Scale 1-10; mild, moderate, or severe)    - MILD (1-3): doesn't interfere with normal activities, abdomen soft and not tender to touch     - MODERATE (4-7): interferes with normal activities or awakens from sleep, tender to touch     - SEVERE (8-10): excruciating pain, doubled over, unable to do any normal activities       7 7. RECURRENT SYMPTOM: "Have you ever had this type of abdominal pain before?" If so, ask: "When was the last time?" and "What happened that time?"      never 8. CAUSE: "What do you think is causing the abdominal pain?"     Took airborne tablet last night- otherwise unknown 9. RELIEVING/AGGRAVATING FACTORS: "What makes it better or worse?" (e.g., movement, antacids, bowel movement)     Advil 10. OTHER SYMPTOMS: "Has there been any vomiting, diarrhea, constipation, or urine problems?"       no  Protocols used: ABDOMINAL PAIN - MALE-A-AH

## 2017-03-30 NOTE — Op Note (Signed)
OPERATIVE REPORT - LAPAROSCOPIC APPENDECTOMY  Preop diagnosis:  Acute appendicitis  Postop diagnosis:  same  Procedure:  Laparoscopic appendectomy  Surgeon:  Armandina Gemma, MD  Anesthesia:  general endotracheal  Estimated blood loss:  minimal  Preparation:  Chlora-prep  Complications:  none  Indications:  [name] with 24 hr hx of abd pain localizing to the RLQ.  Pain began in the afternoon 12/5.  Took advil with some relief.  Persisted during the night and presented to primary MD this AM.  WBC elevated at 13.5.  CTA positive for acute appendicitis.  Referred to [facility] ER for management.  IV abx's started in ER.  General surgery called for admission and management.  Procedure:  Patient is brought to the operating room and placed in a supine position on the operating room table. Following administration of general anesthesia, a time out was held and the patient's name and procedure is confirmed. Patient is then prepped and draped in the usual strict aseptic fashion.  After ascertaining that an adequate level of anesthesia has been achieved, a peri-umbilical incision is made with a #15 blade. Dissection is carried down to the fascia. Fascia is incised in the midline and the peritoneal cavity is entered cautiously. A #0-vicryl pursestring suture is placed in the fascia. An Hassan cannula is introduced under direct vision and secured with the pursestring suture. The abdomen is insufflated with carbon dioxide. The laparoscope is introduced and the abdomen is explored. Operative ports are placed in the right upper quadrant and left lower quadrant. The appendix is identified. The mesoappendix is divided with the harmonic scalpel. Dissection is carried down to the base of the appendix. The base of the appendix is dissected out clearing the junction with the cecal wall. Using an Endo-GIA stapler, the base of the appendix is transected at the junction with the cecal wall. There is good  approximation of tissue along the staple line. There is good hemostasis along the staple line. The appendix is placed into an endo-catch bag and withdrawn through the umbilical port. The #0-vicryl pursestring suture is tied securely.  Right lower quadrant is irrigated with warm saline which is evacuated. Good hemostasis is noted. Ports are removed under direct vision. Good hemostasis is noted at the port sites. Pneumoperitoneum is released.  Skin incisions are anesthetized with local anesthetic. Wounds are closed with interrupted 4-0 Monocryl subcuticular sutures. Wounds are washed and dried and Steri-Strips are applied. Dressings are applied. The patient is awakened from anesthesia and brought to the recovery room. The patient tolerated the procedure well.  Armandina Gemma, MD [facility] Office: [phone]

## 2017-03-30 NOTE — ED Notes (Signed)
Awaiting security to lock up valuables.

## 2017-03-30 NOTE — H&P (Signed)
Jeffrey [name] is an 61 y.o. male.    General Surgery [facility]  Chief Complaint: acute appendicitis, abdominal pain  HPI: [name] with 24 hr hx of abd pain localizing to the RLQ.  Pain began in the afternoon 12/5.  Took advil with some relief.  Persisted during the night and presented to primary MD this AM.  WBC elevated at 13.5.  CTA positive for acute appendicitis.  Referred to [facility] ER for management.  IV abx's started in ER.  General surgery called for admission and management.  Prior hernia repair (inguinal, child).  Hx of atrial fib with ablation at [facility] 10 years ago.  On ASA.  Past Medical History:  Diagnosis Date  . Abnormal Doppler ultrasound of carotid artery 07-06-2012   normal carotid doppler  . AF (atrial fibrillation) ([place])   . Chronic knee pain 07/23/2015  . H/O echocardiogram 06-26-2012   Transthoracic echo    EF 55-60%  . H/O prior ablation treatment 2011  . History of atrial flutter   . History of cardiac monitoring 06-15-2012   sinus   . Hx of bee sting allergy 07/23/2015  . Normal cardiac stress test 05-24-2007   low risk scan    Past Surgical History:  Procedure Laterality Date  . ATRIAL FIBRILLATION ABLATION    . left testicle removed (no cancer, benign mass)    . othroscopic knee surgery bil    . vericose veins stripped both legs      Family History  Problem Relation Age of Onset  . Alcohol abuse Mother   . Alcohol abuse Father   . COPD Father   . Arthritis Maternal Grandmother   . Breast cancer Sister    Social History:  reports that he quit smoking about 39 years ago. His smoking use included cigarettes. He has a 12.00 pack-year smoking history. he has never used smokeless tobacco. He reports that he drinks about 12.6 oz of alcohol per week. He reports that he does not use drugs.  Allergies:  Allergies  Allergen Reactions  . Bee Venom Anaphylaxis     (Not in a hospital admission)  Results for orders placed  or performed in visit on 03/30/17 (from the past 48 hour(s))  CBC with Differential/Platelet     Status: Abnormal   Collection Time: 03/30/17  1:22 PM  Result Value Ref Range   WBC 13.5 (H) 4.0 - 10.5 K/uL   RBC 5.03 4.22 - 5.81 Mil/uL   Hemoglobin 15.7 13.0 - 17.0 g/dL   HCT 46.0 39.0 - 52.0 %   MCV 91.3 78.0 - 100.0 fl   MCHC 34.1 30.0 - 36.0 g/dL   RDW 12.6 11.5 - 15.5 %   Platelets 166.0 150.0 - 400.0 K/uL   Neutrophils Relative % 84.6 (H) 43.0 - 77.0 %   Lymphocytes Relative 6.1 Repeated and verified X2. (L) 12.0 - 46.0 %   Monocytes Relative 8.4 3.0 - 12.0 %   Eosinophils Relative 0.7 0.0 - 5.0 %   Basophils Relative 0.2 0.0 - 3.0 %   Neutro Abs 11.4 (H) 1.4 - 7.7 K/uL   Lymphs Abs 0.8 0.7 - 4.0 K/uL   Monocytes Absolute 1.1 (H) 0.1 - 1.0 K/uL   Eosinophils Absolute 0.1 0.0 - 0.7 K/uL   Basophils Absolute 0.0 0.0 - 0.1 K/uL  Comprehensive metabolic panel     Status: Abnormal   Collection Time: 03/30/17  1:22 PM  Result Value Ref Range  Sodium 135 135 - 145 mEq/L   Potassium 4.6 3.5 - 5.1 mEq/L   Chloride 97 96 - 112 mEq/L   CO2 30 19 - 32 mEq/L   Glucose, Bld 119 (H) 70 - 99 mg/dL   BUN 13 6 - 23 mg/dL   Creatinine, Ser 1.09 0.40 - 1.50 mg/dL   Total Bilirubin 1.1 0.2 - 1.2 mg/dL   Alkaline Phosphatase 53 39 - 117 U/L   AST 28 0 - 37 U/L   ALT 41 0 - 53 U/L   Total Protein 7.0 6.0 - 8.3 g/dL   Albumin 4.8 3.5 - 5.2 g/dL   Calcium 9.9 8.4 - 10.5 mg/dL   GFR 72.45 >60.00 mL/min   Ct Abdomen Pelvis W Contrast  Result Date: 03/30/2017 CLINICAL DATA:  Right lower quadrant abdominal pain. EXAM: CT ABDOMEN AND PELVIS WITH CONTRAST TECHNIQUE: Multidetector CT imaging of the abdomen and pelvis was performed using the standard protocol following bolus administration of intravenous contrast. CONTRAST:  100 cc of Isovue COMPARISON:  None. FINDINGS: Lower chest: Minimal subpleural nodularity a 2 mm in the anterior right lower lobe on image 11/series 6. Normal heart size without  pericardial or pleural effusion. Hepatobiliary: Normal liver. Normal gallbladder, without biliary ductal dilatation. Pancreas: Normal, without mass or ductal dilatation. Spleen: Normal in size, without focal abnormality. Adrenals/Urinary Tract: Normal adrenal glands. Normal kidneys, without hydronephrosis. Normal urinary bladder. Stomach/Bowel: Normal stomach, without wall thickening. Moderate amount of colonic stool. Normal terminal ileum, including on image 54/series 2. Mid small bowel loops measure up to 2.8 cm on image 44/series 2. Appendix: Location: Standard Diameter: Maximally 14 mm on image 59/series 2 and 16 mm on sagittal image 47. Appendicolith: Absent Mucosal hyper-enhancement: Possibly present at the appendiceal base, mild on image 61/ series 2. Extraluminal Gas: Absent Periappendiceal Collection: Moderate periappendiceal edema without well-defined fluid or gas collection. Vascular/Lymphatic: Normal caliber of the aorta and branch vessels. No abdominopelvic adenopathy. Reproductive: Normal prostate.  Left orchiectomy. Other: No significant free fluid. Musculoskeletal: No acute osseous abnormality. IMPRESSION: 1. Moderate non complicated acute appendicitis. 2. Borderline to mild mid small bowel dilatation, suggesting secondary adynamic ileus. 3. Left orchiectomy. 4. Minimal right lower lobe nodularity. No follow-up needed if patient is low-risk. Non-contrast chest CT can be considered in 12 months if patient is high-risk. This recommendation follows the consensus statement: Guidelines for Management of Incidental Pulmonary Nodules Detected on CT Images: From the Fleischner Society 2017; Radiology 2017; 284:228-243. These results will be called to the ordering clinician or representative by the Radiologist Assistant, and communication documented in the PACS or zVision Dashboard. Electronically Signed   By: Abigail Miyamoto M.D.   On: 03/30/2017 17:29    Review of Systems  Constitutional: Negative for  chills, diaphoresis, fever and weight loss.  HENT: Negative.   Eyes: Negative.   Respiratory: Negative.   Cardiovascular: Negative.   Gastrointestinal: Positive for abdominal pain and nausea. Negative for constipation, diarrhea and vomiting.  Genitourinary: Negative.   Musculoskeletal: Negative.   Skin: Negative.   Neurological: Negative.   Endo/Heme/Allergies: Negative.   Psychiatric/Behavioral: The patient is nervous/anxious.     Blood pressure 112/83, pulse 88, temperature 100.3 F (37.9 C), temperature source Oral, resp. rate 20, SpO2 98 %. Physical Exam  Constitutional: He is oriented to person, place, and time. He appears well-developed and well-nourished. No distress.  HENT:  Head: Normocephalic and atraumatic.  Right Ear: External ear normal.  Left Ear: External ear normal.  Mouth/Throat: No oropharyngeal exudate.  Eyes: Conjunctivae are normal. Pupils are equal, round, and reactive to light. No scleral icterus.  Neck: Normal range of motion. Neck supple. No tracheal deviation present. No thyromegaly present.  Cardiovascular: Normal rate, regular rhythm and normal heart sounds.  No murmur heard. Respiratory: Effort normal and breath sounds normal. No respiratory distress. He has no wheezes.  GI: Soft. He exhibits no distension and no mass. There is tenderness (RLQ). There is rebound. There is no guarding.  Musculoskeletal: Normal range of motion. He exhibits no edema, tenderness or deformity.  Neurological: He is alert and oriented to person, place, and time.  Skin: Skin is warm and dry. He is not diaphoretic.  Psychiatric: He has a normal mood and affect. His behavior is normal.     Assessment/Plan Acute appendicitis  IV abx's started in ER  Plan lap appendectomy this evening  NPO  The risks and benefits of the procedure have been discussed at length with the patient.  The patient understands the proposed procedure, potential alternative treatments, and the course of  recovery to be expected.  All of the patient's questions have been answered at this time.  The patient wishes to proceed with surgery.  Armandina Gemma, [facility] Office: [phone]    Earnstine Regal, MD 03/30/2017, 7:56 PM

## 2017-03-30 NOTE — Progress Notes (Signed)
Subjective:  Jeffrey [name] is a 61 y.o. year old very pleasant male patient who presents for/with See problem oriented charting ROS- no fever, chills, nausea, vomiting. Has significant abdominal pain   Past Medical History-  Patient Active Problem List   Diagnosis Date Noted  . Bronchitis 01/29/2016  . Chronic knee pain 07/23/2015  . Hx of bee sting allergy 07/23/2015  . Atrial fibrillation ([place]) 07/23/2015  . GAD (generalized anxiety disorder) 07/23/2015  . Rhinitis, allergic 07/23/2015    Medications- reviewed and updated Current Outpatient Medications  Medication Sig Dispense Refill  . aspirin 325 MG tablet Take 325 mg by mouth daily.      . citalopram (CELEXA) 10 MG tablet Take 1 tablet (10 mg total) by mouth daily. 90 tablet 3  . EPINEPHrine (EPIPEN) 0.3 mg/0.3 mL DEVI Inject 0.3 mLs (0.3 mg total) into the muscle once. 1 Device 1  . fexofenadine (ALLEGRA) 180 MG tablet Take 180 mg by mouth daily.      . fish oil-omega-3 fatty acids 1000 MG capsule Take 1 g by mouth daily.    . fluocinonide cream (LIDEX) 0.05 % Apply topically 2 (two) times daily. 60 g 2  . Glucosamine-Chondroitin (GLUCOSAMINE CHONDR COMPLEX PO) Take 1 tablet by mouth 2 (two) times daily.    . magnesium 30 MG tablet Take 30 mg by mouth daily.    . Multiple Vitamin (MULITIVITAMIN WITH MINERALS) TABS Take 1 tablet by mouth daily.     No current facility-administered medications for this visit.     Objective: BP 114/62 (BP Location: Left Arm, Patient Position: Sitting, Cuff Size: Large)   Pulse 85   Temp 98.6 F (37 C) (Oral)   Ht 6' 7.5" (2.019 m)   Wt 241 lb 6.4 oz (109.5 kg)   SpO2 98%   BMI 26.85 kg/m  Gen: NAD, resting comfortably CV: RRR no murmurs rubs or gallops Lungs: CTAB no crackles, wheeze, rhonchi Abdomen: soft/diffuse mild tenderness- outside of RLQ exhibits rebounding. In RLQ- pain primarily with palpation- patient appears in significant discomfort. No guarding/nondistended/normal bowel  sounds.  Ext: no edema Skin: warm, dry, no rash over abdomen  Assessment/Plan:  Right lower quadrant abdominal pain - Plan: CT Abdomen Pelvis W Contrast, CBC with Differential/Platelet, Comprehensive metabolic panel, CANCELED: Basic metabolic panel S:  4 pm yesterday- was reading slight sore throat and took airborne tablet. Went to [place] for lunch yesterday.   Could not sleep last night- with advil was able to get some sleep. Has been taking it every 4 hours. Was hurting all over abdomen but now also tender to touch in RLQ. Able to eat- no nausea or vomiting. No diarrhea or constipation. No blood in stool. Still has appendix and gallbladder. Pain is about 5-6/10 diffuse. RLQ pain up to 7-8/10 A/P: diffuse abdominal pain now concentrating in RLQ- High concern for appendicitis. Stat labs. Stat CT scan- hopeful for today- discussed would send to ER. Could be diverticulitis as well.  Patient Instructions  Stat labs  Come back to this room as want to give you time for Stat CT as well  Want this done today- very worried about appendicitis  If positive- would have you to go to the emergency room  Orders Placed This Encounter  Procedures  . CT Abdomen Pelvis W Contrast    Standing Status:   Future    Standing Expiration Date:   06/29/2018    Order Specific Question:   If indicated for the ordered procedure, I authorize  the administration of contrast media per Radiology protocol    Answer:   Yes    Order Specific Question:   Preferred imaging location?    Answer:   [place]    Order Specific Question:   Radiology Contrast Protocol - do NOT remove file path    Answer:   [email or web address]  . CBC with Differential/Platelet  . Comprehensive metabolic panel       Return precautions advised.  Garret Reddish, MD

## 2017-03-30 NOTE — ED Notes (Signed)
Valuables key given to [facility] in PACU. This RN watched her place it with his belongings.

## 2017-03-30 NOTE — ED Triage Notes (Signed)
Patient coming from [facility] with known appendicitis. Pain RLQ, but also all over abdomen. Denies N/V

## 2017-03-30 NOTE — Anesthesia Preprocedure Evaluation (Addendum)
Anesthesia Evaluation  Patient identified by MRN, date of birth, ID band Patient awake    Reviewed: Allergy & Precautions, NPO status , Patient's Chart, lab work & pertinent test results  History of Anesthesia Complications Negative for: history of anesthetic complications  Airway Mallampati: II  TM Distance: >3 FB Neck ROM: Full    Dental  (+) Caps, Dental Advisory Given   Pulmonary former smoker,    Pulmonary exam normal        Cardiovascular negative cardio ROS Normal cardiovascular exam  S/p ablation for AF   Neuro/Psych PSYCHIATRIC DISORDERS Anxiety negative neurological ROS     GI/Hepatic negative GI ROS, Neg liver ROS,   Endo/Other  negative endocrine ROS  Renal/GU negative Renal ROS  negative genitourinary   Musculoskeletal negative musculoskeletal ROS (+)   Abdominal   Peds negative pediatric ROS (+)  Hematology negative hematology ROS (+)   Anesthesia Other Findings   Reproductive/Obstetrics negative OB ROS                            Anesthesia Physical Anesthesia Plan  ASA: II and emergent  Anesthesia Plan: General   Post-op Pain Management:    Induction: Intravenous, Rapid sequence and Cricoid pressure planned  PONV Risk Score and Plan: 3 and Ondansetron, Dexamethasone and Diphenhydramine  Airway Management Planned: Oral ETT and Simple Face Mask  Additional Equipment:   Intra-op Plan:   Post-operative Plan: Extubation in OR  Informed Consent: I have reviewed the patients History and Physical, chart, labs and discussed the procedure including the risks, benefits and alternatives for the proposed anesthesia with the patient or authorized representative who has indicated his/her understanding and acceptance.   Dental advisory given  Plan Discussed with: CRNA, Anesthesiologist and Surgeon  Anesthesia Plan Comments:         Anesthesia Quick Evaluation

## 2017-03-30 NOTE — Transfer of Care (Addendum)
Immediate Anesthesia Transfer of Care Note  Patient: Jeffrey [name]  Procedure(s) Performed: APPENDECTOMY LAPAROSCOPIC (N/A )  Patient Location: PACU  Anesthesia Type:General  Level of Consciousness: awake, alert , oriented and patient cooperative  Airway & Oxygen Therapy: Patient Spontanous Breathing and Patient connected to face mask oxygen  Post-op Assessment: Report given to RN, Post -op Vital signs reviewed and stable and Patient moving all extremities X 4  Post vital signs: stable  Last Vitals:  Vitals:   03/30/17 1804 03/30/17 2217  BP: 112/83   Pulse: 88   Resp: 20 (P) 12  Temp: 37.9 C (P) 36.8 C  SpO2: 98%     Last Pain:  Vitals:   03/30/17 1925  TempSrc:   PainSc: 5          Complications: No apparent anesthesia complications

## 2017-03-30 NOTE — ED Provider Notes (Signed)
[facility] Provider Note   CSN: [id number] Arrival date & time: 03/30/17  1749     History   Chief Complaint Chief Complaint  Patient presents with  . Appendicitis    HPI [name] is a 61 y.o. male.  61 yo M with a chief complaint of diffuse abdominal pain.  This started last night.  The patient was unable to sleep.  He went this morning to his family physician.  Sent for outpatient CT and labs.  Since then the pain is gotten slightly worse in the right lower quadrant.  Worse with going over bumps in the car.  Worse with palpation.  Described as sharp.  Denies fevers.  Denies nausea or vomiting.  Has been able to eat and drink today.  Last had lunch at about 1130.  Has had a hernia repair about 55 years ago.  No other noted abdominal surgeries.   The history is provided by the patient.  Abdominal Pain   This is a new problem. The current episode started yesterday. The problem occurs constantly. The problem has been gradually worsening. The pain is associated with an unknown factor. The pain is located in the RLQ. The quality of the pain is sharp and shooting. The pain is at a severity of 8/10. The pain is moderate. Associated symptoms include nausea. Pertinent negatives include fever, diarrhea, vomiting, headaches, arthralgias and myalgias. The symptoms are aggravated by palpation (Bumps in the car). Nothing relieves the symptoms. Past workup includes CT scan.    Past Medical History:  Diagnosis Date  . Abnormal Doppler ultrasound of carotid artery 07-06-2012   normal carotid doppler  . AF (atrial fibrillation) ([place])   . Chronic knee pain 07/23/2015  . H/O echocardiogram 06-26-2012   Transthoracic echo    EF 55-60%  . H/O prior ablation treatment 2011  . History of atrial flutter   . History of cardiac monitoring 06-15-2012   sinus   . Hx of bee sting allergy 07/23/2015  . Normal cardiac stress test 05-24-2007   low risk scan     Patient Active Problem List   Diagnosis Date Noted  . Appendicitis, acute 03/30/2017  . Acute appendicitis 03/30/2017  . Bronchitis 01/29/2016  . Chronic knee pain 07/23/2015  . Hx of bee sting allergy 07/23/2015  . Atrial fibrillation ([place]) 07/23/2015  . GAD (generalized anxiety disorder) 07/23/2015  . Rhinitis, allergic 07/23/2015    Past Surgical History:  Procedure Laterality Date  . ATRIAL FIBRILLATION ABLATION    . left testicle removed (no cancer, benign mass)    . othroscopic knee surgery bil    . vericose veins stripped both legs         Home Medications    Prior to Admission medications   Medication Sig Start Date End Date Taking? Authorizing Provider  aspirin 325 MG tablet Take 325 mg by mouth daily.     Yes [provider]  citalopram (CELEXA) 10 MG tablet Take 1 tablet (10 mg total) by mouth daily. 08/11/16  Yes Lucretia Kern, DO  fexofenadine (ALLEGRA) 180 MG tablet Take 180 mg by mouth daily.     Yes [provider]  fish oil-omega-3 fatty acids 1000 MG capsule Take 1 g by mouth daily.   Yes [provider]  Glucosamine-Chondroitin (GLUCOSAMINE CHONDR COMPLEX PO) Take 1 tablet by mouth 2 (two) times daily.   Yes [provider]  magnesium 30 MG tablet Take 30 mg by mouth  daily.   Yes [provider]  Multiple Vitamin (MULITIVITAMIN WITH MINERALS) TABS Take 1 tablet by mouth daily.   Yes [provider]  EPINEPHrine (EPIPEN) 0.3 mg/0.3 mL DEVI Inject 0.3 mLs (0.3 mg total) into the muscle once. 07/19/12   Burchette, Alinda Sierras, MD  fluocinonide cream (LIDEX) 0.05 % Apply topically 2 (two) times daily. 08/11/16   Lucretia Kern, DO    Family History Family History  Problem Relation Age of Onset  . Alcohol abuse Mother   . Alcohol abuse Father   . COPD Father   . Arthritis Maternal Grandmother   . Breast cancer Sister     Social History Social History   Tobacco Use  . Smoking status: Former Smoker     Packs/day: 1.50    Years: 8.00    Pack years: 12.00    Types: Cigarettes    Last attempt to quit: 07/11/1977    Years since quitting: 39.7  . Smokeless tobacco: Never Used  Substance Use Topics  . Alcohol use: Yes    Alcohol/week: 12.6 oz    Types: 21 Standard drinks or equivalent per week  . Drug use: No     Allergies   Bee venom and Iodine solution [povidone iodine]   Review of Systems Review of Systems  Constitutional: Negative for chills and fever.  HENT: Negative for congestion and facial swelling.   Eyes: Negative for discharge and visual disturbance.  Respiratory: Negative for shortness of breath.   Cardiovascular: Negative for chest pain and palpitations.  Gastrointestinal: Positive for abdominal pain and nausea. Negative for diarrhea and vomiting.  Musculoskeletal: Negative for arthralgias and myalgias.  Skin: Negative for color change and rash.  Neurological: Negative for tremors, syncope and headaches.  Psychiatric/Behavioral: Negative for confusion and dysphoric mood.     Physical Exam Updated Vital Signs BP 135/77 (BP Location: Left Arm)   Pulse 86   Temp 98.4 F (36.9 C)   Resp 14   Ht 6\' 6"  (1.981 m)   Wt 108.9 kg (240 lb)   SpO2 96%   BMI 27.73 kg/m   Physical Exam  Constitutional: He is oriented to person, place, and time. He appears well-developed and well-nourished.  HENT:  Head: Normocephalic and atraumatic.  Eyes: EOM are normal. Pupils are equal, round, and reactive to light.  Neck: Normal range of motion. Neck supple. No JVD present.  Cardiovascular: Normal rate and regular rhythm. Exam reveals no gallop and no friction rub.  No murmur heard. Pulmonary/Chest: No respiratory distress. He has no wheezes.  Abdominal: He exhibits no distension and no mass. There is tenderness ( Diffusely tender about the abdomen worse in the right lower quadrant.). There is no rebound and no guarding.  Musculoskeletal: Normal range of motion.  Neurological:  He is alert and oriented to person, place, and time.  Skin: No rash noted. No pallor.  Psychiatric: He has a normal mood and affect. His behavior is normal.  Nursing note and vitals reviewed.    ED Treatments / Results  Labs (all labs ordered are listed, but only abnormal results are displayed) Labs Reviewed  SURGICAL PATHOLOGY    EKG  EKG Interpretation None       Radiology Ct Abdomen Pelvis W Contrast  Result Date: 03/30/2017 CLINICAL DATA:  Right lower quadrant abdominal pain. EXAM: CT ABDOMEN AND PELVIS WITH CONTRAST TECHNIQUE: Multidetector CT imaging of the abdomen and pelvis was performed using the standard protocol following bolus administration of intravenous contrast. CONTRAST:  100 cc of Isovue COMPARISON:  None. FINDINGS: Lower chest: Minimal subpleural nodularity a 2 mm in the anterior right lower lobe on image 11/series 6. Normal heart size without pericardial or pleural effusion. Hepatobiliary: Normal liver. Normal gallbladder, without biliary ductal dilatation. Pancreas: Normal, without mass or ductal dilatation. Spleen: Normal in size, without focal abnormality. Adrenals/Urinary Tract: Normal adrenal glands. Normal kidneys, without hydronephrosis. Normal urinary bladder. Stomach/Bowel: Normal stomach, without wall thickening. Moderate amount of colonic stool. Normal terminal ileum, including on image 54/series 2. Mid small bowel loops measure up to 2.8 cm on image 44/series 2. Appendix: Location: Standard Diameter: Maximally 14 mm on image 59/series 2 and 16 mm on sagittal image 47. Appendicolith: Absent Mucosal hyper-enhancement: Possibly present at the appendiceal base, mild on image 61/ series 2. Extraluminal Gas: Absent Periappendiceal Collection: Moderate periappendiceal edema without well-defined fluid or gas collection. Vascular/Lymphatic: Normal caliber of the aorta and branch vessels. No abdominopelvic adenopathy. Reproductive: Normal prostate.  Left orchiectomy.  Other: No significant free fluid. Musculoskeletal: No acute osseous abnormality. IMPRESSION: 1. Moderate non complicated acute appendicitis. 2. Borderline to mild mid small bowel dilatation, suggesting secondary adynamic ileus. 3. Left orchiectomy. 4. Minimal right lower lobe nodularity. No follow-up needed if patient is low-risk. Non-contrast chest CT can be considered in 12 months if patient is high-risk. This recommendation follows the consensus statement: Guidelines for Management of Incidental Pulmonary Nodules Detected on CT Images: From the Fleischner Society 2017; Radiology 2017; 284:228-243. These results will be called to the ordering clinician or representative by the Radiologist Assistant, and communication documented in the PACS or zVision Dashboard. Electronically Signed   By: Abigail Miyamoto M.D.   On: 03/30/2017 17:29    Procedures Procedures (including critical care time)  Medications Ordered in ED Medications  Chlorhexidine Gluconate Cloth 2 % PADS 6 each (not administered)    And  Chlorhexidine Gluconate Cloth 2 % PADS 6 each (not administered)  ciprofloxacin (CIPRO) IVPB 400 mg (not administered)  metroNIDAZOLE (FLAGYL) IVPB 500 mg (not administered)  citalopram (CELEXA) tablet 10 mg (not administered)  dextrose 5 % and 0.45 % NaCl with KCl 20 mEq/L infusion (not administered)  cefTRIAXone (ROCEPHIN) 2 g in dextrose 5 % 50 mL IVPB (not administered)    And  metroNIDAZOLE (FLAGYL) IVPB 500 mg (not administered)  acetaminophen (TYLENOL) tablet 650 mg (not administered)    Or  acetaminophen (TYLENOL) suppository 650 mg (not administered)  traMADol (ULTRAM) tablet 50 mg (not administered)  HYDROcodone-acetaminophen (NORCO/VICODIN) 5-325 MG per tablet 1-2 tablet (not administered)  HYDROmorphone (DILAUDID) injection 1 mg (not administered)  ondansetron (ZOFRAN-ODT) disintegrating tablet 4 mg (not administered)    Or  ondansetron (ZOFRAN) injection 4 mg (not administered)    cefTRIAXone (ROCEPHIN) 2 g in dextrose 5 % 50 mL IVPB (0 g Intravenous Stopped 03/30/17 1924)    And  metroNIDAZOLE (FLAGYL) IVPB 500 mg (0 mg Intravenous Stopped 03/30/17 2029)  morphine 4 MG/ML injection 4 mg (4 mg Intravenous Given 03/30/17 1848)  ondansetron (ZOFRAN) injection 4 mg (4 mg Intravenous Given 03/30/17 1848)  sodium chloride 0.9 % bolus 1,000 mL (1,000 mLs Intravenous Transfusing/Transfer 03/30/17 2029)     Initial Impression / Assessment and Plan / ED Course  I have reviewed the triage vital signs and the nursing notes.  Pertinent labs & imaging results that were available during my care of the patient were reviewed by me and considered in my medical decision making (see chart for details).     61 yo M  with a chief complaint of right lower quadrant abdominal pain.  Started last night.  Consult family doctor this morning.  Sent for outpatient CT scan.  Found to have uncomplicated appendicitis.  Called back and told to come to the ED.  Labs reviewed, patient with a leukocytosis.  Mild hyperglycemia.  Will make n.p.o.  Give a bolus of fluids pain meds and nausea meds antibiotics.  Discussed with surgery.  The patients results and plan were reviewed and discussed.   Any x-rays performed were independently reviewed by myself.   Differential diagnosis were considered with the presenting HPI.  Medications  Chlorhexidine Gluconate Cloth 2 % PADS 6 each (not administered)    And  Chlorhexidine Gluconate Cloth 2 % PADS 6 each (not administered)  ciprofloxacin (CIPRO) IVPB 400 mg (not administered)  metroNIDAZOLE (FLAGYL) IVPB 500 mg (not administered)  citalopram (CELEXA) tablet 10 mg (not administered)  dextrose 5 % and 0.45 % NaCl with KCl 20 mEq/L infusion (not administered)  cefTRIAXone (ROCEPHIN) 2 g in dextrose 5 % 50 mL IVPB (not administered)    And  metroNIDAZOLE (FLAGYL) IVPB 500 mg (not administered)  acetaminophen (TYLENOL) tablet 650 mg (not administered)    Or   acetaminophen (TYLENOL) suppository 650 mg (not administered)  traMADol (ULTRAM) tablet 50 mg (not administered)  HYDROcodone-acetaminophen (NORCO/VICODIN) 5-325 MG per tablet 1-2 tablet (not administered)  HYDROmorphone (DILAUDID) injection 1 mg (not administered)  ondansetron (ZOFRAN-ODT) disintegrating tablet 4 mg (not administered)    Or  ondansetron (ZOFRAN) injection 4 mg (not administered)  cefTRIAXone (ROCEPHIN) 2 g in dextrose 5 % 50 mL IVPB (0 g Intravenous Stopped 03/30/17 1924)    And  metroNIDAZOLE (FLAGYL) IVPB 500 mg (0 mg Intravenous Stopped 03/30/17 2029)  morphine 4 MG/ML injection 4 mg (4 mg Intravenous Given 03/30/17 1848)  ondansetron (ZOFRAN) injection 4 mg (4 mg Intravenous Given 03/30/17 1848)  sodium chloride 0.9 % bolus 1,000 mL (1,000 mLs Intravenous Transfusing/Transfer 03/30/17 2029)    Vitals:   03/30/17 2230 03/30/17 2245 03/30/17 2300 03/30/17 2316  BP: 121/69 129/77 127/73 135/77  Pulse: 85 88 84 86  Resp: 13 14 10 14   Temp:   99.4 F (37.4 C) 98.4 F (36.9 C)  TempSrc:      SpO2: 100% 96% 97% 96%  Weight:    108.9 kg (240 lb)  Height:    6\' 6"  (1.981 m)    Final diagnoses:  Appendicitis, acute    Admission/ observation were discussed with the admitting physician, patient and/or family and they are comfortable with the plan.    Final Clinical Impressions(s) / ED Diagnoses   Final diagnoses:  Appendicitis, acute    ED Discharge Orders    None       Deno Etienne, DO 03/30/17 2350

## 2017-03-30 NOTE — Patient Instructions (Signed)
Stat labs  Come back to this room as want to give you time for Stat CT as well  Want this done today- very worried about appendicitis  If positive- would have you to go to the emergency room

## 2017-03-30 NOTE — Progress Notes (Signed)
Thanks

## 2017-03-31 ENCOUNTER — Encounter (HOSPITAL_COMMUNITY): Payer: Self-pay | Admitting: Surgery

## 2017-03-31 MED ORDER — IBUPROFEN 400 MG PO TABS
600.0000 mg | ORAL_TABLET | Freq: Four times a day (QID) | ORAL | Status: DC | PRN
  Administered 2017-03-31: 600 mg via ORAL
  Filled 2017-03-31: qty 1

## 2017-03-31 MED ORDER — ACETAMINOPHEN 325 MG PO TABS: ORAL_TABLET | ORAL | Status: DC

## 2017-03-31 MED ORDER — HYDROCODONE-ACETAMINOPHEN 5-325 MG PO TABS: 1.0000 | ORAL_TABLET | ORAL | 0 refills | Status: DC | PRN

## 2017-03-31 MED ORDER — IBUPROFEN 200 MG PO TABS: ORAL_TABLET | ORAL | Status: DC

## 2017-03-31 NOTE — Discharge Summary (Signed)
Physician Discharge Summary  Patient ID: Jeffrey [name] MRN: [id number] DOB/AGE: [birth year] 61 y.o.  Admit date: 03/30/2017 Discharge date: 04/04/2017  Admission Diagnoses:  Acute appendicitis  Discharge Diagnoses:  Same  Principal Problem:   Appendicitis, acute Active Problems:   Acute appendicitis   PROCEDURES: laparoscopic appendectomy 03/30/17, Dr. Hyman [name] Course:  [name] with 24 hr hx of abd pain localizing to the RLQ.  Pain began in the afternoon 12/5.  Took advil with some relief.  Persisted during the night and presented to primary MD this AM.  WBC elevated at 13.5.  CTA positive for acute appendicitis.  Referred to [name] for management.  IV abx's started in ER.  General surgery called for admission and management. Prior hernia repair (inguinal, child). Hx of atrial fib with ablation at [name] 10 years ago.  On [name].  He was seen in the ED and taken to the OR by [name].  He did well and was eating breakfast without issue the following AM.  He felt much better.  We plan to mobilize him, try PO pain meds and send home later this AM, Follow up listed below.  Condition on dc:  Improved CBC Latest Ref Rng & Units 03/30/2017 08/11/2016 07/30/2014  WBC 4.0 - 10.5 K/uL 13.5(H) 4.2 4.4  Hemoglobin 13.0 - 17.0 g/dL 15.7 15.5 15.4  Hematocrit 39.0 - 52.0 % 46.0 44.2 44.1  Platelets 150.0 - 400.0 K/uL 166.0 189.0 188.0   CMP Latest Ref Rng & Units 03/30/2017 08/11/2016 07/30/2014  Glucose 70 - 99 mg/dL 119(H) 89 85  BUN 6 - 23 mg/dL 13 13 10   Creatinine 0.40 - 1.50 mg/dL 1.09 0.98 1.01  Sodium 135 - 145 mEq/L 135 138 136  Potassium 3.5 - 5.1 mEq/L 4.6 4.3 4.2  Chloride 96 - 112 mEq/L 97 104 102  CO2 19 - 32 mEq/L 30 30 30   Calcium 8.4 - 10.5 mg/dL 9.9 9.3 9.5  Total Protein 6.0 - 8.3 g/dL 7.0 - 6.8  Total Bilirubin 0.2 - 1.2 mg/dL 1.1 - 0.7  Alkaline Phos 39 - 117 U/L 53 - 56  AST 0 - 37 U/L 28 - 19  ALT 0 - 53 U/L 41 - 18   Disposition:  01-Home or Self Care   Allergies as of 03/31/2017      Reactions   Bee Venom Anaphylaxis   Iodine Solution [povidone Iodine] Itching   Patient states his arm was irritated following a previous arm surgery where he had been scrubbed with betadine.      Medication List    TAKE these medications   acetaminophen 325 MG tablet Commonly known as:  TYLENOL You can take two tablets every 6 hours as needed for pain.  You can alternate with ibuprofen or the prescribed pain medication.  DO NOT TAKE MORE THAN 4000 MG OF TYLENOL PER DAY.  IT CAN HARM YOUR LIVER.  TYLENOL (ACETAMINOPHEN) IS ALSO IN YOUR PRESCRIPTION PAIN MEDICATION.  YOU HAVE TO COUNT IT IN YOUR DAILY TOTAL.   aspirin 325 MG tablet Take 325 mg by mouth daily.   citalopram 10 MG tablet Commonly known as:  CELEXA Take 1 tablet (10 mg total) by mouth daily.   EPINEPHrine 0.3 mg/0.3 mL Devi Commonly known as:  EPIPEN Inject 0.3 mLs (0.3 mg total) into the muscle once.   fexofenadine 180 MG tablet Commonly known as:  ALLEGRA Take 180 mg by mouth daily.   fish  oil-omega-3 fatty acids 1000 MG capsule Take 1 g by mouth daily.   fluocinonide cream 0.05 % Commonly known as:  LIDEX Apply topically 2 (two) times daily.   GLUCOSAMINE CHONDR COMPLEX PO Take 1 tablet by mouth 2 (two) times daily.   HYDROcodone-acetaminophen 5-325 MG tablet Commonly known as:  NORCO/VICODIN Take 1-2 tablets by mouth every 4 (four) hours as needed for moderate pain.   ibuprofen 200 MG tablet Commonly known as:  ADVIL,MOTRIN You can take 2-3 tablets every 6 hours as needed for pain safely. Do not exceed this amount.  You can alternate with plain Tylenol(acetaminophen) or the prescribed pain medicine.  You can buy this over the counter at any drug store.   magnesium 30 MG tablet Take 30 mg by mouth daily.   multivitamin with minerals Tabs tablet Take 1 tablet by mouth daily.      Follow-up Information    [facility], [name] Follow  up.   Specialty:  General Surgery Why:  Our office should call your for follow up in our [name].  if you do not hear by Tuesday, call and ask.  Be at the office 30 minutes early for check in.  Bring photo ID and insurance information. Contact information: [name] [address] [place]  [phone]           Signed: Earnstine [name] 04/04/2017, 3:43 PM

## 2017-03-31 NOTE — Discharge Instructions (Signed)
Laparoscopic Appendectomy, Adult, Care After °These instructions give you information about caring for yourself after your procedure. Your doctor may also give you more specific instructions. Call your doctor if you have any problems or questions after your procedure. °Follow these instructions at home: °Medicines °· Take over-the-counter and prescription medicines only as told by your doctor. °· Do not drive for 24 hours if you received a sedative. °· Do not drive or use heavy machinery while taking prescription pain medicine. °· If you were prescribed an antibiotic medicine, take it as told by your doctor. Do not stop taking it even if you start to feel better. °Activity °· Do not lift anything that is heavier than 10 pounds (4.5 kg) for 3 weeks or as told by your doctor. °· Do not play contact sports for 3 weeks or as told by your doctor. °· Slowly return to your normal activities. °Bathing °· Keep your cuts from surgery (incisions) clean and dry. °? Gently wash the cuts with soap and water. °? Rinse the cuts with water until the soap is gone. °? Pat the cuts dry with a clean towel. Do not rub the cuts. °· You may take showers after 48 hours. °· Do not take baths, swim, or use a hot tub for 2 weeks or as told by your doctor. °Cut Care °· Follow instructions from your doctor about how to take care of your cuts. Make sure you: °? Wash your hands with soap and water before you change your bandage (dressing). If you do not have soap and water, use hand sanitizer. °? Change your bandage as told by your doctor. °? Leave stitches (sutures), skin glue, or skin tape (adhesive) strips in place. They may need to stay in place for 2 weeks or longer. If tape strips get loose and curl up, you may trim the loose edges. Do not remove tape strips completely unless your doctor says it is okay. °· Check your cuts every day for signs of infection. Check for: °? More redness, swelling, or pain. °? More fluid or  blood. °? Warmth. °? Pus or a bad smell. °Other Instructions °· If you were sent home with a drain, follow instructions from your doctor about how to use it and care for it. °· Take deep breaths. This helps to keep your lungs from getting swollen (inflamed). °· To help with constipation: °? Drink plenty of fluids. °? Eat plenty of fruits and vegetables. °· Keep all follow-up visits as told by your doctor. This is important. °Contact a doctor if: °· You have more redness, swelling, or pain around a cut from surgery. °· You have more fluid or blood coming from a cut. °· Your cut feels warm to the touch. °· You have pus or a bad smell coming from a cut or a bandage. °· The edges of a cut break open after the stitches have been taken out. °· You have pain in your shoulders that gets worse. °· You feel dizzy or you pass out (faint). °· You have shortness of breath. °· You keep feeling sick to your stomach (nauseous). °· You keep throwing up (vomiting). °· You get diarrhea or you cannot control your poop. °· You lose your appetite. °· You have swelling or pain in your legs. °Get help right away if: °· You have a fever. °· You get a rash. °· You have trouble breathing. °· You have sharp pains in your chest. °This information is not intended to replace advice given   to you by your health care provider. Make sure you discuss any questions you have with your health care provider. °Document Released: 02/05/2009 Document Revised: 09/17/2015 Document Reviewed: 09/29/2014 °Elsevier Interactive Patient Education © 2018 Elsevier Inc. ° °CCS ______[facility] °LAPAROSCOPIC SURGERY: POST OP INSTRUCTIONS °Always review your discharge instruction sheet given to you by the facility where your surgery was performed. °IF YOU HAVE DISABILITY OR FAMILY LEAVE FORMS, YOU MUST BRING THEM TO THE OFFICE FOR PROCESSING.   °DO NOT GIVE THEM TO YOUR DOCTOR. ° °1. A prescription for pain medication may be given to you upon  discharge.  Take your pain medication as prescribed, if needed.  If narcotic pain medicine is not needed, then you may take acetaminophen (Tylenol) or ibuprofen (Advil) as needed. °2. Take your usually prescribed medications unless otherwise directed. °3. If you need a refill on your pain medication, please contact your pharmacy.  They will contact our office to request authorization. Prescriptions will not be filled after 5pm or on week-ends. °4. You should follow a light diet the first few days after arrival home, such as soup and crackers, etc.  Be sure to include lots of fluids daily. °5. Most patients will experience some swelling and bruising in the area of the incisions.  Ice packs will help.  Swelling and bruising can take several days to resolve.  °6. It is common to experience some constipation if taking pain medication after surgery.  Increasing fluid intake and taking a stool softener (such as Colace) will usually help or prevent this problem from occurring.  A mild laxative (Milk of Magnesia or Miralax) should be taken according to package instructions if there are no bowel movements after 48 hours. °7. Unless discharge instructions indicate otherwise, you may remove your bandages 24-48 hours after surgery, and you may shower at that time.  You may have steri-strips (small skin tapes) in place directly over the incision.  These strips should be left on the skin for 7-10 days.  If your surgeon used skin glue on the incision, you may shower in 24 hours.  The glue will flake off over the next 2-3 weeks.  Any sutures or staples will be removed at the office during your follow-up visit. °8. ACTIVITIES:  You may resume regular (light) daily activities beginning the next day--such as daily self-care, walking, climbing stairs--gradually increasing activities as tolerated.  You may have sexual intercourse when it is comfortable.  Refrain from any heavy lifting or straining until approved by your doctor. °a. You  may drive when you are no longer taking prescription pain medication, you can comfortably wear a seatbelt, and you can safely maneuver your car and apply brakes. °b. RETURN TO WORK:  __________________________________________________________ °9. You should see your doctor in the office for a follow-up appointment approximately 2-3 weeks after your surgery.  Make sure that you call for this appointment within a day or two after you arrive home to insure a convenient appointment time. °10. OTHER INSTRUCTIONS: __________________________________________________________________________________________________________________________ __________________________________________________________________________________________________________________________ °WHEN TO CALL YOUR DOCTOR: °1. Fever over 101.0 °2. Inability to urinate °3. Continued bleeding from incision. °4. Increased pain, redness, or drainage from the incision. °5. Increasing abdominal pain ° °The clinic staff is available to answer your questions during regular business hours.  Please don’t hesitate to call and ask to speak to one of the nurses for clinical concerns.  If you have a medical emergency, go to the nearest emergency room or call 911.  A surgeon from   [facility] is always on call at the hospital. °[address], [address], [place], [place]  [address] ? [address] [address], [place], [place]   [address] °[phone] ? [phone] ? FAX [phone] °Web site: [email or web address] ° °

## 2017-03-31 NOTE — Telephone Encounter (Signed)
Patient was seen and treated. Appendectomy was done 03/30/17

## 2017-03-31 NOTE — Anesthesia Postprocedure Evaluation (Signed)
Anesthesia Post Note  Patient: Jeffrey [name]  Procedure(s) Performed: APPENDECTOMY LAPAROSCOPIC (N/A )     Patient location during evaluation: PACU Anesthesia Type: General Level of consciousness: sedated Pain management: pain level controlled Vital Signs Assessment: post-procedure vital signs reviewed and stable Respiratory status: spontaneous breathing and respiratory function stable Cardiovascular status: stable Postop Assessment: no apparent nausea or vomiting Anesthetic complications: no                 Chellsie Gomer DANIEL

## 2017-03-31 NOTE — Progress Notes (Signed)
1 Day Post-Op    KN:[id number] pain  Subjective: Doing well has already eaten breakfast, has not been up much and has not used PO pain medicine.  Port sites all look fine and he feels much better.  Objective: Vital signs in last 24 hours: Temp:  [98 F (36.7 C)-100.3 F (37.9 C)] 98 F (36.7 C) (12/07 0523) Pulse Rate:  [67-94] 67 (12/07 0523) Resp:  [10-20] 16 (12/07 0523) BP: (107-138)/(61-83) 107/64 (12/07 0523) SpO2:  [93 %-100 %] 93 % (12/07 0523) Weight:  [108.9 kg (240 lb)-109.5 kg (241 lb 6.4 oz)] 108.9 kg (240 lb) (12/06 2316)  200 Po 1800 IV 1075 urine Afebrile VSS No labs Intake/Output from previous day: 12/06 0701 - 12/07 0700 In: 2076.3 [P.O.:200; I.V.:1626.3; IV Piggyback:250] Out: 1100 [Urine:1075; Blood:25] Intake/Output this shift: No intake/output data recorded.  General appearance: alert, cooperative and moderate distress Resp: clear to auscultation bilaterally GI: sore, sites all look fine, tolerated breakfast well.  Lab Results:  Recent Labs    03/30/17 1322  WBC 13.5*  HGB 15.7  HCT 46.0  PLT 166.0    BMET Recent Labs    03/30/17 1322  NA 135  K 4.6  CL 97  CO2 30  GLUCOSE 119*  BUN 13  CREATININE 1.09  CALCIUM 9.9   PT/INR No results for input(s): LABPROT, INR in the last 72 hours.  Recent Labs  Lab 03/30/17 1322  AST 28  ALT 41  ALKPHOS 53  BILITOT 1.1  PROT 7.0  ALBUMIN 4.8     Lipase  No results found for: LIPASE   Prior to Admission medications   Medication Sig Start Date End Date Taking? Authorizing Provider  aspirin 325 MG tablet Take 325 mg by mouth daily.     Yes [provider]  citalopram (CELEXA) 10 MG tablet Take 1 tablet (10 mg total) by mouth daily. 08/11/16  Yes Lucretia Kern, DO  fexofenadine (ALLEGRA) 180 MG tablet Take 180 mg by mouth daily.     Yes [provider]  fish oil-omega-3 fatty acids 1000 MG capsule Take 1 g by mouth daily.   Yes [provider]   Glucosamine-Chondroitin (GLUCOSAMINE CHONDR COMPLEX PO) Take 1 tablet by mouth 2 (two) times daily.   Yes [provider]  magnesium 30 MG tablet Take 30 mg by mouth daily.   Yes [provider]  Multiple Vitamin (MULITIVITAMIN WITH MINERALS) TABS Take 1 tablet by mouth daily.   Yes [provider]  EPINEPHrine (EPIPEN) 0.3 mg/0.3 mL DEVI Inject 0.3 mLs (0.3 mg total) into the muscle once. 07/19/12   Burchette, Alinda Sierras, MD  fluocinonide cream (LIDEX) 0.05 % Apply topically 2 (two) times daily. 08/11/16   Lucretia Kern, DO    Medications: . Chlorhexidine Gluconate Cloth  6 each Topical Once   And  . Chlorhexidine Gluconate Cloth  6 each Topical Once  . citalopram  10 mg Oral Daily   . cefTRIAXone (ROCEPHIN)  IV     And  . metronidazole Stopped (03/31/17 0709)  . ciprofloxacin    . dextrose 5 % and 0.45 % NaCl with KCl 20 mEq/L 75 mL/hr at 03/31/17 0019   Anti-infectives (From admission, onward)   Start     Dose/Rate Route Frequency Ordered Stop   03/31/17 1800  cefTRIAXone (ROCEPHIN) 2 g in dextrose 5 % 50 mL IVPB     2 g 100 mL/hr over 30 Minutes Intravenous Every 24 hours 03/30/17 2253  03/31/17 0600  ciprofloxacin (CIPRO) IVPB 400 mg     400 mg 200 mL/hr over 60 Minutes Intravenous On call to O.R. 03/30/17 2329 04/01/17 0559   03/31/17 0600  metroNIDAZOLE (FLAGYL) IVPB 500 mg     500 mg 100 mL/hr over 60 Minutes Intravenous On call to O.R. 03/30/17 2329 03/31/17 0544   03/31/17 0000  metroNIDAZOLE (FLAGYL) IVPB 500 mg     500 mg 100 mL/hr over 60 Minutes Intravenous Every 8 hours 03/30/17 2253     03/30/17 1815  cefTRIAXone (ROCEPHIN) 2 g in dextrose 5 % 50 mL IVPB     2 g 100 mL/hr over 30 Minutes Intravenous  Once 03/30/17 1810 03/30/17 1924   03/30/17 1815  metroNIDAZOLE (FLAGYL) IVPB 500 mg     500 mg 100 mL/hr over 60 Minutes Intravenous  Once 03/30/17 1810 03/30/17 2029      Assessment/Plan Acute appendicitis Laparoscopic appendectomy,  03/30/17, Dr. Armandina Gemma Hx of AF with ablation FEN:  IV fluids/regular diet ID:  Flagyl/Rocephin DVT:  SCD Foley:  None Follow up:  [facility]   Plan:  Home later this AM, I told him to Try PO pain meds and be sure there is no issue.   LOS: 0 days    Vanesa Renier 03/31/2017 [phone]

## 2017-04-06 ENCOUNTER — Other Ambulatory Visit: Payer: Self-pay | Admitting: Family Medicine

## 2017-04-07 ENCOUNTER — Other Ambulatory Visit: Payer: Self-pay | Admitting: *Deleted

## 2017-04-07 DIAGNOSIS — R911 Solitary pulmonary nodule: Secondary | ICD-10-CM | POA: Insufficient documentation

## 2017-05-04 ENCOUNTER — Encounter: Payer: Self-pay | Admitting: Family Medicine

## 2017-07-10 DIAGNOSIS — M5431 Sciatica, right side: Secondary | ICD-10-CM | POA: Diagnosis not present

## 2017-07-10 DIAGNOSIS — M9905 Segmental and somatic dysfunction of pelvic region: Secondary | ICD-10-CM | POA: Diagnosis not present

## 2017-07-10 DIAGNOSIS — M9903 Segmental and somatic dysfunction of lumbar region: Secondary | ICD-10-CM | POA: Diagnosis not present

## 2017-07-12 DIAGNOSIS — M5431 Sciatica, right side: Secondary | ICD-10-CM | POA: Diagnosis not present

## 2017-07-12 DIAGNOSIS — M9903 Segmental and somatic dysfunction of lumbar region: Secondary | ICD-10-CM | POA: Diagnosis not present

## 2017-07-12 DIAGNOSIS — M9905 Segmental and somatic dysfunction of pelvic region: Secondary | ICD-10-CM | POA: Diagnosis not present

## 2017-07-17 DIAGNOSIS — M9905 Segmental and somatic dysfunction of pelvic region: Secondary | ICD-10-CM | POA: Diagnosis not present

## 2017-07-17 DIAGNOSIS — M9903 Segmental and somatic dysfunction of lumbar region: Secondary | ICD-10-CM | POA: Diagnosis not present

## 2017-07-17 DIAGNOSIS — M5431 Sciatica, right side: Secondary | ICD-10-CM | POA: Diagnosis not present

## 2017-07-24 DIAGNOSIS — L0390 Cellulitis, unspecified: Secondary | ICD-10-CM | POA: Diagnosis not present

## 2017-08-01 DIAGNOSIS — M9903 Segmental and somatic dysfunction of lumbar region: Secondary | ICD-10-CM | POA: Diagnosis not present

## 2017-08-01 DIAGNOSIS — M9905 Segmental and somatic dysfunction of pelvic region: Secondary | ICD-10-CM | POA: Diagnosis not present

## 2017-08-01 DIAGNOSIS — M5431 Sciatica, right side: Secondary | ICD-10-CM | POA: Diagnosis not present

## 2017-08-12 NOTE — Progress Notes (Signed)
HPI:  Using dictation device. Unfortunately this device frequently misinterprets words/phrases.  Here for CPE:  -Concerns and/or follow up today:  Chronic Knee pain: -seeing Dr. Percell Miller   GAD: -has been on celexa for many years - reports tried to stop and did not do well, wishes to continue -denies any history of depression, panic, SI, manic symptoms -reports is doing great  Hx of A. Fibrillation: -s/p ablation -reports cardiologist told him to continue asa -no episodes since ablation -exercises regularly -no CP, SOB, DOE, palpitations, bleeding, hx bleeding  Hx bee sting allergy: -hx anaphylaxis -carries epipen  -Diet: variety of foods, balance and well rounded, larger portion sizes -Exercise: regular exercise - working with a trainer -Diabetes and Dyslipidemia Screening: -Hx of HTN: no -Vaccines: UTD -sexual activity: yes, male partner, no new partners -wants STI testing, Hep C screening (if born 87-1967): no -FH colon or prstate ca: see FH Last colon cancer screening: reports done at age 82 and told norm  Last prostate ca screening: has done PSA yearly -Alcohol, Tobacco, drug use: see social history  Review of Systems - no fevers, unintentional weight loss, vision loss, hearing loss, chest pain, sob, hemoptysis, melena, hematochezia, hematuria, genital discharge, changing or concerning skin lesions, bleeding, bruising, loc, thoughts of self harm or SI  Past Medical History:  Diagnosis Date  . Abnormal Doppler ultrasound of carotid artery 07-06-2012   normal carotid doppler  . AF (atrial fibrillation) ([place])   . Chronic knee pain 07/23/2015  . H/O echocardiogram 06-26-2012   Transthoracic echo    EF 55-60%  . H/O prior ablation treatment 2011  . History of atrial flutter   . History of cardiac monitoring 06-15-2012   sinus   . Hx of bee sting allergy 07/23/2015  . Normal cardiac stress test 05-24-2007   low risk scan    Past Surgical History:  Procedure  Laterality Date  . ATRIAL FIBRILLATION ABLATION    . LAPAROSCOPIC APPENDECTOMY N/A 03/30/2017   Procedure: APPENDECTOMY LAPAROSCOPIC;  Surgeon: Armandina Gemma, MD;  Location: [facility];  Service: General;  Laterality: N/A;  . left testicle removed (no cancer, benign mass)    . othroscopic knee surgery bil    . vericose veins stripped both legs      Family History  Problem Relation Age of Onset  . Alcohol abuse Mother   . Alcohol abuse Father   . COPD Father   . Arthritis Maternal Grandmother   . Breast cancer Sister     Social History   Socioeconomic History  . Marital status: Married    Spouse name: Not on file  . Number of children: Not on file  . Years of education: Not on file  . Highest education level: Not on file  Occupational History  . Occupation: Freight forwarder  Social Needs  . Financial resource strain: Not on file  . Food insecurity:    Worry: Not on file    Inability: Not on file  . Transportation needs:    Medical: Not on file    Non-medical: Not on file  Tobacco Use  . Smoking status: Former Smoker    Packs/day: 1.50    Years: 8.00    Pack years: 12.00    Types: Cigarettes    Last attempt to quit: 07/11/1977    Years since quitting: 40.1  . Smokeless tobacco: Never Used  Substance and Sexual Activity  . Alcohol use: Yes    Alcohol/week: 12.6 oz    Types: 21 Standard drinks  or equivalent per week  . Drug use: No  . Sexual activity: Not on file  Lifestyle  . Physical activity:    Days per week: Not on file    Minutes per session: Not on file  . Stress: Not on file  Relationships  . Social connections:    Talks on phone: Not on file    Gets together: Not on file    Attends religious service: Not on file    Active member of club or organization: Not on file    Attends meetings of clubs or organizations: Not on file    Relationship status: Not on file  Other Topics Concern  . 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  aspirin 325 MG tablet, Take 325 mg by mouth daily.  , Disp: , Rfl:  .  citalopram (CELEXA) 10 MG tablet, Take 1 tablet (10 mg total) by mouth daily., Disp: 90 tablet, Rfl: 3 .  fexofenadine (ALLEGRA) 180 MG tablet, Take 180 mg by mouth daily.  , Disp: , Rfl:  .  fish oil-omega-3 fatty acids 1000 MG capsule, Take 1 g by mouth daily., Disp: , Rfl:  .  fluocinonide cream (LIDEX) 0.05 %, Apply topically 2 (two) times daily. (Patient taking differently: Apply topically as needed. ), Disp: 60 g, Rfl: 2 .  Glucosamine-Chondroitin (GLUCOSAMINE CHONDR COMPLEX PO), Take 1 tablet by mouth 2 (two) times daily., Disp: , Rfl:  .  ibuprofen (ADVIL,MOTRIN) 200 MG tablet, You can take 2-3 tablets every 6 hours as needed for pain safely. Do not exceed this amount.  You can alternate with plain Tylenol(acetaminophen) or the prescribed pain medicine.  You can buy this over the counter at any drug store., Disp: , Rfl:  .  magnesium 30 MG tablet, Take 30 mg by mouth daily., Disp: , Rfl:  .  Multiple Vitamin (MULITIVITAMIN WITH MINERALS) TABS, Take 1 tablet by mouth daily., Disp: , Rfl:  .  EPINEPHrine 0.3 mg/0.3 mL IJ SOAJ injection, Inject 0.3 mLs (0.3 mg total) into the muscle once for 1 dose., Disp: 1 Device, Rfl: 2  EXAM:  Vitals:   08/14/17 0849  BP: (!) 90/50  Pulse: 79  Temp: 98.1 F (36.7 C)  TempSrc: Oral  Weight: 239 lb 8 oz (108.6 kg)  Height: 6' 7" (2.007 m)    Estimated body mass index is 26.98 kg/m as calculated from the following:   Height as of this encounter: 6' 7" (2.007 m).   Weight as of this encounter: 239 lb 8 oz (108.6 kg).  GENERAL: vitals reviewed and listed below, alert, oriented, appears well hydrated and in no acute distress  HEENT: head atraumatic, PERRLA, normal appearance of eyes, ears, nose and mouth.  moist mucus membranes.  NECK: supple, no masses or lymphadenopathy  LUNGS: clear to auscultation bilaterally, no rales, rhonchi or wheeze  CV: HRRR, no peripheral edema or cyanosis, normal pedal pulses  ABDOMEN: bowel sounds normal, soft, non tender to palpation, no masses, no rebound or guarding  GU/DRE: declined  SKIN: no rash or abnormal lesions on exposed portions, declined full skin exam  MS: normal gait, moves all extremities normally  NEURO: normal gait, speech and  thought processing grossly intact, muscle tone grossly intact throughout  PSYCH: normal affect, pleasant and cooperative  ASSESSMENT AND PLAN:  Discussed the following assessment and plan:  PREVENTIVE EXAM: -Discussed and advised all [place] preventive services health task force level A and B recommendations for age, sex and risks. -Advised at least 150 minutes of exercise per week and a healthy diet with avoidance of (less then 1 serving per week) processed foods, white starches, red meat, fast foods and sweets and consisting of: * 5-9 servings of fresh fruits and vegetables (not corn or potatoes) *nuts and seeds, beans *olives and olive oil *lean meats such as fish and white chicken  *whole grains -labs, studies and vaccines per orders this encounter - Hemoglobin A1c - Lipid panel  2. Atrial fibrillation, unspecified type ([place]) - Basic metabolic panel - CBC  3. GAD (generalized anxiety disorder) -cont current tx  4. Prostate cancer screening - PSA   Patient Instructions  BEFORE YOU LEAVE: -labs -follow up: 6 months  We have ordered labs or studies at this visit. It can take up to 1-2 weeks for results and processing. IF results require follow up or explanation, we will call you with instructions. Clinically stable results will be released to your [facility]. If you have not heard from [place] or cannot find your results in [facility] in 2 weeks please contact our office at [phone].  If you are not yet  signed up for [facility], please consider signing up.   Preventive Care 40-64 Years, Male Preventive care refers to lifestyle choices and visits with your health care provider that can promote health and wellness. What does preventive care include?  A yearly physical exam. This is also called an annual well check.  Dental exams once or twice a year.  Routine eye exams. Ask your health care provider how often you should have your eyes checked.  Personal lifestyle choices, including: ? Daily care of your teeth and gums. ? Regular physical activity. ? Eating a healthy diet. ? Avoiding tobacco and drug use. ? Limiting alcohol use. ? Practicing safe sex. ? Taking low-dose aspirin every day starting at age 60. What happens during an annual well check? The services and screenings done by your health care provider during your annual well check will depend on your age, overall health, lifestyle risk factors, and family history of disease. Counseling Your health care provider may ask you questions about your:  Alcohol use.  Tobacco use.  Drug use.  Emotional well-being.  Home and relationship well-being.  Sexual activity.  Eating habits.  Work and work Statistician.  Screening You may have the following tests or measurements:  Height, weight, and BMI.  Blood pressure.  Lipid and cholesterol levels. These may be checked every 5 years, or more frequently if you are over 94 years old.  Skin check.  Lung cancer screening. You may have this screening every year starting at age 86 if you have a 30-pack-year history of smoking and currently smoke or have quit within the past 15 years.  Fecal occult blood test (FOBT) of the stool. You may have this test every year starting at age 69.  Flexible sigmoidoscopy or colonoscopy. You may have a sigmoidoscopy every 5 years or a colonoscopy every 10 years starting at age 52.  Prostate cancer screening. Recommendations will vary depending  on your family history and other risks.  Hepatitis C blood test.  Hepatitis B blood test.  Sexually transmitted disease (STD) testing.  Diabetes screening. This  is done by checking your blood sugar (glucose) after you have not eaten for a while (fasting). You may have this done every 1-3 years.  Discuss your test results, treatment options, and if necessary, the need for more tests with your health care provider. Vaccines Your health care provider may recommend certain vaccines, such as:  Influenza vaccine. This is recommended every year.  Tetanus, diphtheria, and acellular pertussis (Tdap, Td) vaccine. You may need a Td booster every 10 years.  Varicella vaccine. You may need this if you have not been vaccinated.  Zoster vaccine. You may need this after age 81.  Measles, mumps, and rubella (MMR) vaccine. You may need at least one dose of MMR if you were born in 1957 or later. You may also need a second dose.  Pneumococcal 13-valent conjugate (PCV13) vaccine. You may need this if you have certain conditions and have not been vaccinated.  Pneumococcal polysaccharide (PPSV23) vaccine. You may need one or two doses if you smoke cigarettes or if you have certain conditions.  Meningococcal vaccine. You may need this if you have certain conditions.  Hepatitis A vaccine. You may need this if you have certain conditions or if you travel or work in places where you may be exposed to hepatitis A.  Hepatitis B vaccine. You may need this if you have certain conditions or if you travel or work in places where you may be exposed to hepatitis B.  Haemophilus influenzae type b (Hib) vaccine. You may need this if you have certain risk factors.  Talk to your health care provider about which screenings and vaccines you need and how often you need them. This information is not intended to replace advice given to you by your health care provider. Make sure you discuss any questions you have with your  health care provider. Document Released: 05/08/2015 Document Revised: 12/30/2015 Document Reviewed: 02/10/2015 Elsevier Interactive Patient Education  2018 [employer].          No follow-ups on file.   Lucretia Kern, DO

## 2017-08-14 ENCOUNTER — Ambulatory Visit (INDEPENDENT_AMBULATORY_CARE_PROVIDER_SITE_OTHER): Payer: 59 | Admitting: Family Medicine

## 2017-08-14 ENCOUNTER — Encounter: Payer: Self-pay | Admitting: Family Medicine

## 2017-08-14 VITALS — BP 90/50 | HR 79 | Temp 98.1°F | Ht 79.0 in | Wt 239.5 lb

## 2017-08-14 DIAGNOSIS — Z125 Encounter for screening for malignant neoplasm of prostate: Secondary | ICD-10-CM

## 2017-08-14 DIAGNOSIS — F411 Generalized anxiety disorder: Secondary | ICD-10-CM

## 2017-08-14 DIAGNOSIS — I4891 Unspecified atrial fibrillation: Secondary | ICD-10-CM

## 2017-08-14 DIAGNOSIS — Z0000 Encounter for general adult medical examination without abnormal findings: Secondary | ICD-10-CM

## 2017-08-14 LAB — BASIC METABOLIC PANEL
BUN: 11 mg/dL (ref 6–23)
CO2: 27 meq/L (ref 19–32)
Calcium: 9.3 mg/dL (ref 8.4–10.5)
Chloride: 101 mEq/L (ref 96–112)
Creatinine, Ser: 0.91 mg/dL (ref 0.40–1.50)
GFR: 89.12 mL/min (ref >60.00)
GLUCOSE: 87 mg/dL (ref 70–99)
POTASSIUM: 4.4 meq/L (ref 3.5–5.1)
SODIUM: 139 meq/L (ref 135–145)

## 2017-08-14 LAB — LIPID PANEL
CHOL/HDL RATIO: 3
CHOLESTEROL: 166 mg/dL (ref 0–200)
HDL: 50.5 mg/dL (ref >39.00)
LDL CALC: 95 mg/dL (ref 0–99)
NonHDL: 115.84
TRIGLYCERIDES: 102 mg/dL (ref 0.0–149.0)
VLDL: 20.4 mg/dL (ref 0.0–40.0)

## 2017-08-14 LAB — CBC
HCT: 44 % (ref 39.0–52.0)
HEMOGLOBIN: 15.6 g/dL (ref 13.0–17.0)
MCHC: 35.5 g/dL (ref 30.0–36.0)
MCV: 88.4 fl (ref 78.0–100.0)
PLATELETS: 166 10*3/uL (ref 150.0–400.0)
RBC: 4.98 Mil/uL (ref 4.22–5.81)
RDW: 12.9 % (ref 11.5–15.5)
WBC: 4.1 10*3/uL (ref 4.0–10.5)

## 2017-08-14 LAB — PSA: PSA: 0.56 ng/mL (ref 0.10–4.00)

## 2017-08-14 LAB — HEMOGLOBIN A1C: Hgb A1c MFr Bld: 5.1 % (ref 4.6–6.5)

## 2017-08-14 MED ORDER — EPINEPHRINE 0.3 MG/0.3ML IJ SOAJ: 0.3000 mg | Freq: Once | INTRAMUSCULAR | 2 refills | Status: AC

## 2017-08-14 NOTE — Patient Instructions (Signed)
BEFORE YOU LEAVE: -labs -follow up: 6 months  We have ordered labs or studies at this visit. It can take up to 1-2 weeks for results and processing. IF results require follow up or explanation, we will call you with instructions. Clinically stable results will be released to your [facility]. If you have not heard from [place] or cannot find your results in [facility] in 2 weeks please contact our office at [phone].  If you are not yet signed up for [facility], please consider signing up.   Preventive Care 64-64 Years, Male Preventive care refers to lifestyle choices and visits with your health care provider that can promote health and wellness. What does preventive care include?  A yearly physical exam. This is also called an annual well check.  Dental exams once or twice a year.  Routine eye exams. Ask your health care provider how often you should have your eyes checked.  Personal lifestyle choices, including: ? Daily care of your teeth and gums. ? Regular physical activity. ? Eating a healthy diet. ? Avoiding tobacco and drug use. ? Limiting alcohol use. ? Practicing safe sex. ? Taking low-dose aspirin every day starting at age 52. What happens during an annual well check? The services and screenings done by your health care provider during your annual well check will depend on your age, overall health, lifestyle risk factors, and family history of disease. Counseling Your health care provider may ask you questions about your:  Alcohol use.  Tobacco use.  Drug use.  Emotional well-being.  Home and relationship well-being.  Sexual activity.  Eating habits.  Work and work Statistician.  Screening You may have the following tests or measurements:  Height, weight, and BMI.  Blood pressure.  Lipid and cholesterol levels. These may be checked every 5 years, or more frequently if you are over 64 years old.  Skin check.  Lung cancer screening. You may have this screening  every year starting at age 64 if you have a 30-pack-year history of smoking and currently smoke or have quit within the past 15 years.  Fecal occult blood test (FOBT) of the stool. You may have this test every year starting at age 64.  Flexible sigmoidoscopy or colonoscopy. You may have a sigmoidoscopy every 5 years or a colonoscopy every 10 years starting at age 64.  Prostate cancer screening. Recommendations will vary depending on your family history and other risks.  Hepatitis C blood test.  Hepatitis B blood test.  Sexually transmitted disease (STD) testing.  Diabetes screening. This is done by checking your blood sugar (glucose) after you have not eaten for a while (fasting). You may have this done every 1-3 years.  Discuss your test results, treatment options, and if necessary, the need for more tests with your health care provider. Vaccines Your health care provider may recommend certain vaccines, such as:  Influenza vaccine. This is recommended every year.  Tetanus, diphtheria, and acellular pertussis (Tdap, Td) vaccine. You may need a Td booster every 10 years.  Varicella vaccine. You may need this if you have not been vaccinated.  Zoster vaccine. You may need this after age 64.  Measles, mumps, and rubella (MMR) vaccine. You may need at least one dose of MMR if you were born in 1957 or later. You may also need a second dose.  Pneumococcal 13-valent conjugate (PCV13) vaccine. You may need this if you have certain conditions and have not been vaccinated.  Pneumococcal polysaccharide (PPSV23) vaccine. You may need one  or two doses if you smoke cigarettes or if you have certain conditions.  Meningococcal vaccine. You may need this if you have certain conditions.  Hepatitis A vaccine. You may need this if you have certain conditions or if you travel or work in places where you may be exposed to hepatitis A.  Hepatitis B vaccine. You may need this if you have certain  conditions or if you travel or work in places where you may be exposed to hepatitis B.  Haemophilus influenzae type b (Hib) vaccine. You may need this if you have certain risk factors.  Talk to your health care provider about which screenings and vaccines you need and how often you need them. This information is not intended to replace advice given to you by your health care provider. Make sure you discuss any questions you have with your health care provider. Document Released: 05/08/2015 Document Revised: 12/30/2015 Document Reviewed: 02/10/2015 Elsevier Interactive Patient Education  [employer].

## 2017-09-09 ENCOUNTER — Other Ambulatory Visit: Payer: Self-pay | Admitting: Family Medicine

## 2018-01-08 ENCOUNTER — Telehealth: Payer: Self-pay | Admitting: Family Medicine

## 2018-01-08 NOTE — Telephone Encounter (Signed)
Copied from [place]. Topic: Quick Communication - See Telephone Encounter >> Jan 08, 2018  4:30 PM Blase Mess A wrote: CRM for notification. See Telephone encounter for: 01/08/18. Patient is requesting Flu Shot

## 2018-01-09 NOTE — Telephone Encounter (Signed)
I left a detailed message for the pt to call back for an appt on the nurses schedule for any day there are openings for a flu shot.

## 2018-01-15 ENCOUNTER — Ambulatory Visit (INDEPENDENT_AMBULATORY_CARE_PROVIDER_SITE_OTHER): Payer: 59

## 2018-01-15 DIAGNOSIS — Z23 Encounter for immunization: Secondary | ICD-10-CM

## 2018-03-20 ENCOUNTER — Other Ambulatory Visit: Payer: Self-pay | Admitting: *Deleted

## 2018-03-20 MED ORDER — CITALOPRAM HYDROBROMIDE 10 MG PO TABS: 10.0000 mg | ORAL_TABLET | Freq: Every day | ORAL | 0 refills | Status: DC

## 2018-03-20 NOTE — Telephone Encounter (Signed)
Rx done. 

## 2018-05-14 ENCOUNTER — Other Ambulatory Visit: Payer: Self-pay | Admitting: Family Medicine

## 2018-08-08 ENCOUNTER — Other Ambulatory Visit: Payer: Self-pay | Admitting: Family Medicine

## 2018-11-01 ENCOUNTER — Other Ambulatory Visit: Payer: Self-pay | Admitting: Family Medicine

## 2018-11-21 ENCOUNTER — Other Ambulatory Visit: Payer: Self-pay | Admitting: Family Medicine

## 2019-01-08 ENCOUNTER — Other Ambulatory Visit: Payer: Self-pay | Admitting: Family Medicine

## 2019-02-04 ENCOUNTER — Telehealth: Payer: Self-pay | Admitting: Family Medicine

## 2019-02-04 MED ORDER — CITALOPRAM HYDROBROMIDE 10 MG PO TABS: 10.0000 mg | ORAL_TABLET | Freq: Every day | ORAL | 0 refills | Status: DC

## 2019-02-04 NOTE — Telephone Encounter (Signed)
Rx done. 

## 2019-02-04 NOTE — Telephone Encounter (Signed)
Patient requesting 90 day supply or enough pills of citalopram (CELEXA) 10 MG tablet  to hold him over until 03/20/2019 TOC with [place]ober[place], Steele Berg, MD, patient would like a follow up regarding the status.  [place], [place] [employer] [phone] (Phone) [phone] (Fax)

## 2019-03-18 DIAGNOSIS — H524 Presbyopia: Secondary | ICD-10-CM | POA: Diagnosis not present

## 2019-03-20 ENCOUNTER — Other Ambulatory Visit: Payer: Self-pay

## 2019-03-20 ENCOUNTER — Ambulatory Visit (INDEPENDENT_AMBULATORY_CARE_PROVIDER_SITE_OTHER): Payer: Medicare Other | Admitting: Family Medicine

## 2019-03-20 ENCOUNTER — Encounter: Payer: 59 | Admitting: Family Medicine

## 2019-03-20 ENCOUNTER — Encounter: Payer: Self-pay | Admitting: Family Medicine

## 2019-03-20 VITALS — BP 100/60 | HR 70 | Temp 97.0°F | Ht 79.0 in | Wt 228.4 lb

## 2019-03-20 DIAGNOSIS — Z125 Encounter for screening for malignant neoplasm of prostate: Secondary | ICD-10-CM

## 2019-03-20 DIAGNOSIS — Z91030 Bee allergy status: Secondary | ICD-10-CM

## 2019-03-20 DIAGNOSIS — J309 Allergic rhinitis, unspecified: Secondary | ICD-10-CM

## 2019-03-20 DIAGNOSIS — L988 Other specified disorders of the skin and subcutaneous tissue: Secondary | ICD-10-CM

## 2019-03-20 DIAGNOSIS — F411 Generalized anxiety disorder: Secondary | ICD-10-CM | POA: Diagnosis not present

## 2019-03-20 DIAGNOSIS — Z13220 Encounter for screening for lipoid disorders: Secondary | ICD-10-CM

## 2019-03-20 DIAGNOSIS — Z7289 Other problems related to lifestyle: Secondary | ICD-10-CM

## 2019-03-20 DIAGNOSIS — Z789 Other specified health status: Secondary | ICD-10-CM

## 2019-03-20 LAB — CBC WITH DIFFERENTIAL/PLATELET
Basophils Absolute: 0.1 10*3/uL (ref 0.0–0.1)
Basophils Relative: 1.4 % (ref 0.0–3.0)
Eosinophils Absolute: 0.3 10*3/uL (ref 0.0–0.7)
Eosinophils Relative: 6.2 % — ABNORMAL HIGH (ref 0.0–5.0)
HCT: 45.7 % (ref 39.0–52.0)
Hemoglobin: 15.6 g/dL (ref 13.0–17.0)
Lymphocytes Relative: 30.8 % (ref 12.0–46.0)
Lymphs Abs: 1.4 10*3/uL (ref 0.7–4.0)
MCHC: 34.2 g/dL (ref 30.0–36.0)
MCV: 89.4 fl (ref 78.0–100.0)
Monocytes Absolute: 0.3 10*3/uL (ref 0.1–1.0)
Monocytes Relative: 7.4 % (ref 3.0–12.0)
Neutro Abs: 2.4 10*3/uL (ref 1.4–7.7)
Neutrophils Relative %: 54.2 % (ref 43.0–77.0)
Platelets: 166 10*3/uL (ref 150.0–400.0)
RBC: 5.11 Mil/uL (ref 4.22–5.81)
RDW: 12.7 % (ref 11.5–15.5)
WBC: 4.4 10*3/uL (ref 4.0–10.5)

## 2019-03-20 LAB — COMPREHENSIVE METABOLIC PANEL
ALT: 21 U/L (ref 0–53)
AST: 21 U/L (ref 0–37)
Albumin: 4.3 g/dL (ref 3.5–5.2)
Alkaline Phosphatase: 61 U/L (ref 39–117)
BUN: 12 mg/dL (ref 6–23)
CO2: 32 mEq/L (ref 19–32)
Calcium: 9.6 mg/dL (ref 8.4–10.5)
Chloride: 100 mEq/L (ref 96–112)
Creatinine, Ser: 0.92 mg/dL (ref 0.40–1.50)
GFR: 82.38 mL/min (ref >60.00)
Glucose, Bld: 90 mg/dL (ref 70–99)
Potassium: 4.9 mEq/L (ref 3.5–5.1)
Sodium: 137 mEq/L (ref 135–145)
Total Bilirubin: 0.8 mg/dL (ref 0.2–1.2)
Total Protein: 6.8 g/dL (ref 6.0–8.3)

## 2019-03-20 LAB — LIPID PANEL
Cholesterol: 168 mg/dL (ref 0–200)
HDL: 57.8 mg/dL (ref >39.00)
LDL Cholesterol: 90 mg/dL (ref 0–99)
NonHDL: 110.37
Total CHOL/HDL Ratio: 3
Triglycerides: 102 mg/dL (ref 0.0–149.0)
VLDL: 20.4 mg/dL (ref 0.0–40.0)

## 2019-03-20 LAB — PSA: PSA: 0.85 ng/mL (ref 0.10–4.00)

## 2019-03-20 MED ORDER — CITALOPRAM HYDROBROMIDE 10 MG PO TABS: 10.0000 mg | ORAL_TABLET | Freq: Every day | ORAL | 3 refills | Status: DC

## 2019-03-20 MED ORDER — EPINEPHRINE 0.3 MG/0.3ML IJ SOAJ: 0.3000 mg | INTRAMUSCULAR | 2 refills | Status: AC | PRN

## 2019-03-20 MED ORDER — SHINGRIX 50 MCG/0.5ML IM SUSR: 0.5000 mL | Freq: Once | INTRAMUSCULAR | 0 refills | Status: AC

## 2019-03-20 NOTE — Progress Notes (Signed)
Jeffrey [name] DOB: March 04, 1956 Encounter date: 03/20/2019  This isa 63 y.o. male who presents to establish care. Chief Complaint  Patient presents with  . Establish Care   Last visit was with Dr. Maudie Mercury in April/2019 for preventive health exam  History of present illness: No specific concerns today.  Atrial fibrillation: Status post ablation.  Released from cardiology.   Knee pain: no longer having issue with knee pain. Had trainer at gym prior to [place] and this really helped with knees. Did core conditioning. Not sure about weight loss he has had. Used to do more traveling, more eating out. Eating patterns have changed. Light breakfast and light dinner and they tend to go out for lunch.   Anxiety: On Celexa; stable on the 10mg  dose.   History of bee sting allergy: he states he doesn't feel like he has true bee sting allergy. Thinks that reaction was related to slapping bee on arm - did get hives, difficulty breathing. Had lingering effects of swelling.   Seasonal allergies: takes allegra once daily which helps control symptoms. Used to have prolonged PND that would go into cough. This has really eliminated with the allegra.   Has skin tag on upper left leg he would like removed at some time.   No issues with varicosities on legs since vein stripping.   Has several beers in evening.   Last colonoscopy was in May/2015.  Told to repeat in 10 years.  Past Medical History:  Diagnosis Date  . Abnormal Doppler ultrasound of carotid artery 07-06-2012   normal carotid doppler  . Acute appendicitis 03/30/2017  . AF (atrial fibrillation) ([place])   . Appendicitis, acute 03/30/2017  . Chronic knee pain 07/23/2015  . H/O echocardiogram 06-26-2012   Transthoracic echo    EF 55-60%  . H/O prior ablation treatment 2011  . History of atrial flutter   . History of cardiac monitoring 06-15-2012   sinus   . Hx of bee sting allergy 07/23/2015  . Normal cardiac stress test 05-24-2007   low risk scan    Past Surgical History:  Procedure Laterality Date  . ATRIAL FIBRILLATION ABLATION    . LAPAROSCOPIC APPENDECTOMY N/A 03/30/2017   Procedure: APPENDECTOMY LAPAROSCOPIC;  Surgeon: Armandina Gemma, MD;  Location: [facility] ORS;  Service: General;  Laterality: N/A;  . left testicle removed (no cancer, benign mass)    . othroscopic knee surgery bil    . vericose veins stripped both legs     Allergies  Allergen Reactions  . Bee Venom Anaphylaxis  . Iodine Solution [Povidone Iodine] Itching    Patient states his arm was irritated following a previous arm surgery where he had been scrubbed with betadine.   Current Meds  Medication Sig  . aspirin 325 MG tablet Take 325 mg by mouth daily.    . citalopram (CELEXA) 10 MG tablet Take 1 tablet (10 mg total) by mouth daily.  . fexofenadine (ALLEGRA) 180 MG tablet Take 180 mg by mouth daily.    . fish oil-omega-3 fatty acids 1000 MG capsule Take 1 g by mouth daily.  . fluocinonide cream (LIDEX) 0.05 % Apply topically 2 (two) times daily. (Patient taking differently: Apply topically as needed. )  . Glucosamine-Chondroitin (GLUCOSAMINE CHONDR COMPLEX PO) Take 1 tablet by mouth 2 (two) times daily.  Marland Kitchen ibuprofen (ADVIL,MOTRIN) 200 MG tablet You can take 2-3 tablets every 6 hours as needed for pain safely. Do not exceed this amount.  You can alternate with plain Tylenol(acetaminophen) or the  prescribed pain medicine.  You can buy this over the counter at any drug store.  . magnesium 30 MG tablet Take 30 mg by mouth daily.  . Multiple Vitamin (MULITIVITAMIN WITH MINERALS) TABS Take 1 tablet by mouth daily.  . [DISCONTINUED] citalopram (CELEXA) 10 MG tablet Take 1 tablet (10 mg total) by mouth daily.   Social History   Tobacco Use  . Smoking status: Former Smoker    Packs/day: 1.50    Years: 8.00    Pack years: 12.00    Types: Cigarettes    Quit date: 07/11/1977    Years since quitting: 41.7  . Smokeless tobacco: Never Used  Substance Use Topics  . Alcohol  use: Yes    Alcohol/week: 21.0 standard drinks    Types: 21 Standard drinks or equivalent per week   Family History  Problem Relation Age of Onset  . Alcohol abuse Mother   . Healthy Mother   . Alcohol abuse Father   . COPD Father        IT trainer, smoker  . Arthritis Maternal Grandmother   . Cancer Maternal Grandmother        ?abdominal  . Breast cancer Sister   . Alzheimer's disease Maternal Grandfather   . Heart attack Paternal Grandfather      Review of Systems  Objective:  BP 100/60 (BP Location: Right Arm, Patient Position: Sitting, Cuff Size: Large)   Pulse 70   Temp (!) 97 F (36.1 C) (Temporal)   Ht 6\' 7"  (2.007 m)   Wt 228 lb 6.4 oz (103.6 kg)   SpO2 97%   BMI 25.73 kg/m   Weight: 228 lb 6.4 oz (103.6 kg)   BP Readings from Last 3 Encounters:  03/20/19 100/60  08/14/17 (!) 90/50  03/31/17 109/72   Wt Readings from Last 3 Encounters:  03/20/19 228 lb 6.4 oz (103.6 kg)  08/14/17 239 lb 8 oz (108.6 kg)  03/30/17 240 lb (108.9 kg)    Physical Exam Constitutional:      General: He is not in acute distress.    Appearance: He is well-developed.  HENT:     Head: Normocephalic and atraumatic.     Right Ear: External ear normal.     Left Ear: External ear normal.     Nose: Nose normal.     Mouth/Throat:     Pharynx: No oropharyngeal exudate.  Eyes:     Conjunctiva/sclera: Conjunctivae normal.     Pupils: Pupils are equal, round, and reactive to light.  Neck:     Musculoskeletal: Neck supple.     Thyroid: No thyromegaly.  Cardiovascular:     Rate and Rhythm: Normal rate and regular rhythm.     Heart sounds: Normal heart sounds. No murmur. No friction rub. No gallop.   Pulmonary:     Effort: Pulmonary effort is normal. No respiratory distress.     Breath sounds: Normal breath sounds. No stridor. No wheezing or rales.  Abdominal:     General: Bowel sounds are normal.     Palpations: Abdomen is soft.  Musculoskeletal: Normal range of motion.   Skin:    General: Skin is warm and dry.     Comments: Large polyp left posterior thigh flesh colored 1cm.  Neurological:     Mental Status: He is alert and oriented to person, place, and time.  Psychiatric:        Behavior: Behavior normal.        Thought Content: Thought content normal.  Judgment: Judgment normal.     Assessment/Plan:  1. Hx of bee sting allergy Patient has not been keeping an EpiPen with current insurance to keep an EpiPen on hand. - EPINEPHrine 0.3 mg/0.3 mL IJ SOAJ injection; Inject 0.3 mLs (0.3 mg total) into the muscle as needed for anaphylaxis.  Dispense: 1 each; Refill: 2  2. Allergic rhinitis, unspecified seasonality, unspecified trigger Stable with Allegra.  3. GAD (generalized anxiety disorder) Stable on current Celexa 10 mg.  4. Prostate cancer screening After discussing pros and cons of screening, patient wishes to have blood test. - PSA; Future - PSA  5. Lipid screening - Lipid panel; Future - Lipid panel  6. Alcohol use - CBC with Differential/Platelet; Future - Comprehensive metabolic panel; Future - Comprehensive metabolic panel - CBC with Differential/Platelet  7. Polyp Will return for removal of polyp  Return for follow up for skin tag removal 30 minutes.  Micheline Rough, MD

## 2019-03-28 ENCOUNTER — Other Ambulatory Visit: Payer: Self-pay

## 2019-03-29 ENCOUNTER — Other Ambulatory Visit: Payer: Self-pay | Admitting: Family Medicine

## 2019-03-29 ENCOUNTER — Ambulatory Visit (INDEPENDENT_AMBULATORY_CARE_PROVIDER_SITE_OTHER): Payer: Medicare Other

## 2019-03-29 ENCOUNTER — Encounter: Payer: Self-pay | Admitting: Family Medicine

## 2019-03-29 ENCOUNTER — Ambulatory Visit (INDEPENDENT_AMBULATORY_CARE_PROVIDER_SITE_OTHER): Payer: Medicare Other | Admitting: Family Medicine

## 2019-03-29 VITALS — BP 122/78 | HR 73 | Temp 97.2°F | Wt 229.9 lb

## 2019-03-29 DIAGNOSIS — L918 Other hypertrophic disorders of the skin: Secondary | ICD-10-CM | POA: Diagnosis not present

## 2019-03-29 DIAGNOSIS — M79672 Pain in left foot: Secondary | ICD-10-CM

## 2019-03-29 DIAGNOSIS — D489 Neoplasm of uncertain behavior, unspecified: Secondary | ICD-10-CM

## 2019-03-29 DIAGNOSIS — M19072 Primary osteoarthritis, left ankle and foot: Secondary | ICD-10-CM | POA: Diagnosis not present

## 2019-03-29 DIAGNOSIS — D225 Melanocytic nevi of trunk: Secondary | ICD-10-CM | POA: Diagnosis not present

## 2019-03-29 MED ORDER — LIDOCAINE-EPINEPHRINE (PF) 1 %-1:200000 IJ SOLN
3.0000 mL | Freq: Once | INTRAMUSCULAR | Status: AC
  Administered 2019-03-29: 3 mL via INTRADERMAL

## 2019-03-29 MED ORDER — FLUOCINONIDE 0.05 % EX CREA: TOPICAL_CREAM | Freq: Two times a day (BID) | CUTANEOUS | 2 refills | Status: DC

## 2019-03-29 NOTE — Patient Instructions (Signed)
Keep wounds clean; apply antibiotic ointment on them daily until healed. Let me know if any redness, drainage. I'll call you with biopsy results.

## 2019-03-29 NOTE — Progress Notes (Addendum)
Jeffrey [name] DOB: 02/14/1956 Encounter date: 03/29/2019  This is a 63 y.o. male who presents with Chief Complaint  Patient presents with  . Procedure    skin tag removal    History of present illness: Has had lage skin tag on back of leg; irritating/rubs. Also two larger tags on back that bother him. Not sure they are still enlarging. Have been there for years.   Left foot pain; started recently. Hurts more with standing; worse with prolonged standing/walking. Notes at hospital. Hasn't had foot pain before. Arch of left foot.   Allergies  Allergen Reactions  . Bee Venom Anaphylaxis  . Iodine Solution [Povidone Iodine] Itching    Patient states his arm was irritated following a previous arm surgery where he had been scrubbed with betadine.   Current Meds  Medication Sig  . aspirin 325 MG tablet Take 325 mg by mouth daily.    . citalopram (CELEXA) 10 MG tablet Take 1 tablet (10 mg total) by mouth daily.  Marland Kitchen EPINEPHrine 0.3 mg/0.3 mL IJ SOAJ injection Inject 0.3 mLs (0.3 mg total) into the muscle as needed for anaphylaxis.  . fexofenadine (ALLEGRA) 180 MG tablet Take 180 mg by mouth daily.    . fish oil-omega-3 fatty acids 1000 MG capsule Take 1 g by mouth daily.  . fluocinonide cream (LIDEX) 0.05 % Apply topically 2 (two) times daily.  . Glucosamine-Chondroitin (GLUCOSAMINE CHONDR COMPLEX PO) Take 1 tablet by mouth 2 (two) times daily.  Marland Kitchen ibuprofen (ADVIL,MOTRIN) 200 MG tablet You can take 2-3 tablets every 6 hours as needed for pain safely. Do not exceed this amount.  You can alternate with plain Tylenol(acetaminophen) or the prescribed pain medicine.  You can buy this over the counter at any drug store.  . magnesium 30 MG tablet Take 30 mg by mouth daily.  . Multiple Vitamin (MULITIVITAMIN WITH MINERALS) TABS Take 1 tablet by mouth daily.  . [DISCONTINUED] fluocinonide cream (LIDEX) 0.05 % Apply topically 2 (two) times daily. (Patient taking differently: Apply topically as  needed. )    Review of Systems  Constitutional: Negative for chills and fever.  Respiratory: Negative for cough and shortness of breath.   Musculoskeletal:       Foot pain   Skin:       Skin tag thigh, two on back     Objective:  BP 122/78 (BP Location: Left Arm)   Pulse 73   Temp (!) 97.2 F (36.2 C) (Temporal)   Wt 229 lb 14.4 oz (104.3 kg)   SpO2 96%   BMI 25.90 kg/m   Weight: 229 lb 14.4 oz (104.3 kg)   BP Readings from Last 3 Encounters:  03/29/19 122/78  03/20/19 100/60  08/14/17 (!) 90/50   Wt Readings from Last 3 Encounters:  03/29/19 229 lb 14.4 oz (104.3 kg)  03/20/19 228 lb 6.4 oz (103.6 kg)  08/14/17 239 lb 8 oz (108.6 kg)    Physical Exam Constitutional:      Appearance: Normal appearance.  Pulmonary:     Effort: Pulmonary effort is normal.  Musculoskeletal:     Comments: Some tenderness 4th/5th metatarsals dorsal and midfoot. No edema. There is some loss of arch with standing that is more pronounced on the left foot.    Shave Biopsy Procedure Note  Pre-operative Diagnosis: skin tag posterior thigh 0.5cm; seborrheic keratosis left upper back 0.5cm, skin tag lower back 0.25cm  Post-operative Diagnosis: same  Locations: see above  Anesthesia: Lidocaine 1% w epinephrine  Procedure Details  Patient informed of the risks, including bleeding and infection, and benefits of the  procedure and Verbal informed consent obtained. The lesions and surrounding area was given a sterile prep using chloraprep and draped in the usual sterile fashion. The skin lesions were shaved superficially using a dermablade and all three were completely removed.  Hemostasis of biopsy site was achieved using aluminum chloride. Sterile dressings applied. The specimens were sent for pathologic examination. The patient tolerated the procedure well.  Complications: none.  Plan: 1. Instructed to keep the wounds dry and covered for 24 hrs and clean thereafter with soap and water.   But no chlorine hot tubs or pool exposure to surgical site.  2. Warning signs of infection were reviewed.   3. Recommended that the patient use OTC analgesics as needed for pain.  4. Will contact patient with pathology results when obtained.     Assessment/Plan  1. Acquired skin tag Will send for path due to large size x 2 (back and thigh) - PR EXC SKIN BENIG <0.5 CM TRUNK,ARM,LEG - lidocaine-EPINEPHrine (XYLOCAINE-EPINEPHrine) 1 %-1:200000 (PF) injection 3 mL  2. Left foot pain Sending for xray; follow up pending result. Consider podiatry ref - DG Foot Complete Left; Future - DG Foot Complete Left  3. Neoplasm of uncertain behavior Left upper back skin tag with rough surface/?seborrheic keratosis? Will send for path.    Return if symptoms worsen or fail to improve.    Micheline Rough, MD

## 2019-04-01 ENCOUNTER — Encounter: Payer: Self-pay | Admitting: Family Medicine

## 2019-04-01 NOTE — Telephone Encounter (Signed)
See results note. 

## 2019-04-01 NOTE — Addendum Note (Signed)
Addended by: Agnes Lawrence on: 04/01/2019 03:25 PM   Modules accepted: Orders

## 2019-04-09 ENCOUNTER — Other Ambulatory Visit: Payer: Self-pay

## 2019-04-09 ENCOUNTER — Encounter: Payer: Self-pay | Admitting: Podiatry

## 2019-04-09 ENCOUNTER — Ambulatory Visit: Payer: Medicare Other | Admitting: Podiatry

## 2019-04-09 DIAGNOSIS — M79673 Pain in unspecified foot: Secondary | ICD-10-CM | POA: Diagnosis not present

## 2019-04-09 DIAGNOSIS — M722 Plantar fascial fibromatosis: Secondary | ICD-10-CM | POA: Diagnosis not present

## 2019-04-09 MED ORDER — MELOXICAM 15 MG PO TABS: 15.0000 mg | ORAL_TABLET | Freq: Every day | ORAL | 0 refills | Status: DC

## 2019-04-09 NOTE — Patient Instructions (Addendum)
If was nice to meet you today. If you have any questions or any further concerns, please feel fee to give me a call. You can call our office at [phone] or please feel fee to send me a message through [facility].   ----  Meloxicam tablets-TAKE AS NEEDED What is this medicine? MELOXICAM (mel OX i cam) is a non-steroidal anti-inflammatory drug (NSAID). It is used to reduce swelling and to treat pain. It may be used for osteoarthritis, rheumatoid arthritis, or juvenile rheumatoid arthritis. This medicine may be used for other purposes; ask your health care provider or pharmacist if you have questions. COMMON BRAND NAME(S): Mobic What should I tell my health care provider before I take this medicine? They need to know if you have any of these conditions:  bleeding disorders  cigarette smoker  coronary artery bypass graft (CABG) surgery within the past 2 weeks  drink more than 3 alcohol-containing drinks per day  heart disease  high blood pressure  history of stomach bleeding  kidney disease  liver disease  lung or breathing disease, like asthma  stomach or intestine problems  an unusual or allergic reaction to meloxicam, aspirin, other NSAIDs, other medicines, foods, dyes, or preservatives  pregnant or trying to get pregnant  breast-feeding How should I use this medicine? Take this medicine by mouth with a full glass of water. Follow the directions on the prescription label. You can take it with or without food. If it upsets your stomach, take it with food. Take your medicine at regular intervals. Do not take it more often than directed. Do not stop taking except on your doctor's advice. A special MedGuide will be given to you by the pharmacist with each prescription and refill. Be sure to read this information carefully each time. Talk to your pediatrician regarding the use of this medicine in children. While this drug may be prescribed for selected conditions, precautions do  apply. Patients over 82 years old may have a stronger reaction and need a smaller dose. Overdosage: If you think you have taken too much of this medicine contact a poison control center or emergency room at once. NOTE: This medicine is only for you. Do not share this medicine with others. What if I miss a dose? If you miss a dose, take it as soon as you can. If it is almost time for your next dose, take only that dose. Do not take double or extra doses. What may interact with this medicine? Do not take this medicine with any of the following medications:  cidofovir  ketorolac This medicine may also interact with the following medications:  aspirin and aspirin-like medicines  certain medicines for blood pressure, heart disease, irregular heart beat  certain medicines for depression, anxiety, or psychotic disturbances  certain medicines that treat or prevent blood clots like warfarin, enoxaparin, dalteparin, apixaban, dabigatran, rivaroxaban  cyclosporine  diuretics  fluconazole  lithium  methotrexate  other NSAIDs, medicines for pain and inflammation, like ibuprofen and naproxen  pemetrexed This list may not describe all possible interactions. Give your health care provider a list of all the medicines, herbs, non-prescription drugs, or dietary supplements you use. Also tell them if you smoke, drink alcohol, or use illegal drugs. Some items may interact with your medicine. What should I watch for while using this medicine? Tell your doctor or healthcare provider if your symptoms do not start to get better or if they get worse. This medicine may cause serious skin reactions. They can happen  weeks to months after starting the medicine. Contact your healthcare provider right away if you notice fevers or flu-like symptoms with a rash. The rash may be red or purple and then turn into blisters or peeling of the skin. Or, you might notice a red rash with swelling of the face, lips or  lymph nodes in your neck or under your arms. Do not take other medicines that contain aspirin, ibuprofen, or naproxen with this medicine. Side effects such as stomach upset, nausea, or ulcers may be more likely to occur. Many medicines available without a prescription should not be taken with this medicine. This medicine can cause ulcers and bleeding in the stomach and intestines at any time during treatment. This can happen with no warning and may cause death. There is increased risk with taking this medicine for a long time. Smoking, drinking alcohol, older age, and poor health can also increase risks. Call your doctor right away if you have stomach pain or blood in your vomit or stool. This medicine does not prevent heart attack or stroke. In fact, this medicine may increase the chance of a heart attack or stroke. The chance may increase with longer use of this medicine and in people who have heart disease. If you take aspirin to prevent heart attack or stroke, talk with your doctor or healthcare provider. What side effects may I notice from receiving this medicine? Side effects that you should report to your doctor or health care professional as soon as possible:  allergic reactions like skin rash, itching or hives, swelling of the face, lips, or tongue  nausea, vomiting  redness, blistering, peeling, or loosening of the skin, including inside the mouth  signs and symptoms of a blood clot such as breathing problems; changes in vision; chest pain; severe, sudden headache; pain, swelling, warmth in the leg; trouble speaking; sudden numbness or weakness of the face, arm, or leg  signs and symptoms of bleeding such as bloody or black, tarry stools; red or dark-brown urine; spitting up blood or brown material that looks like coffee grounds; red spots on the skin; unusual bruising or bleeding from the eye, gums, or nose  signs and symptoms of liver injury like dark yellow or brown urine; general ill  feeling or flu-like symptoms; light-colored stools; loss of appetite; nausea; right upper belly pain; unusually weak or tired; yellowing of the eyes or skin  signs and symptoms of stroke like changes in vision; confusion; trouble speaking or understanding; severe headaches; sudden numbness or weakness of the face, arm, or leg; trouble walking; dizziness; loss of balance or coordination Side effects that usually do not require medical attention (report to your doctor or health care professional if they continue or are bothersome):  constipation  diarrhea  gas This list may not describe all possible side effects. Call your doctor for medical advice about side effects. You may report side effects to FDA at 1-800-FDA-1088. Where should I keep my medicine? Keep out of the reach of children. Store at room temperature between 15 and 30 degrees C (59 and 86 degrees F). Throw away any unused medicine after the expiration date. NOTE: This sheet is a summary. It may not cover all possible information. If you have questions about this medicine, talk to your doctor, pharmacist, or health care provider.  2020 Elsevier/Gold Standard (2018-07-11 11:21:28)   Plantar Fasciitis (Heel Spur Syndrome) with Rehab The plantar fascia is a fibrous, ligament-like, soft-tissue structure that spans the bottom of the foot. Plantar  fasciitis is a condition that causes pain in the foot due to inflammation of the tissue. SYMPTOMS   Pain and tenderness on the underneath side of the foot.  Pain that worsens with standing or walking. CAUSES  Plantar fasciitis is caused by irritation and injury to the plantar fascia on the underneath side of the foot. Common mechanisms of injury include:  Direct trauma to bottom of the foot.  Damage to a small nerve that runs under the foot where the main fascia attaches to the heel bone.  Stress placed on the plantar fascia due to bone spurs. RISK INCREASES WITH:   Activities that  place stress on the plantar fascia (running, jumping, pivoting, or cutting).  Poor strength and flexibility.  Improperly fitted shoes.  Tight calf muscles.  Flat feet.  Failure to warm-up properly before activity.  Obesity. PREVENTION  Warm up and stretch properly before activity.  Allow for adequate recovery between workouts.  Maintain physical fitness:  Strength, flexibility, and endurance.  Cardiovascular fitness.  Maintain a health body weight.  Avoid stress on the plantar fascia.  Wear properly fitted shoes, including arch supports for individuals who have flat feet.  PROGNOSIS  If treated properly, then the symptoms of plantar fasciitis usually resolve without surgery. However, occasionally surgery is necessary.  RELATED COMPLICATIONS   Recurrent symptoms that may result in a chronic condition.  Problems of the lower back that are caused by compensating for the injury, such as limping.  Pain or weakness of the foot during push-off following surgery.  Chronic inflammation, scarring, and partial or complete fascia tear, occurring more often from repeated injections.  TREATMENT  Treatment initially involves the use of ice and medication to help reduce pain and inflammation. The use of strengthening and stretching exercises may help reduce pain with activity, especially stretches of the Achilles tendon. These exercises may be performed at home or with a therapist. Your caregiver may recommend that you use heel cups of arch supports to help reduce stress on the plantar fascia. Occasionally, corticosteroid injections are given to reduce inflammation. If symptoms persist for greater than 6 months despite non-surgical (conservative), then surgery may be recommended.   MEDICATION   If pain medication is necessary, then nonsteroidal anti-inflammatory medications, such as aspirin and ibuprofen, or other minor pain relievers, such as acetaminophen, are often  recommended.  Do not take pain medication within 7 days before surgery.  Prescription pain relievers may be given if deemed necessary by your caregiver. Use only as directed and only as much as you need.  Corticosteroid injections may be given by your caregiver. These injections should be reserved for the most serious cases, because they may only be given a certain number of times.  HEAT AND COLD  Cold treatment (icing) relieves pain and reduces inflammation. Cold treatment should be applied for 10 to 15 minutes every 2 to 3 hours for inflammation and pain and immediately after any activity that aggravates your symptoms. Use ice packs or massage the area with a piece of ice (ice massage).  Heat treatment may be used prior to performing the stretching and strengthening activities prescribed by your caregiver, physical therapist, or athletic trainer. Use a heat pack or soak the injury in warm water.  SEEK IMMEDIATE MEDICAL CARE IF:  Treatment seems to offer no benefit, or the condition worsens.  Any medications produce adverse side effects.  EXERCISES- RANGE OF MOTION (ROM) AND STRETCHING EXERCISES - Plantar Fasciitis (Heel Spur Syndrome) These exercises may help  you when beginning to rehabilitate your injury. Your symptoms may resolve with or without further involvement from your physician, physical therapist or athletic trainer. While completing these exercises, remember:   Restoring tissue flexibility helps normal motion to return to the joints. This allows healthier, less painful movement and activity.  An effective stretch should be held for at least 30 seconds.  A stretch should never be painful. You should only feel a gentle lengthening or release in the stretched tissue.  RANGE OF MOTION - Toe Extension, Flexion  Sit with your right / left leg crossed over your opposite knee.  Grasp your toes and gently pull them back toward the top of your foot. You should feel a stretch on  the bottom of your toes and/or foot.  Hold this stretch for 10 seconds.  Now, gently pull your toes toward the bottom of your foot. You should feel a stretch on the top of your toes and or foot.  Hold this stretch for 10 seconds. Repeat  times. Complete this stretch 3 times per day.   RANGE OF MOTION - Ankle Dorsiflexion, Active Assisted  Remove shoes and sit on a chair that is preferably not on a carpeted surface.  Place right / left foot under knee. Extend your opposite leg for support.  Keeping your heel down, slide your right / left foot back toward the chair until you feel a stretch at your ankle or calf. If you do not feel a stretch, slide your bottom forward to the edge of the chair, while still keeping your heel down.  Hold this stretch for 10 seconds. Repeat 3 times. Complete this stretch 2 times per day.   STRETCH  Gastroc, Standing  Place hands on wall.  Extend right / left leg, keeping the front knee somewhat bent.  Slightly point your toes inward on your back foot.  Keeping your right / left heel on the floor and your knee straight, shift your weight toward the wall, not allowing your back to arch.  You should feel a gentle stretch in the right / left calf. Hold this position for 10 seconds. Repeat 3 times. Complete this stretch 2 times per day.  STRETCH  Soleus, Standing  Place hands on wall.  Extend right / left leg, keeping the other knee somewhat bent.  Slightly point your toes inward on your back foot.  Keep your right / left heel on the floor, bend your back knee, and slightly shift your weight over the back leg so that you feel a gentle stretch deep in your back calf.  Hold this position for 10 seconds. Repeat 3 times. Complete this stretch 2 times per day.  STRETCH  Gastrocsoleus, Standing  Note: This exercise can place a lot of stress on your foot and ankle. Please complete this exercise only if specifically instructed by your caregiver.   Place  the ball of your right / left foot on a step, keeping your other foot firmly on the same step.  Hold on to the wall or a rail for balance.  Slowly lift your other foot, allowing your body weight to press your heel down over the edge of the step.  You should feel a stretch in your right / left calf.  Hold this position for 10 seconds.  Repeat this exercise with a slight bend in your right / left knee. Repeat 3 times. Complete this stretch 2 times per day.   STRENGTHENING EXERCISES - Plantar Fasciitis (Heel Spur Syndrome)  These exercises may help you when beginning to rehabilitate your injury. They may resolve your symptoms with or without further involvement from your physician, physical therapist or athletic trainer. While completing these exercises, remember:   Muscles can gain both the endurance and the strength needed for everyday activities through controlled exercises.  Complete these exercises as instructed by your physician, physical therapist or athletic trainer. Progress the resistance and repetitions only as guided.  STRENGTH - Towel Curls  Sit in a chair positioned on a non-carpeted surface.  Place your foot on a towel, keeping your heel on the floor.  Pull the towel toward your heel by only curling your toes. Keep your heel on the floor. Repeat 3 times. Complete this exercise 2 times per day.  STRENGTH - Ankle Inversion  Secure one end of a rubber exercise band/tubing to a fixed object (table, pole). Loop the other end around your foot just before your toes.  Place your fists between your knees. This will focus your strengthening at your ankle.  Slowly, pull your big toe up and in, making sure the band/tubing is positioned to resist the entire motion.  Hold this position for 10 seconds.  Have your muscles resist the band/tubing as it slowly pulls your foot back to the starting position. Repeat 3 times. Complete this exercises 2 times per day.  Document Released:  04/11/2005 Document Revised: 07/04/2011 Document Reviewed: 07/24/2008 [facility] Patient Information 2014 [place], [place].  WEARING INSTRUCTIONS FOR ORTHOTICS  Don't expect to be comfortable wearing your orthotic devices for the first time.  Like eyeglasses, you may be aware of them as time passes, they will not be uncomfortable and you will enjoy wearing them.  FOLLOW THESE INSTRUCTIONS EXACTLY!  1. Wear your orthotic devices for:       Not more than 1 hour the first day.       Not more than 2 hours the second day.       Not more than 3 hours the third day and so on.        Or wear them for as long as they feel comfortable.       If you experience discomfort in your feet or legs take them out.  When feet & legs feel       better, put them back in.  You do need to be consistent and wear them a little        everyday. 2.   If at any time the orthotic devices become acutely uncomfortable before the       time for that particular day, STOP WEARING THEM. 3.   On the next day, do not increase the wearing time. 4.   Subsequently, increase the wearing time by 15-30 minutes only if comfortable to do       so. 5.   You will be seen by your doctor about 2-4 weeks after you receive your orthotic       devices, at which time you will probably be wearing your devices comfortably        for about 8 hours or more a day. 6.   Some patients occasionally report mild aches or discomfort in other parts of the of       body such as the knees, hips or back after 3 or 4 consecutive hours of wear.  If this       is the case with you, do not extend your wearing time.  Instead, cut it back an  hour or       two.  In all likelihood, these symptoms will disappear in a short period of time as your       body posture realigns itself and functions more efficiently. 7.   It is possible that your orthotic device may require some small changes or adjustment       to improve their function or make them more comfortable.    This is usually not done       before one to three months have elapsed.  These adjustments are made in        accordance with the changed position your feet are assuming as a result of       improved biomechanical function. 8.   In women's shoes, it's not unusual for your heel to slip out of the shoe, particularly if       they are step-in-shoes.  If this is the case, try other shoes or other styles.  Try to       purchase shoes which have deeper heal seats or higher heel counters. 9.   Squeaking of orthotics devices in the shoes is due to the movement of the devices       when they are functioning normally.  To eliminate squeaking, simply dust some       baby powder into your shoes before inserting the devices.  If this does not work,        apply soap or wax to the edges of the orthotic devices or put a tissue into the shoes. 10. It is important that you follow these directions explicitly.  Failure to do so will simply       prolong the adjustment period or create problems which are easily avoided.  It makes       no difference if you are wearing your orthotic devices for only a few hours after        several months, so long as you are wearing them comfortably for those hours. 11. If you have any questions or complaints, contact our office.  We have no way of       knowing about your problems unless you tell us.  If we do not hear from you, we will       assume that you are proceeding well.

## 2019-04-10 NOTE — Progress Notes (Signed)
Subjective:   Patient ID: Jeffrey [name], male   DOB: 63 y.o.   MRN: [id number]   HPI 63 year old male presents the office today for concerns of pain in the arch of his left foot.  He states he is a Psychologist, occupational at the hospital after being on his feet for hours he describes discomfort or if he is been working the yard on uneven surfaces.  He denies any recent injury denies any swelling.  No redness.  No numbness or tingling or any radiating pain.  Patient will feel like he is getting swelling in the ball of his feet but no other issues.   Review of Systems  All other systems reviewed and are negative.  Past Medical History:  Diagnosis Date  . Abnormal Doppler ultrasound of carotid artery 07-06-2012   normal carotid doppler  . Acute appendicitis 03/30/2017  . AF (atrial fibrillation) ([place])   . Appendicitis, acute 03/30/2017  . Chronic knee pain 07/23/2015  . H/O echocardiogram 06-26-2012   Transthoracic echo    EF 55-60%  . H/O prior ablation treatment 2011  . History of atrial flutter   . History of cardiac monitoring 06-15-2012   sinus   . Hx of bee sting allergy 07/23/2015  . Normal cardiac stress test 05-24-2007   low risk scan    Past Surgical History:  Procedure Laterality Date  . ATRIAL FIBRILLATION ABLATION    . LAPAROSCOPIC APPENDECTOMY N/A 03/30/2017   Procedure: APPENDECTOMY LAPAROSCOPIC;  Surgeon: Armandina Gemma, MD;  Location: [facility];  Service: General;  Laterality: N/A;  . left testicle removed (no cancer, benign mass)    . othroscopic knee surgery bil    . vericose veins stripped both legs       Current Outpatient Medications:  .  aspirin 325 MG tablet, Take 325 mg by mouth daily.  , Disp: , Rfl:  .  citalopram (CELEXA) 10 MG tablet, Take 1 tablet (10 mg total) by mouth daily., Disp: 90 tablet, Rfl: 3 .  EPINEPHrine 0.3 mg/0.3 mL IJ SOAJ injection, Inject 0.3 mLs (0.3 mg total) into the muscle as needed for anaphylaxis., Disp: 1 each, Rfl: 2 .  fexofenadine (ALLEGRA) 180  MG tablet, Take 180 mg by mouth daily.  , Disp: , Rfl:  .  fish oil-omega-3 fatty acids 1000 MG capsule, Take 1 g by mouth daily., Disp: , Rfl:  .  fluocinonide cream (LIDEX) 0.05 %, Apply topically 2 (two) times daily., Disp: 60 g, Rfl: 2 .  Glucosamine-Chondroitin (GLUCOSAMINE CHONDR COMPLEX PO), Take 1 tablet by mouth 2 (two) times daily., Disp: , Rfl:  .  ibuprofen (ADVIL,MOTRIN) 200 MG tablet, You can take 2-3 tablets every 6 hours as needed for pain safely. Do not exceed this amount.  You can alternate with plain Tylenol(acetaminophen) or the prescribed pain medicine.  You can buy this over the counter at any drug store., Disp: , Rfl:  .  magnesium 30 MG tablet, Take 30 mg by mouth daily., Disp: , Rfl:  .  meloxicam (MOBIC) 15 MG tablet, Take 1 tablet (15 mg total) by mouth daily., Disp: 14 tablet, Rfl: 0 .  Multiple Vitamin (MULITIVITAMIN WITH MINERALS) TABS, Take 1 tablet by mouth daily., Disp: , Rfl:   Allergies  Allergen Reactions  . Bee Venom Anaphylaxis  . Iodine Solution [Povidone Iodine] Itching    Patient states his arm was irritated following a previous arm surgery where he had been scrubbed with betadine.  Objective:  Physical Exam  General: AAO x3, NAD  Dermatological: Skin is warm, dry and supple bilateral. Nails x 10 are well manicured; remaining integument appears unremarkable at this time. There are no open sores, no preulcerative lesions, no rash or signs of infection present.  Vascular: Dorsalis Pedis artery and Posterior Tibial artery pedal pulses are 2/4 bilateral with immedate capillary fill time. Pedal hair growth present. No varicosities and no lower extremity edema present bilateral. There is no pain with calf compression, swelling, warmth, erythema.   Neruologic: Grossly intact via light touch bilateral.  Protective threshold with Semmes Wienstein monofilament intact to all pedal sites bilateral.  Negative Tinel sign.  Musculoskeletal: There is a  decrease in medial arch height upon weightbearing.  Tenderness palpation on the medial band plantar fashion the arch of the foot.  No pain with Achilles tendon.  Plantar fascial, Achilles tendon appear to be intact.  No area of pinpoint tenderness.  No edema.  Prominent metatarsal heads plantarly without any significant discomfort.  Muscular strength 5/5 in all groups tested bilateral.  Gait: Unassisted, Nonantalgic.       Assessment:   Bilateral plantar fasciitis, flatfoot; metatarsalgia     Plan:  -Treatment options discussed including all alternatives, risks, and complications -Etiology of symptoms were discussed -Previous x-rays were reviewed by his primary care physician. -Prescribed mobic to take as needed. Discussed side effects of the medication and directed to stop if any are to occur and call the office.  -Dispensed power step inserts.  Discussed stretching, icing daily.  Continue with supportive shoe gear as well.    Trula Slade DPM

## 2019-10-07 ENCOUNTER — Other Ambulatory Visit: Payer: Self-pay

## 2019-10-07 ENCOUNTER — Ambulatory Visit (INDEPENDENT_AMBULATORY_CARE_PROVIDER_SITE_OTHER): Payer: Medicare Other | Admitting: Internal Medicine

## 2019-10-07 ENCOUNTER — Ambulatory Visit (INDEPENDENT_AMBULATORY_CARE_PROVIDER_SITE_OTHER)
Admission: RE | Admit: 2019-10-07 | Discharge: 2019-10-07 | Disposition: A | Discharge status: Home / Self Care | Payer: Medicare Other | Source: Ambulatory Visit | Attending: Internal Medicine | Admitting: Internal Medicine

## 2019-10-07 ENCOUNTER — Encounter: Payer: Self-pay | Admitting: Internal Medicine

## 2019-10-07 VITALS — BP 120/66 | HR 71 | Temp 97.1°F | Ht 79.0 in | Wt 230.2 lb

## 2019-10-07 DIAGNOSIS — S6992XA Unspecified injury of left wrist, hand and finger(s), initial encounter: Secondary | ICD-10-CM | POA: Diagnosis not present

## 2019-10-07 DIAGNOSIS — S62522A Displaced fracture of distal phalanx of left thumb, initial encounter for closed fracture: Secondary | ICD-10-CM | POA: Diagnosis not present

## 2019-10-07 DIAGNOSIS — S61012A Laceration without foreign body of left thumb without damage to nail, initial encounter: Secondary | ICD-10-CM

## 2019-10-07 NOTE — Progress Notes (Signed)
Chief Complaint  Patient presents with  . Finger Injury    Left thumb injury, happened 10/02/19    HPI: Jeffrey [name] 64 y.o. come in for SDA PCP NA   Hit left thumb split "wide open" after hammer  Hitting   Bled  Cleaned and didn't want to go to ed  And had to leave town to [place] last week . Since then has wriapped and used neoporing still oozing blood at times  .   Is right handed .   ROS: See pertinent positives and negatives per HPI.  Past Medical History:  Diagnosis Date  . Abnormal Doppler ultrasound of carotid artery 07-06-2012   normal carotid doppler  . Acute appendicitis 03/30/2017  . AF (atrial fibrillation) ([place])   . Appendicitis, acute 03/30/2017  . Chronic knee pain 07/23/2015  . H/O echocardiogram 06-26-2012   Transthoracic echo    EF 55-60%  . H/O prior ablation treatment 2011  . History of atrial flutter   . History of cardiac monitoring 06-15-2012   sinus   . Hx of bee sting allergy 07/23/2015  . Normal cardiac stress test 05-24-2007   low risk scan    Family History  Problem Relation Age of Onset  . Alcohol abuse Mother   . Healthy Mother   . Alcohol abuse Father   . COPD Father        IT trainer, smoker  . Arthritis Maternal Grandmother   . Cancer Maternal Grandmother        ?abdominal  . Breast cancer Sister   . Alzheimer's disease Maternal Grandfather   . Heart attack Paternal Grandfather     Social History   Socioeconomic History  . Marital status: Married    Spouse name: Not on file  . Number of children: Not on file  . Years of education: Not on file  . Highest education level: Not on file  Occupational History  . Occupation: Freight forwarder  Tobacco Use  . Smoking status: Former Smoker    Packs/day: 1.50    Years: 8.00    Pack years: 12.00    Types: Cigarettes    Quit date: 07/11/1977    Years since quitting: 42.2  . Smokeless tobacco: Never Used  Substance and Sexual Activity  . Alcohol use: Yes    Alcohol/week: 21.0 standard  drinks    Types: 21 Standard drinks or equivalent per week  . Drug use: No  . Sexual activity: Not on file  Other Topics Concern  . Not on file  Social History Narrative   Work or School: [employer], [place] Situation: lives with wife      Spiritual Beliefs: none      Lifestyle: gym 3 days per week; diet is healthy      Social Determinants of Radio broadcast assistant Strain:   . Difficulty of Paying Living Expenses:   Food Insecurity:   . Worried About Charity fundraiser in the Last Year:   . Arboriculturist in the Last Year:   Transportation Needs:   . Film/video editor (Medical):   Marland Kitchen Lack of Transportation (Non-Medical):   Physical Activity:   . Days of Exercise per Week:   . Minutes of Exercise per Session:   Stress:   . Feeling of Stress :   Social Connections:   . Frequency of Communication with Friends and Family:   . Frequency of Social Gatherings with  Friends and Family:   . Attends Religious Services:   . Active Member of Clubs or Organizations:   . Attends Archivist Meetings:   Marland Kitchen Marital Status:     Outpatient Medications Prior to Visit  Medication Sig Dispense Refill  . citalopram (CELEXA) 10 MG tablet Take 1 tablet (10 mg total) by mouth daily. 90 tablet 3  . EPINEPHrine 0.3 mg/0.3 mL IJ SOAJ injection Inject 0.3 mLs (0.3 mg total) into the muscle as needed for anaphylaxis. 1 each 2  . fexofenadine (ALLEGRA) 180 MG tablet Take 180 mg by mouth daily.      . fish oil-omega-3 fatty acids 1000 MG capsule Take 1 g by mouth daily.    . fluocinonide cream (LIDEX) 0.05 % Apply topically 2 (two) times daily. 60 g 2  . Glucosamine-Chondroitin (GLUCOSAMINE CHONDR COMPLEX PO) Take 1 tablet by mouth 2 (two) times daily.    Marland Kitchen ibuprofen (ADVIL,MOTRIN) 200 MG tablet You can take 2-3 tablets every 6 hours as needed for pain safely. Do not exceed this amount.  You can alternate with plain Tylenol(acetaminophen) or the prescribed pain  medicine.  You can buy this over the counter at any drug store.    . magnesium 30 MG tablet Take 30 mg by mouth daily.    . Multiple Vitamin (MULITIVITAMIN WITH MINERALS) TABS Take 1 tablet by mouth daily.    Marland Kitchen aspirin 325 MG tablet Take 325 mg by mouth daily.   (Patient not taking: Reported on 10/07/2019)    . meloxicam (MOBIC) 15 MG tablet Take 1 tablet (15 mg total) by mouth daily. (Patient not taking: Reported on 10/07/2019) 14 tablet 0   No facility-administered medications prior to visit.     EXAM:  BP 120/66   Pulse 71   Temp (!) 97.1 F (36.2 C) (Temporal)   Ht 6\' 7"  (2.007 m)   Wt 230 lb 3.2 oz (104.4 kg)   SpO2 97%   BMI 25.93 kg/m   Body mass index is 25.93 kg/m.  GENERAL: vitals reviewed and listed above, alert, oriented, appears well hydrated and in no acute distress HEENT: atraumatic, conjunctiva  clear, no obvious abnormalities on inspection of external nose and ears OP masked MS: moves all extremities left thumb with curvilinear laceration at least 2 mm dep left thumb to dip joint and mild blood   1+ swelling but no streaking redness   Nail intact  .  PSYCH: pleasant and cooperative, no obvious depression or anxiety  BP Readings from Last 3 Encounters:  10/07/19 120/66  03/29/19 122/78  03/20/19 100/60    ASSESSMENT AND PLAN:  Discussed the following assessment and plan:  Thumb injury, left, initial encounter - Plan: DG Finger Thumb Left, Ambulatory referral to Hand Surgery  Laceration of skin of left thumb, initial encounter - Plan: Ambulatory referral to Hand Surgery laceration and crush mechanism injury   R/o fracture    5-6 days into injury  Wound  Skin with separation  Some inversion.   Td utd   2019 ,  X ray today and hand surgery referral /opinion  About best management for healing  ? secondary intention or suture ing?  late  Wrapped with dressing and coban  Pressure  By cma  -Patient advised to return or notify health care team  if  new concerns  arise.  Patient Instructions  Your tdap is up  to date  2019  Get x ray we are doing referral to hand specialist for  best advice on healing  At risk for infection.   Standley Brooking. Shalinda Burkholder M.D.

## 2019-10-07 NOTE — Progress Notes (Signed)
So there is a fracture of your thumb proceed with referral to hand surgeon let me know if you haven't been contacted about appt

## 2019-10-07 NOTE — Patient Instructions (Signed)
Your tdap is up  to date  2019  Get x ray we are doing referral to hand specialist for best advice on healing  At risk for infection.

## 2019-10-08 DIAGNOSIS — S62502A Fracture of unspecified phalanx of left thumb, initial encounter for closed fracture: Secondary | ICD-10-CM | POA: Diagnosis not present

## 2019-10-08 DIAGNOSIS — S62502D Fracture of unspecified phalanx of left thumb, subsequent encounter for fracture with routine healing: Secondary | ICD-10-CM | POA: Diagnosis not present

## 2019-10-15 DIAGNOSIS — S62502D Fracture of unspecified phalanx of left thumb, subsequent encounter for fracture with routine healing: Secondary | ICD-10-CM | POA: Diagnosis not present

## 2019-10-15 DIAGNOSIS — S62502A Fracture of unspecified phalanx of left thumb, initial encounter for closed fracture: Secondary | ICD-10-CM | POA: Diagnosis not present

## 2020-01-14 ENCOUNTER — Other Ambulatory Visit: Payer: Self-pay | Admitting: Family Medicine

## 2020-02-07 ENCOUNTER — Encounter: Payer: Self-pay | Admitting: Family Medicine

## 2020-02-13 ENCOUNTER — Ambulatory Visit (INDEPENDENT_AMBULATORY_CARE_PROVIDER_SITE_OTHER): Payer: Medicare Other | Admitting: Family Medicine

## 2020-02-13 ENCOUNTER — Encounter: Payer: Self-pay | Admitting: Family Medicine

## 2020-02-13 ENCOUNTER — Other Ambulatory Visit: Payer: Self-pay

## 2020-02-13 VITALS — BP 118/74 | HR 78 | Temp 98.2°F | Wt 228.2 lb

## 2020-02-13 DIAGNOSIS — S00212A Abrasion of left eyelid and periocular area, initial encounter: Secondary | ICD-10-CM | POA: Diagnosis not present

## 2020-02-13 DIAGNOSIS — L03211 Cellulitis of face: Secondary | ICD-10-CM

## 2020-02-13 MED ORDER — SULFAMETHOXAZOLE-TRIMETHOPRIM 800-160 MG PO TABS: 1.0000 | ORAL_TABLET | Freq: Two times a day (BID) | ORAL | 0 refills | Status: AC

## 2020-02-13 NOTE — Progress Notes (Signed)
Subjective:    Patient ID: Jeffrey [name], male    DOB: 01-26-1956, 64 y.o.   MRN: [id number]  No chief complaint on file.   HPI Pt is a 64 yo male with pmh sig for afib s/p ablation, h/o bee sting allergy, GAD, and h/o pulmonary nodule followed by Dr. Ethlyn [name] seen for acute concern.  Pt endorses bump on left eyebrow x1 week after hitting head on the tailgate of the car.  Pt did not realize he cut his forehead initially.  Patient notes the area on his eyebrow becomes soft at night and firm during the day.  Patient denies drainage.  Patient endorses COVID-19 booster vaccine last week for which he had mild expected symptoms over the weekend including fatigue.  Patient recalls having a recent tetanus booster.  Past Medical History:  Diagnosis Date  . Abnormal Doppler ultrasound of carotid artery 07-06-2012   normal carotid doppler  . Acute appendicitis 03/30/2017  . AF (atrial fibrillation) ([place])   . Appendicitis, acute 03/30/2017  . Chronic knee pain 07/23/2015  . H/O echocardiogram 06-26-2012   Transthoracic echo    EF 55-60%  . H/O prior ablation treatment 2011  . History of atrial flutter   . History of cardiac monitoring 06-15-2012   sinus   . Hx of bee sting allergy 07/23/2015  . Normal cardiac stress test 05-24-2007   low risk scan    Allergies  Allergen Reactions  . Bee Venom Anaphylaxis  . Iodine Solution [Povidone Iodine] Itching    Patient states his arm was irritated following a previous arm surgery where he had been scrubbed with betadine.    ROS General: Denies fever, chills, night sweats, changes in weight, changes in appetite HEENT: Denies headaches, ear pain, changes in vision, rhinorrhea, sore throat CV: Denies CP, palpitations, SOB, orthopnea Pulm: Denies SOB, cough, wheezing GI: Denies abdominal pain, nausea, vomiting, diarrhea, constipation GU: Denies dysuria, hematuria, frequency Msk: Denies muscle cramps, joint pains Neuro: Denies weakness, numbness,  tingling Skin: Denies rashes, bruising  + bump on left eyebrow Psych: Denies depression, anxiety, hallucinations      Objective:    Blood pressure 118/74, pulse 78, temperature 98.2 F (36.8 C), temperature source Oral, weight 228 lb 3.2 oz (103.5 kg), SpO2 96 %.   Gen. Pleasant, well-nourished, in no distress, normal affect  HEENT: [place]/AT, face symmetric, conjunctiva clear, no scleral icterus, PERRLA, EOMI, nares patent without drainage Lungs: no accessory muscle use Cardiovascular: RRR, no peripheral edema Musculoskeletal: No deformities, no cyanosis or clubbing, normal tone Neuro:  A&Ox3, CN II-XII intact, normal gait Skin:  Warm, dry.  1 cm slightly raised area with eschar and adjacent pustule with surrounding perimeter of erythema superior to medial left eyebrow.  No induration, drainage, or skull depressions appreciated.  No periorbital TTP.  Wt Readings from Last 3 Encounters:  02/13/20 228 lb 3.2 oz (103.5 kg)  10/07/19 230 lb 3.2 oz (104.4 kg)  03/29/19 229 lb 14.4 oz (104.3 kg)    Lab Results  Component Value Date   WBC 4.4 03/20/2019   HGB 15.6 03/20/2019   HCT 45.7 03/20/2019   PLT 166.0 03/20/2019   GLUCOSE 90 03/20/2019   CHOL 168 03/20/2019   TRIG 102.0 03/20/2019   HDL 57.80 03/20/2019   LDLCALC 90 03/20/2019   ALT 21 03/20/2019   AST 21 03/20/2019   NA 137 03/20/2019   K 4.9 03/20/2019   CL 100 03/20/2019   CREATININE 0.92 03/20/2019   BUN 12 03/20/2019  CO2 32 03/20/2019   TSH 1.96 07/30/2014   PSA 0.85 03/20/2019   INR 1.07 09/06/2011   HGBA1C 5.1 08/14/2017    Assessment/Plan:  Cellulitis of face -Discussed supportive care including warm compress, keeping area clean and dry -Allergies reviewed -We will start Bactrim DS twice daily -Tdap up-to-date.  Done 04/25/2017 -Given strict precautions -Given handout - Plan: sulfamethoxazole-trimethoprim (BACTRIM DS) 800-160 MG tablet  Abrasion of left eyebrow -Healing with small area of  cellulitis -Continue supportive care -We will start Bactrim DS  F/u as needed  Jeffrey Mitts, MD

## 2020-02-13 NOTE — Patient Instructions (Signed)

## 2020-03-05 DIAGNOSIS — Z20822 Contact with and (suspected) exposure to covid-19: Secondary | ICD-10-CM | POA: Diagnosis not present

## 2020-03-06 ENCOUNTER — Other Ambulatory Visit: Payer: Self-pay

## 2020-03-06 ENCOUNTER — Ambulatory Visit (INDEPENDENT_AMBULATORY_CARE_PROVIDER_SITE_OTHER): Payer: Medicare Other

## 2020-03-06 ENCOUNTER — Encounter: Payer: Self-pay | Admitting: Family Medicine

## 2020-03-06 ENCOUNTER — Ambulatory Visit (INDEPENDENT_AMBULATORY_CARE_PROVIDER_SITE_OTHER): Payer: Medicare Other | Admitting: Family Medicine

## 2020-03-06 VITALS — BP 110/68 | HR 83 | Temp 98.5°F | Wt 229.2 lb

## 2020-03-06 DIAGNOSIS — M25471 Effusion, right ankle: Secondary | ICD-10-CM

## 2020-03-06 DIAGNOSIS — S93411A Sprain of calcaneofibular ligament of right ankle, initial encounter: Secondary | ICD-10-CM

## 2020-03-06 DIAGNOSIS — M7989 Other specified soft tissue disorders: Secondary | ICD-10-CM | POA: Diagnosis not present

## 2020-03-06 MED ORDER — NAPROXEN 500 MG PO TABS: 500.0000 mg | ORAL_TABLET | Freq: Two times a day (BID) | ORAL | 0 refills | Status: DC

## 2020-03-06 NOTE — Patient Instructions (Addendum)
Ankle Sprain  An ankle sprain is a stretch or tear in a ligament in the ankle. Ligaments are tissues that connect bones to each other. The two most common types of ankle sprains are:  Inversion sprain. This happens when the foot turns inward and the ankle rolls outward. It affects the ligament on the outside of the foot (lateral ligament).  Eversion sprain. This happens when the foot turns outward and the ankle rolls inward. It affects the ligament on the inner side of the foot (medial ligament). What are the causes? This condition is often caused by accidentally rolling or twisting the ankle. What increases the risk? You are more likely to develop this condition if you play sports. What are the signs or symptoms? Symptoms of this condition include:  Pain in your ankle.  Swelling.  Bruising. This may develop right after you sprain your ankle or 1-2 days later.  Trouble standing or walking, especially when you turn or change directions. How is this diagnosed? This condition is diagnosed with:  A physical exam. During the exam, your health care provider will press on certain parts of your foot and ankle and try to move them in certain ways.  X-ray imaging. These may be taken to see how severe the sprain is and to check for broken bones. How is this treated? This condition may be treated with:  A brace or splint. This is used to keep the ankle from moving until it heals.  An elastic bandage. This is used to support the ankle.  Crutches.  Pain medicine.  Surgery. This may be needed if the sprain is severe.  Physical therapy. This may help to improve the range of motion in the ankle. Follow these instructions at home: If you have a brace or a splint:  Wear the brace or splint as told by your health care provider. Remove it only as told by your health care provider.  Loosen the brace or splint if your toes tingle, become numb, or turn cold and blue.  Keep the brace or  splint clean.  If the brace or splint is not waterproof: ? Do not let it get wet. ? Cover it with a watertight covering when you take a bath or a shower. If you have an elastic bandage (dressing):  Remove it to shower or bathe.  Try not to move your ankle much, but wiggle your toes from time to time. This helps to prevent swelling.  Adjust the dressing to make it more comfortable if it feels too tight.  Loosen the dressing if you have numbness or tingling in your foot, or if your foot becomes cold and blue. Managing pain, stiffness, and swelling   Take over-the-counter and prescription medicines only as told by your health care provider.  For 2-3 days, keep your ankle raised (elevated) above the level of your heart as much as possible.  If directed, put ice on the injured area: ? If you have a removable brace or splint, remove it as told by your health care provider. ? Put ice in a plastic bag. ? Place a towel between your skin and the bag. ? Leave the ice on for 20 minutes, 2-3 times a day. General instructions  Rest your ankle.  Do not use the injured limb to support your body weight until your health care provider says that you can. Use crutches as told by your health care provider.  Do not use any products that contain nicotine or tobacco, such as  cigarettes, e-cigarettes, and chewing tobacco. If you need help quitting, ask your health care provider.  Keep all follow-up visits as told by your health care provider. This is important. Contact a health care provider if:  You have rapidly increasing bruising or swelling.  Your pain is not relieved with medicine. Get help right away if:  Your foot or toes become numb or blue.  You have severe pain that gets worse. Summary  An ankle sprain is a stretch or tear in a ligament in the ankle. Ligaments are tissues that connect bones to each other.  This condition is often caused by accidentally rolling or twisting the  ankle.  Symptoms include pain, swelling, bruising, and trouble walking.  To relieve pain and swelling, put ice on the affected ankle, raise your ankle above the level of your heart, and use an elastic bandage.  Keep all follow-up visits as told by your health care provider. This is important. This information is not intended to replace advice given to you by your health care provider. Make sure you discuss any questions you have with your health care provider. Document Revised: 01/01/2018 Document Reviewed: 09/05/2017 Elsevier Patient Education  [place].  Ankle Sprain, Phase I Rehab An ankle sprain is an injury to the ligaments of your ankle. Ankle sprains cause stiffness, loss of motion, and loss of strength. Ask your health care provider which exercises are safe for you. Do exercises exactly as told by your health care provider and adjust them as directed. It is normal to feel mild stretching, pulling, tightness, or discomfort as you do these exercises. Stop right away if you feel sudden pain or your pain gets worse. Do not begin these exercises until told by your health care provider. Stretching and range-of-motion exercises These exercises warm up your muscles and joints and improve the movement and flexibility of your lower leg and ankle. These exercises also help to relieve pain and stiffness. Gastroc and soleus stretch This exercise is also called a calf stretch. It stretches the muscles in the back of the lower leg. These muscles are the gastrocnemius, or gastroc, and the soleus. 1. Sit on the floor with your left / right leg extended. 2. Loop a belt or towel around the ball of your left / right foot. The ball of your foot is on the walking surface, right under your toes. 3. Keep your left / right ankle and foot relaxed and keep your knee straight while you use the belt or towel to pull your foot toward you. You should feel a gentle stretch behind your calf or knee in your  gastroc muscle. 4. Hold this position for __________ seconds, then release to the starting position. 5. Repeat the exercise with your knee bent. You can put a pillow or a rolled bath towel under your knee to support it. You should feel a stretch deep in your calf in the soleus muscle or at your Achilles tendon. Repeat __________ times. Complete this exercise __________ times a day. Ankle alphabet  1. Sit with your left / right leg supported at the lower leg. ? Do not rest your foot on anything. ? Make sure your foot has room to move freely. 2. Think of your left / right foot as a paintbrush. ? Move your foot to trace each letter of the alphabet in the air. Keep your hip and knee still while you trace. ? Make the letters as large as you can without feeling discomfort. 3. Trace every  letter from A to Z. Repeat __________ times. Complete this exercise __________ times a day. Strengthening exercises These exercises build strength and endurance in your ankle and lower leg. Endurance is the ability to use your muscles for a long time, even after they get tired. Ankle dorsiflexion  1. Secure a rubber exercise band or tube to an object, such as a table leg, that will stay still when the band is pulled. Secure the other end around your left / right foot. 2. Sit on the floor facing the object, with your left / right leg extended. The band or tube should be slightly tense when your foot is relaxed. 3. Slowly bring your foot toward you, bringing the top of your foot toward your shin (dorsiflexion), and pulling the band tighter. 4. Hold this position for __________ seconds. 5. Slowly return your foot to the starting position. Repeat __________ times. Complete this exercise __________ times a day. Ankle plantar flexion  1. Sit on the floor with your left / right leg extended. 2. Loop a rubber exercise tube or band around the ball of your left / right foot. The ball of your foot is on the walking  surface, right under your toes. ? Hold the ends of the band or tube in your hands. ? The band or tube should be slightly tense when your foot is relaxed. 3. Slowly point your foot and toes downward to tilt the top of your foot away from your shin (plantar flexion). 4. Hold this position for __________ seconds. 5. Slowly return your foot to the starting position. Repeat __________ times. Complete this exercise __________ times a day. Ankle eversion 1. Sit on the floor with your legs straight out in front of you. 2. Loop a rubber exercise band or tube around the ball of your left / right foot. The ball of your foot is on the walking surface, right under your toes. ? Hold the ends of the band in your hands, or secure the band to a stable object. ? The band or tube should be slightly tense when your foot is relaxed. 3. Slowly push your foot outward, away from your other leg (eversion). 4. Hold this position for __________ seconds. 5. Slowly return your foot to the starting position. Repeat __________ times. Complete this exercise __________ times a day. This information is not intended to replace advice given to you by your health care provider. Make sure you discuss any questions you have with your health care provider. Document Revised: 07/31/2018 Document Reviewed: 01/22/2018 Elsevier Patient Education  2020 [employer].

## 2020-03-06 NOTE — Progress Notes (Signed)
Subjective:    Patient ID: Jeffrey [name], male    DOB: [birth year], 64 y.o.   MRN: [id number]  No chief complaint on file.   HPI Patient was seen today for acute concern.  Pt states he twisted his R ankle today after stepping into a depression/area of unlevel ground in the back yard.  Pt notes edema and discomfort in lateral R ankle.  Unsure if heard any pops or tears.  Patient able to bear some weight after injury.  Patient expresses wanting to have ankle checked out as he is leaving for a 7 day trip to the [place] on Tuesday morning.  Past Medical History:  Diagnosis Date  . Abnormal Doppler ultrasound of carotid artery 07-06-2012   normal carotid doppler  . Acute appendicitis 03/30/2017  . AF (atrial fibrillation) ([place])   . Appendicitis, acute 03/30/2017  . Chronic knee pain 07/23/2015  . H/O echocardiogram 06-26-2012   Transthoracic echo    EF 55-60%  . H/O prior ablation treatment 2011  . History of atrial flutter   . History of cardiac monitoring 06-15-2012   sinus   . Hx of bee sting allergy 07/23/2015  . Normal cardiac stress test 05-24-2007   low risk scan    Allergies  Allergen Reactions  . Bee Venom Anaphylaxis  . Iodine Solution [Povidone Iodine] Itching    Patient states his arm was irritated following a previous arm surgery where he had been scrubbed with betadine.    ROS General: Denies fever, chills, night sweats, changes in weight, changes in appetite HEENT: Denies headaches, ear pain, changes in vision, rhinorrhea, sore throat CV: Denies CP, palpitations, SOB, orthopnea Pulm: Denies SOB, cough, wheezing GI: Denies abdominal pain, nausea, vomiting, diarrhea, constipation GU: Denies dysuria, hematuria, frequency, vaginal discharge Msk: Denies muscle cramps, joint pains  +R lateral ankle pain and edema Neuro: Denies weakness, numbness, tingling Skin: Denies rashes, bruising Psych: Denies depression, anxiety, hallucinations      Objective:    Blood pressure  110/68, pulse 83, temperature 98.5 F (36.9 C), temperature source Oral, weight 229 lb 3.2 oz (104 kg), SpO2 94 %.   Gen. Pleasant, well-nourished, in no distress, normal affect   HEENT: [place]/AT, face symmetric, conjunctiva clear, no scleral icterus, PERRLA, EOMI, nares patent without drainage Lungs: no accessory muscle use Cardiovascular: RRR, no peripheral edema Musculoskeletal: Ecchymosis and edema of lateral right ankle at malleolus.TTP of right lateral malleolus.  DP and PT pulses 2+ bilaterally.  Pain with inversion > eversion of right ankle.  No deformities, no cyanosis or clubbing, normal tone Neuro:  A&Ox3, CN II-XII intact, normal gait Skin:  Warm, no lesions/ rash.  Ecchymosis of right lateral malleolus.   Wt Readings from Last 3 Encounters:  03/06/20 229 lb 3.2 oz (104 kg)  02/13/20 228 lb 3.2 oz (103.5 kg)  10/07/19 230 lb 3.2 oz (104.4 kg)    Lab Results  Component Value Date   WBC 4.4 03/20/2019   HGB 15.6 03/20/2019   HCT 45.7 03/20/2019   PLT 166.0 03/20/2019   GLUCOSE 90 03/20/2019   CHOL 168 03/20/2019   TRIG 102.0 03/20/2019   HDL 57.80 03/20/2019   LDLCALC 90 03/20/2019   ALT 21 03/20/2019   AST 21 03/20/2019   NA 137 03/20/2019   K 4.9 03/20/2019   CL 100 03/20/2019   CREATININE 0.92 03/20/2019   BUN 12 03/20/2019   CO2 32 03/20/2019   TSH 1.96 07/30/2014   PSA 0.85 03/20/2019   INR  1.07 09/06/2011   HGBA1C 5.1 08/14/2017    Assessment/Plan:  Sprain of calcaneofibular ligament of right ankle, initial encounter  -The supportive care including elevation, ice, compression, rest, NSAIDs as needed -Given tenderness of lateral malleolus will obtain imaging -Ankle wrapped with Ace bandage by this provider. -Further recommendations if needed based on imaging results -Handouts given and ankle exercises. -Given precautions - Plan: DG Ankle Complete Right, naproxen (NAPROSYN) 500 MG tablet  Edema of right ankle -Supportive care  - Plan: naproxen  (NAPROSYN) 500 MG tablet  F/u as needed  Update: Avulsion fracture of inferior tip of fibula on x-ray.  Pt informed via mychart.  Advised to obtain a supportive ankle brace over the weekend.  For continued pain follow-up on Monday in clinic for Airc[facility].   Grier Mitts, MD

## 2020-03-15 ENCOUNTER — Encounter: Payer: Self-pay | Admitting: Family Medicine

## 2020-04-08 ENCOUNTER — Other Ambulatory Visit: Payer: Self-pay | Admitting: Family Medicine

## 2020-04-16 ENCOUNTER — Ambulatory Visit: Payer: Medicare Other

## 2020-04-27 ENCOUNTER — Ambulatory Visit (INDEPENDENT_AMBULATORY_CARE_PROVIDER_SITE_OTHER): Payer: Medicare Other | Admitting: Family Medicine

## 2020-04-27 ENCOUNTER — Other Ambulatory Visit: Payer: Self-pay

## 2020-04-27 ENCOUNTER — Encounter: Payer: Self-pay | Admitting: Family Medicine

## 2020-04-27 VITALS — BP 112/72 | HR 76 | Temp 98.5°F | Ht 79.25 in | Wt 236.9 lb

## 2020-04-27 DIAGNOSIS — Z0000 Encounter for general adult medical examination without abnormal findings: Secondary | ICD-10-CM | POA: Diagnosis not present

## 2020-04-27 DIAGNOSIS — M25571 Pain in right ankle and joints of right foot: Secondary | ICD-10-CM | POA: Diagnosis not present

## 2020-04-27 DIAGNOSIS — F411 Generalized anxiety disorder: Secondary | ICD-10-CM

## 2020-04-27 DIAGNOSIS — Z131 Encounter for screening for diabetes mellitus: Secondary | ICD-10-CM

## 2020-04-27 DIAGNOSIS — Z13220 Encounter for screening for lipoid disorders: Secondary | ICD-10-CM | POA: Diagnosis not present

## 2020-04-27 DIAGNOSIS — J309 Allergic rhinitis, unspecified: Secondary | ICD-10-CM

## 2020-04-27 LAB — CBC WITH DIFFERENTIAL/PLATELET
Basophils Absolute: 0.1 10*3/uL (ref 0.0–0.1)
Basophils Relative: 0.9 % (ref 0.0–3.0)
Eosinophils Absolute: 0.3 10*3/uL (ref 0.0–0.7)
Eosinophils Relative: 4.6 % (ref 0.0–5.0)
HCT: 43.9 % (ref 39.0–52.0)
Hemoglobin: 15.1 g/dL (ref 13.0–17.0)
Lymphocytes Relative: 21.5 % (ref 12.0–46.0)
Lymphs Abs: 1.2 10*3/uL (ref 0.7–4.0)
MCHC: 34.4 g/dL (ref 30.0–36.0)
MCV: 89.3 fl (ref 78.0–100.0)
Monocytes Absolute: 0.5 10*3/uL (ref 0.1–1.0)
Monocytes Relative: 8.8 % (ref 3.0–12.0)
Neutro Abs: 3.6 10*3/uL (ref 1.4–7.7)
Neutrophils Relative %: 64.2 % (ref 43.0–77.0)
Platelets: 143 10*3/uL — ABNORMAL LOW (ref 150.0–400.0)
RBC: 4.91 Mil/uL (ref 4.22–5.81)
RDW: 13.3 % (ref 11.5–15.5)
WBC: 5.7 10*3/uL (ref 4.0–10.5)

## 2020-04-27 LAB — LIPID PANEL
Cholesterol: 167 mg/dL (ref 0–200)
HDL: 54.2 mg/dL (ref >39.00)
LDL Cholesterol: 94 mg/dL (ref 0–99)
NonHDL: 112.68
Total CHOL/HDL Ratio: 3
Triglycerides: 92 mg/dL (ref 0.0–149.0)
VLDL: 18.4 mg/dL (ref 0.0–40.0)

## 2020-04-27 LAB — COMPREHENSIVE METABOLIC PANEL
ALT: 18 U/L (ref 0–53)
AST: 19 U/L (ref 0–37)
Albumin: 4.6 g/dL (ref 3.5–5.2)
Alkaline Phosphatase: 58 U/L (ref 39–117)
BUN: 13 mg/dL (ref 6–23)
CO2: 31 mEq/L (ref 19–32)
Calcium: 9.4 mg/dL (ref 8.4–10.5)
Chloride: 100 mEq/L (ref 96–112)
Creatinine, Ser: 0.95 mg/dL (ref 0.40–1.50)
GFR: 83.23 mL/min (ref >60.00)
Glucose, Bld: 80 mg/dL (ref 70–99)
Potassium: 4.3 mEq/L (ref 3.5–5.1)
Sodium: 138 mEq/L (ref 135–145)
Total Bilirubin: 0.7 mg/dL (ref 0.2–1.2)
Total Protein: 6.7 g/dL (ref 6.0–8.3)

## 2020-04-27 LAB — PSA: PSA: 1.01 ng/mL (ref 0.10–4.00)

## 2020-04-27 MED ORDER — SHINGRIX 50 MCG/0.5ML IM SUSR: 0.5000 mL | Freq: Once | INTRAMUSCULAR | 0 refills | Status: AC

## 2020-04-27 MED ORDER — CICLOPIROX 8 % EX SOLN: Freq: Every day | CUTANEOUS | 5 refills | Status: DC

## 2020-04-27 NOTE — Progress Notes (Signed)
Jeffrey [name] DOB: [birth year] Encounter date: 04/27/2020  This is a 65 y.o. male who presents for complete physical   History of present illness/Additional concerns: Rolled right ankle in mid November. It is doing well, but still a little swollen. Improved, but still stays a little swollen. Was walking in small hole in yard and just twisted ankle; inversion. Not sure if it bruised. Had to go up 3 flights of stairs on vacation next week and seemed to heal with this.   On bridge of nose - very tender. Not been like this before. No injury that he knows of. Had some congestion yesterday; but otherwise no issues.   Mood has been good.   Allergies have been good with the allegra.   Past Medical History:  Diagnosis Date  . Abnormal Doppler ultrasound of carotid artery 07-06-2012   normal carotid doppler  . Acute appendicitis 03/30/2017  . AF (atrial fibrillation) (HCC)   . Appendicitis, acute 03/30/2017  . Chronic knee pain 07/23/2015  . H/O echocardiogram 06-26-2012   Transthoracic echo    EF 55-60%  . H/O prior ablation treatment 2011  . History of atrial flutter   . History of cardiac monitoring 06-15-2012   sinus   . Hx of bee sting allergy 07/23/2015  . Normal cardiac stress test 05-24-2007   low risk scan   Past Surgical History:  Procedure Laterality Date  . ATRIAL FIBRILLATION ABLATION    . LAPAROSCOPIC APPENDECTOMY N/A 03/30/2017   Procedure: APPENDECTOMY LAPAROSCOPIC;  Surgeon: Darnell Level, MD;  Location: [facility];  Service: General;  Laterality: N/A;  . left testicle removed (no cancer, benign mass)    . othroscopic knee surgery bil    . vericose veins stripped both legs     Allergies  Allergen Reactions  . Bee Venom Anaphylaxis  . Iodine Solution [Povidone Iodine] Itching    Patient states his arm was irritated following a previous arm surgery where he had been scrubbed with betadine.   Current Meds  Medication Sig  . ciclopirox (PENLAC) 8 % solution Apply topically at  bedtime. Apply over nail and surrounding skin. Apply daily over previous coat. After seven (7) days, may remove with alcohol and continue cycle.  . citalopram (CELEXA) 10 MG tablet TAKE 1 TABLET BY MOUTH  DAILY  . EPINEPHrine 0.3 mg/0.3 mL IJ SOAJ injection Inject 0.3 mLs (0.3 mg total) into the muscle as needed for anaphylaxis.  . fexofenadine (ALLEGRA) 180 MG tablet Take 180 mg by mouth daily.  . fish oil-omega-3 fatty acids 1000 MG capsule Take 1 g by mouth daily.  . Glucosamine-Chondroitin (GLUCOSAMINE CHONDR COMPLEX PO) Take 1 tablet by mouth 2 (two) times daily.  . magnesium 30 MG tablet Take 30 mg by mouth daily.  . Multiple Vitamin (MULITIVITAMIN WITH MINERALS) TABS Take 1 tablet by mouth daily.  . [EXPIRED] Zoster Vaccine Adjuvanted [facility]) injection Inject 0.5 mLs into the muscle once for 1 dose. Repeat in 2-6 months   Social History   Tobacco Use  . Smoking status: Former Smoker    Packs/day: 1.50    Years: 8.00    Pack years: 12.00    Types: Cigarettes    Quit date: 07/11/1977    Years since quitting: 42.8  . Smokeless tobacco: Never Used  Substance Use Topics  . Alcohol use: Yes    Alcohol/week: 21.0 standard drinks    Types: 21 Standard drinks or equivalent per week   Family History  Problem Relation Age of Onset  .  Alcohol abuse Mother   . Healthy Mother   . Alcohol abuse Father   . COPD Father        IT trainer, smoker  . Arthritis Maternal Grandmother   . Cancer Maternal Grandmother        ?abdominal  . Breast cancer Sister   . Alzheimer's disease Maternal Grandfather   . Heart attack Paternal Grandfather      Review of Systems  Constitutional: Negative for activity change, appetite change, chills, fatigue, fever and unexpected weight change.  HENT: Negative for congestion, ear pain, hearing loss, sinus pressure, sinus pain, sore throat and trouble swallowing.   Eyes: Negative for pain and visual disturbance.  Respiratory: Negative for cough, chest  tightness, shortness of breath and wheezing.   Cardiovascular: Negative for chest pain, palpitations and leg swelling.  Gastrointestinal: Negative for abdominal distention, abdominal pain, blood in stool, constipation, diarrhea, nausea and vomiting.  Genitourinary: Negative for decreased urine volume, difficulty urinating, dysuria, penile pain and testicular pain.  Musculoskeletal: Negative for arthralgias, back pain and joint swelling.  Skin: Negative for rash.  Neurological: Negative for dizziness, weakness, numbness and headaches.  Hematological: Negative for adenopathy. Does not bruise/bleed easily.  Psychiatric/Behavioral: Negative for agitation, sleep disturbance and suicidal ideas. The patient is not nervous/anxious.     CBC:  Lab Results  Component Value Date   WBC 5.7 04/27/2020   HGB 15.1 04/27/2020   HCT 43.9 04/27/2020   MCH 31.3 09/06/2011   MCHC 34.4 04/27/2020   RDW 13.3 04/27/2020   PLT 143.0 (L) 04/27/2020   CMP: Lab Results  Component Value Date   NA 138 04/27/2020   K 4.3 04/27/2020   CL 100 04/27/2020   CO2 31 04/27/2020   GLUCOSE 80 04/27/2020   BUN 13 04/27/2020   CREATININE 0.95 04/27/2020   GFRAA >90 09/06/2011   CALCIUM 9.4 04/27/2020   PROT 6.7 04/27/2020   BILITOT 0.7 04/27/2020   ALKPHOS 58 04/27/2020   ALT 18 04/27/2020   AST 19 04/27/2020   LIPID: Lab Results  Component Value Date   CHOL 167 04/27/2020   TRIG 92.0 04/27/2020   HDL 54.20 04/27/2020   LDLCALC 94 04/27/2020    Objective:  BP 112/72 (BP Location: Left Arm, Patient Position: Sitting, Cuff Size: Large)   Pulse 76   Temp 98.5 F (36.9 C) (Oral)   Ht 6' 7.25" (2.013 m)   Wt 236 lb 14.4 oz (107.5 kg)   BMI 26.52 kg/m   Weight: 236 lb 14.4 oz (107.5 kg)   BP Readings from Last 3 Encounters:  04/27/20 112/72  03/06/20 110/68  02/13/20 118/74   Wt Readings from Last 3 Encounters:  04/27/20 236 lb 14.4 oz (107.5 kg)  03/06/20 229 lb 3.2 oz (104 kg)  02/13/20 228 lb  3.2 oz (103.5 kg)    Physical Exam Constitutional:      General: He is not in acute distress.    Appearance: He is well-developed and well-nourished.  HENT:     Head: Normocephalic and atraumatic.     Comments: Mild edema appreciated bridge of nose with slight tenderness.  No erythema or skin abnormalities noted.    Right Ear: External ear normal.     Left Ear: External ear normal.     Nose: Nose normal.     Mouth/Throat:     Mouth: Oropharynx is clear and moist.     Pharynx: No oropharyngeal exudate.  Eyes:     Conjunctiva/sclera: Conjunctivae normal.  Pupils: Pupils are equal, round, and reactive to light.  Neck:     Thyroid: No thyromegaly.  Cardiovascular:     Rate and Rhythm: Normal rate and regular rhythm.     Pulses: Intact distal pulses.     Heart sounds: Normal heart sounds. No murmur heard. No friction rub. No gallop.   Pulmonary:     Effort: Pulmonary effort is normal. No respiratory distress.     Breath sounds: Normal breath sounds. No stridor. No wheezing or rales.  Abdominal:     General: Bowel sounds are normal.     Palpations: Abdomen is soft.  Genitourinary:    Penis: Normal.      Testes: Normal.  Musculoskeletal:        General: Normal range of motion.     Cervical back: Neck supple.     Comments: No reproducible tenderness with palpation of right ankle.  There is some slight edema, most notable over the ankle mortise.  No metatarsal tenderness or medial or lateral malleoli or tenderness.  Skin:    General: Skin is warm and dry.     Comments: Thickened yellow toenails great, middle, fifth bilateral toes.  Neurological:     Mental Status: He is alert and oriented to person, place, and time.  Psychiatric:        Mood and Affect: Mood and affect normal.        Behavior: Behavior normal.        Thought Content: Thought content normal.        Judgment: Judgment normal.     Assessment/Plan: Health Maintenance Due  Topic Date Due  . PNA vac Low  Risk Adult (2 of 2 - PCV13) 01/24/2020   Health Maintenance reviewed - up to date..  1. Preventative health care Keep up with regular exercise, healthy eating. - PSA; Future - PSA  2. Lipid screening - Lipid panel; Future - Lipid panel  3. Screening for diabetes mellitus - Comprehensive metabolic panel; Future - Comprehensive metabolic panel  4. Acute right ankle pain Let me know if any worsening.  At this point we discussed that since he has had significant improvement, I suspect healing to be normal. - CBC with Differential/Platelet; Future - CBC with Differential/Platelet  5.  Anxiety: Has been stable with Celexa 10 mg daily  6. seasonal allergies well controlled with Allegra  7. onychomycosis: Trial Penlac topical.  Could consider oral antifungals if not noting improvement with this.   Return in about 1 year (around 04/27/2021) for physical exam.  Micheline Rough, MD

## 2020-05-01 DIAGNOSIS — H2513 Age-related nuclear cataract, bilateral: Secondary | ICD-10-CM | POA: Diagnosis not present

## 2020-06-30 ENCOUNTER — Other Ambulatory Visit: Payer: Self-pay | Admitting: Family Medicine

## 2020-07-23 ENCOUNTER — Telehealth: Payer: Self-pay | Admitting: Family Medicine

## 2020-07-23 NOTE — Telephone Encounter (Signed)
Spoke to patient to schedule Medicare Annual Wellness Visit (AWV) either virtually or in office. He will call back to schedule    awvi 06/24/19 per [facility]   please schedule at anytime with [facility] Nurse Health Advisor 1 or 2   This should be a 45 minute visit.

## 2020-09-22 ENCOUNTER — Telehealth: Payer: Self-pay | Admitting: Family Medicine

## 2020-09-22 NOTE — Telephone Encounter (Signed)
Left message for patient to call back and schedule Medicare Annual Wellness Visit (AWV) either virtually or in office.    awvi 06/24/19 per [facility]   please schedule at anytime with [facility] Nurse Health Advisor 1 or 2   This should be a 45 minute visit.

## 2020-09-23 DIAGNOSIS — M1711 Unilateral primary osteoarthritis, right knee: Secondary | ICD-10-CM | POA: Diagnosis not present

## 2020-09-30 ENCOUNTER — Ambulatory Visit: Payer: Medicare Other

## 2020-11-02 ENCOUNTER — Other Ambulatory Visit: Payer: Self-pay

## 2020-11-02 ENCOUNTER — Encounter: Payer: Self-pay | Admitting: Family Medicine

## 2020-11-02 ENCOUNTER — Ambulatory Visit (INDEPENDENT_AMBULATORY_CARE_PROVIDER_SITE_OTHER): Payer: Medicare Other

## 2020-11-02 ENCOUNTER — Telehealth (INDEPENDENT_AMBULATORY_CARE_PROVIDER_SITE_OTHER): Payer: Medicare Other | Admitting: Family Medicine

## 2020-11-02 ENCOUNTER — Ambulatory Visit (INDEPENDENT_AMBULATORY_CARE_PROVIDER_SITE_OTHER)
Admission: RE | Admit: 2020-11-02 | Discharge: 2020-11-02 | Disposition: A | Discharge status: Home / Self Care | Payer: Medicare Other | Source: Ambulatory Visit | Attending: Family Medicine | Admitting: Family Medicine

## 2020-11-02 DIAGNOSIS — R059 Cough, unspecified: Secondary | ICD-10-CM

## 2020-11-02 DIAGNOSIS — Z0000 Encounter for general adult medical examination without abnormal findings: Secondary | ICD-10-CM | POA: Diagnosis not present

## 2020-11-02 DIAGNOSIS — R079 Chest pain, unspecified: Secondary | ICD-10-CM | POA: Diagnosis not present

## 2020-11-02 NOTE — Patient Instructions (Signed)
Jeffrey [name] , Thank you for taking time to come for your Medicare Wellness Visit. I appreciate your ongoing commitment to your health goals. Please review the following plan we discussed and let me know if I can assist you in the future.   Screening recommendations/referrals: Colonoscopy: 09/02/2013  due 2025 Recommended yearly ophthalmology/optometry visit for glaucoma screening and checkup Recommended yearly dental visit for hygiene and checkup  Vaccinations: Influenza vaccine: due in fall 2022 Pneumococcal vaccine: completed series  Tdap vaccine: 04/25/2017 Shingles vaccine: will obtain local pharmacy     Advanced directives: will provide copies   Conditions/risks identified: none   Next appointment: none   Preventive Care 31 Years and Older, Male Preventive care refers to lifestyle choices and visits with your health care provider that can promote health and wellness. What does preventive care include? A yearly physical exam. This is also called an annual well check. Dental exams once or twice a year. Routine eye exams. Ask your health care provider how often you should have your eyes checked. Personal lifestyle choices, including: Daily care of your teeth and gums. Regular physical activity. Eating a healthy diet. Avoiding tobacco and drug use. Limiting alcohol use. Practicing safe sex. Taking low doses of aspirin every day. Taking vitamin and mineral supplements as recommended by your health care provider. What happens during an annual well check? The services and screenings done by your health care provider during your annual well check will depend on your age, overall health, lifestyle risk factors, and family history of disease. Counseling  Your health care provider may ask you questions about your: Alcohol use. Tobacco use. Drug use. Emotional well-being. Home and relationship well-being. Sexual activity. Eating habits. History of falls. Memory and ability to  understand (cognition). Work and work Statistician. Screening  You may have the following tests or measurements: Height, weight, and BMI. Blood pressure. Lipid and cholesterol levels. These may be checked every 5 years, or more frequently if you are over 12 years old. Skin check. Lung cancer screening. You may have this screening every year starting at age 52 if you have a 30-pack-year history of smoking and currently smoke or have quit within the past 15 years. Fecal occult blood test (FOBT) of the stool. You may have this test every year starting at age 36. Flexible sigmoidoscopy or colonoscopy. You may have a sigmoidoscopy every 5 years or a colonoscopy every 10 years starting at age 65. Prostate cancer screening. Recommendations will vary depending on your family history and other risks. Hepatitis C blood test. Hepatitis B blood test. Sexually transmitted disease (STD) testing. Diabetes screening. This is done by checking your blood sugar (glucose) after you have not eaten for a while (fasting). You may have this done every 1-3 years. Abdominal aortic aneurysm (AAA) screening. You may need this if you are a current or former smoker. Osteoporosis. You may be screened starting at age 32 if you are at high risk. Talk with your health care provider about your test results, treatment options, and if necessary, the need for more tests. Vaccines  Your health care provider may recommend certain vaccines, such as: Influenza vaccine. This is recommended every year. Tetanus, diphtheria, and acellular pertussis (Tdap, Td) vaccine. You may need a Td booster every 10 years. Zoster vaccine. You may need this after age 59. Pneumococcal 13-valent conjugate (PCV13) vaccine. One dose is recommended after age 40. Pneumococcal polysaccharide (PPSV23) vaccine. One dose is recommended after age 86. Talk to your health care provider about  which screenings and vaccines you need and how often you need them. This  information is not intended to replace advice given to you by your health care provider. Make sure you discuss any questions you have with your health care provider. Document Released: 05/08/2015 Document Revised: 12/30/2015 Document Reviewed: 02/10/2015 Elsevier Interactive Patient Education  2017 [employer] Prevention in the Home Falls can cause injuries. They can happen to people of all ages. There are many things you can do to make your home safe and to help prevent falls. What can I do on the outside of my home? Regularly fix the edges of walkways and driveways and fix any cracks. Remove anything that might make you trip as you walk through a door, such as a raised step or threshold. Trim any bushes or trees on the path to your home. Use bright outdoor lighting. Clear any walking paths of anything that might make someone trip, such as rocks or tools. Regularly check to see if handrails are loose or broken. Make sure that both sides of any steps have handrails. Any raised decks and porches should have guardrails on the edges. Have any leaves, snow, or ice cleared regularly. Use sand or salt on walking paths during winter. Clean up any spills in your garage right away. This includes oil or grease spills. What can I do in the bathroom? Use night lights. Install grab bars by the toilet and in the tub and shower. Do not use towel bars as grab bars. Use non-skid mats or decals in the tub or shower. If you need to sit down in the shower, use a plastic, non-slip stool. Keep the floor dry. Clean up any water that spills on the floor as soon as it happens. Remove soap buildup in the tub or shower regularly. Attach bath mats securely with double-sided non-slip rug tape. Do not have throw rugs and other things on the floor that can make you trip. What can I do in the bedroom? Use night lights. Make sure that you have a light by your bed that is easy to reach. Do not use any sheets or  blankets that are too big for your bed. They should not hang down onto the floor. Have a firm chair that has side arms. You can use this for support while you get dressed. Do not have throw rugs and other things on the floor that can make you trip. What can I do in the kitchen? Clean up any spills right away. Avoid walking on wet floors. Keep items that you use a lot in easy-to-reach places. If you need to reach something above you, use a strong step stool that has a grab bar. Keep electrical cords out of the way. Do not use floor polish or wax that makes floors slippery. If you must use wax, use non-skid floor wax. Do not have throw rugs and other things on the floor that can make you trip. What can I do with my stairs? Do not leave any items on the stairs. Make sure that there are handrails on both sides of the stairs and use them. Fix handrails that are broken or loose. Make sure that handrails are as long as the stairways. Check any carpeting to make sure that it is firmly attached to the stairs. Fix any carpet that is loose or worn. Avoid having throw rugs at the top or bottom of the stairs. If you do have throw rugs, attach them to the floor with  carpet tape. Make sure that you have a light switch at the top of the stairs and the bottom of the stairs. If you do not have them, ask someone to add them for you. What else can I do to help prevent falls? Wear shoes that: Do not have high heels. Have rubber bottoms. Are comfortable and fit you well. Are closed at the toe. Do not wear sandals. If you use a stepladder: Make sure that it is fully opened. Do not climb a closed stepladder. Make sure that both sides of the stepladder are locked into place. Ask someone to hold it for you, if possible. Clearly mark and make sure that you can see: Any grab bars or handrails. First and last steps. Where the edge of each step is. Use tools that help you move around (mobility aids) if they are  needed. These include: Canes. Walkers. Scooters. Crutches. Turn on the lights when you go into a dark area. Replace any light bulbs as soon as they burn out. Set up your furniture so you have a clear path. Avoid moving your furniture around. If any of your floors are uneven, fix them. If there are any pets around you, be aware of where they are. Review your medicines with your doctor. Some medicines can make you feel dizzy. This can increase your chance of falling. Ask your doctor what other things that you can do to help prevent falls. This information is not intended to replace advice given to you by your health care provider. Make sure you discuss any questions you have with your health care provider. Document Released: 02/05/2009 Document Revised: 09/17/2015 Document Reviewed: 05/16/2014 Elsevier Interactive Patient Education  2017 [employer].

## 2020-11-02 NOTE — Progress Notes (Signed)
Subjective:   Jeffrey [name] is a 65 y.o. male who presents for an Initial Medicare Annual Wellness Visit.   I connected with Elder Negus today by telephone and verified that I am speaking with the correct person using two identifiers. Location patient: home Location provider: work Persons participating in the virtual visit: patient, provider.   I discussed the limitations, risks, security and privacy concerns of performing an evaluation and management service by telephone and the availability of in person appointments. I also discussed with the patient that there may be a patient responsible charge related to this service. The patient expressed understanding and verbally consented to this telephonic visit.    Interactive audio and video telecommunications were attempted between this provider and patient, however failed, due to patient having technical difficulties OR patient did not have access to video capability.  We continued and completed visit with audio only.    Review of Systems    N/a       Objective:    There were no vitals filed for this visit. There is no height or weight on file to calculate BMI.  Advanced Directives 03/30/2017 03/30/2017 03/30/2017  Does Patient Have a Medical Advance Directive? No No Yes  Type of Paramedic of [place];Living will - [place];Living will  Does patient want to make changes to medical advance directive? No - Patient declined - No - Patient declined  Copy of [place] in Chart? No - copy requested - No - copy requested  Would patient like information on creating a medical advance directive? No - Patient declined - -    Current Medications (verified) Outpatient Encounter Medications as of 11/02/2020  Medication Sig   ciclopirox (PENLAC) 8 % solution Apply topically at bedtime. Apply over nail and surrounding skin. Apply daily over previous coat. After seven (7) days, may  remove with alcohol and continue cycle. (Patient not taking: Reported on 11/02/2020)   citalopram (CELEXA) 10 MG tablet TAKE 1 TABLET BY MOUTH  DAILY   EPINEPHrine 0.3 mg/0.3 mL IJ SOAJ injection Inject 0.3 mLs (0.3 mg total) into the muscle as needed for anaphylaxis.   fexofenadine (ALLEGRA) 180 MG tablet Take 180 mg by mouth daily.   fish oil-omega-3 fatty acids 1000 MG capsule Take 1 g by mouth daily.   Glucosamine-Chondroitin (GLUCOSAMINE CHONDR COMPLEX PO) Take 1 tablet by mouth 2 (two) times daily.   magnesium 30 MG tablet Take 30 mg by mouth daily.   Multiple Vitamin (MULITIVITAMIN WITH MINERALS) TABS Take 1 tablet by mouth daily.   No facility-administered encounter medications on file as of 11/02/2020.    Allergies (verified) Bee venom and Iodine solution [povidone iodine]   History: Past Medical History:  Diagnosis Date   Abnormal Doppler ultrasound of carotid artery 07-06-2012   normal carotid doppler   Acute appendicitis 03/30/2017   AF (atrial fibrillation) (HCC)    Appendicitis, acute 03/30/2017   Chronic knee pain 07/23/2015   H/O echocardiogram 06-26-2012   Transthoracic echo    EF 55-60%   H/O prior ablation treatment 2011   History of atrial flutter    History of cardiac monitoring 06-15-2012   sinus    Hx of bee sting allergy 07/23/2015   Normal cardiac stress test 05-24-2007   low risk scan   Past Surgical History:  Procedure Laterality Date   ATRIAL FIBRILLATION ABLATION     LAPAROSCOPIC APPENDECTOMY N/A 03/30/2017   Procedure: APPENDECTOMY LAPAROSCOPIC;  Surgeon:  Armandina Gemma, MD;  Location: [facility];  Service: General;  Laterality: N/A;   left testicle removed (no cancer, benign mass)     othroscopic knee surgery bil     vericose veins stripped both legs     Family History  Problem Relation Age of Onset   Alcohol abuse Mother    Healthy Mother    Alcohol abuse Father    COPD Father        IT trainer, smoker   Arthritis Maternal Grandmother    Cancer  Maternal Grandmother        ?abdominal   Breast cancer Sister    Alzheimer's disease Maternal Grandfather    Heart attack Paternal Grandfather    Social History   Socioeconomic History   Marital status: Married    Spouse name: Not on file   Number of children: Not on file   Years of education: Not on file   Highest education level: Not on file  Occupational History   Occupation: Freight forwarder  Tobacco Use   Smoking status: Former    Packs/day: 1.50    Years: 8.00    Pack years: 12.00    Types: Cigarettes    Quit date: 07/11/1977    Years since quitting: 43.3   Smokeless tobacco: Never  Substance and Sexual Activity   Alcohol use: Yes    Alcohol/week: 21.0 standard drinks    Types: 21 Standard drinks or equivalent per week   Drug use: No   Sexual activity: Not on file  Other Topics Concern   Not on file  Social History Narrative   Work or School: [employer], [place] Situation: lives with wife      Spiritual Beliefs: none      Lifestyle: gym 3 days per week; diet is healthy      Social Determinants of Radio broadcast assistant Strain: Not on file  Food Insecurity: Not on file  Transportation Needs: Not on file  Physical Activity: Not on file  Stress: Not on file  Social Connections: Not on file    Tobacco Counseling Counseling given: Not Answered   Clinical Intake:                 Diabetic?no         Activities of Daily Living No flowsheet data found.  Patient Care Team: Caren Macadam, MD as PCP - General (Family Medicine)  Indicate any recent Medical Services you may have received from other than [facility] providers in the past year (date may be approximate).     Assessment:   This is a routine wellness examination for Jeffrey [name].  Hearing/Vision screen No results found.  Dietary issues and exercise activities discussed:     Goals Addressed   None    Depression Screen PHQ 2/9 Scores 04/27/2020 03/20/2019 08/11/2016   PHQ - 2 Score 0 0 0    Fall Risk No flowsheet data found.  FALL RISK PREVENTION PERTAINING TO THE HOME:  Any stairs in or around the home? Yes  If so, are there any without handrails? No  Home free of loose throw rugs in walkways, pet beds, electrical cords, etc? Yes  Adequate lighting in your home to reduce risk of falls? Yes   ASSISTIVE DEVICES UTILIZED TO PREVENT FALLS:  Life alert? Yes  Use of a cane, walker or w/c? No  Grab bars in the bathroom? No  Shower chair or bench in shower? Yes  Elevated toilet seat or a handicapped toilet? No    Cognitive Function:  Normal cognitive status assessed by direct observation by this Nurse Health Advisor. No abnormalities found.        Immunizations Immunization History  Administered Date(s) Administered   Fluad Quad(high Dose 65+) 01/24/2019   Influenza Split 02/16/2011, 01/20/2012   Influenza Whole 02/04/2010   Influenza, High Dose Seasonal PF 01/17/2020   Influenza,inj,Quad PF,6+ Mos 01/17/2014, 02/19/2015, 01/18/2016, 01/17/2017, 01/15/2018   PFIZER(Purple Top)SARS-COV-2 Vaccination 05/09/2019, 05/27/2019, 02/07/2020   Pneumococcal Polysaccharide-23 01/24/2019   Td 04/26/2007   Tdap 04/25/2017   Zoster, Live 08/04/2014    TDAP status: Up to date  Flu Vaccine status: Up to date  Pneumococcal vaccine status: Up to date  Covid-19 vaccine status: Completed vaccines  Qualifies for Shingles Vaccine? Yes   Zostavax completed No   Shingrix Completed?: No.    Education has been provided regarding the importance of this vaccine. Patient has been advised to call insurance company to determine out of pocket expense if they have not yet received this vaccine. Advised may also receive vaccine at local pharmacy or Health Dept. Verbalized acceptance and understanding.  Screening Tests Health Maintenance  Topic Date Due   Zoster Vaccines- Shingrix (1 of 2) Never done   PNA vac Low Risk Adult (2 of 2 - PCV13) 01/24/2020    COVID-19 Vaccine (4 - Booster for Pfizer series) 05/09/2020   INFLUENZA VACCINE  11/23/2020   COLONOSCOPY (Pts 45-104yrs Insurance coverage will need to be confirmed)  09/03/2023   TETANUS/TDAP  04/26/2027   Hepatitis C Screening  Addressed   HPV VACCINES  Aged Out    Health Maintenance  Health Maintenance Due  Topic Date Due   Zoster Vaccines- Shingrix (1 of 2) Never done   PNA vac Low Risk Adult (2 of 2 - PCV13) 01/24/2020   COVID-19 Vaccine (4 - Booster for Pfizer series) 05/09/2020    Colorectal cancer screening: Type of screening: Colonoscopy. Completed 09/02/2013. Repeat every 10 years  Lung Cancer Screening: (Low Dose CT Chest recommended if Age 48-80 years, 30 pack-year currently smoking OR have quit w/in 15years.) does not qualify.   Lung Cancer Screening Referral: n/a  Additional Screening:  Hepatitis C Screening: does not qualify; Completed 08/11/2015  Vision Screening: Recommended annual ophthalmology exams for early detection of glaucoma and other disorders of the eye. Is the patient up to date with their annual eye exam?  Yes  Who is the provider or what is the name of the office in which the patient attends annual eye exams? Dr.Scott If pt is not established with a provider, would they like to be referred to a provider to establish care? No .   Dental Screening: Recommended annual dental exams for proper oral hygiene  Community Resource Referral / Chronic Care Management: CRR required this visit?  No   CCM required this visit?  No      Plan:     I have personally reviewed and noted the following in the patient's chart:   Medical and social history Use of alcohol, tobacco or illicit drugs  Current medications and supplements including opioid prescriptions. Patient is not currently taking opioid prescriptions. Functional ability and status Nutritional status Physical activity Advanced directives List of other physicians Hospitalizations, surgeries,  and ER visits in previous 12 months Vitals Screenings to include cognitive, depression, and falls Referrals and appointments  In addition, I have reviewed and discussed with patient certain preventive protocols, quality metrics, and best  practice recommendations. A written personalized care plan for preventive services as well as general preventive health recommendations were provided to patient.     Randel Pigg, LPN   [id number]   Nurse Notes: none

## 2020-11-02 NOTE — Progress Notes (Signed)
Virtual Visit via Video Note  I connected with Jeffrey [name] on 11/02/20 at 11:30 AM EDT by a video enabled telemedicine application 2/2 [id number] pandemic and verified that I am speaking with the correct person using two identifiers.  Location patient: home Location provider:work or home office Persons participating in the virtual visit: patient, provider  I discussed the limitations of evaluation and management by telemedicine and the availability of in person appointments. The patient expressed understanding and agreed to proceed.   HPI: Pt with watery eyes, rhionrrhea, nasal congestion, productive cough that started on Wed.  Pt tried seudafed, nyquil, and dayquil.  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  Denies fever, diarrhea, n/v.   Slight HA today.  Pt had a negative home COVID test on Sat.  States cough worse at night  with laying on R side.   ROS: See pertinent positives and negatives per HPI.  Past Medical History:  Diagnosis Date   Abnormal Doppler ultrasound of carotid artery 07-06-2012   normal carotid doppler   Acute appendicitis 03/30/2017   AF (atrial fibrillation) (HCC)    Appendicitis, acute 03/30/2017   Chronic knee pain 07/23/2015   H/O echocardiogram 06-26-2012   Transthoracic echo    EF 55-60%   H/O prior ablation treatment 2011   History of atrial flutter    History of cardiac monitoring 06-15-2012   sinus    Hx of bee sting allergy 07/23/2015   Normal cardiac stress test 05-24-2007   low risk scan    Past Surgical History:  Procedure Laterality Date   ATRIAL FIBRILLATION ABLATION     LAPAROSCOPIC APPENDECTOMY N/A 03/30/2017   Procedure: APPENDECTOMY LAPAROSCOPIC;  Surgeon: Armandina Gemma, MD;  Location: [facility];  Service: General;  Laterality: N/A;   left testicle removed (no cancer, benign mass)     othroscopic knee surgery bil     vericose veins stripped both legs      Family History  Problem Relation Age of Onset   Alcohol abuse Mother    Healthy  Mother    Alcohol abuse Father    COPD Father        IT trainer, smoker   Arthritis Maternal Grandmother    Cancer Maternal Grandmother        ?abdominal   Breast cancer Sister    Alzheimer's disease Maternal Grandfather    Heart attack Paternal Grandfather      Current Outpatient Medications:    citalopram (CELEXA) 10 MG tablet, TAKE 1 TABLET BY MOUTH  DAILY, Disp: 90 tablet, Rfl: 3   EPINEPHrine 0.3 mg/0.3 mL IJ SOAJ injection, Inject 0.3 mLs (0.3 mg total) into the muscle as needed for anaphylaxis., Disp: 1 each, Rfl: 2   fexofenadine (ALLEGRA) 180 MG tablet, Take 180 mg by mouth daily., Disp: , Rfl:    fish oil-omega-3 fatty acids 1000 MG capsule, Take 1 g by mouth daily., Disp: , Rfl:    Glucosamine-Chondroitin (GLUCOSAMINE CHONDR COMPLEX PO), Take 1 tablet by mouth 2 (two) times daily., Disp: , Rfl:    magnesium 30 MG tablet, Take 30 mg by mouth daily., Disp: , Rfl:    Multiple Vitamin (MULITIVITAMIN WITH MINERALS) TABS, Take 1 tablet by mouth daily., Disp: , Rfl:    ciclopirox (PENLAC) 8 % solution, Apply topically at bedtime. Apply over nail and surrounding skin. Apply daily over previous coat. After seven (7) days, may remove with alcohol and continue cycle. (Patient not taking: Reported on 11/02/2020), Disp: 6.6 mL, Rfl: 5  EXAM:  VITALS per patient if applicable: RR between 78-29 bpm  GENERAL: alert, oriented, appears well and in no acute distress  HEENT: atraumatic, conjunctiva clear, no obvious abnormalities on inspection of external nose and ears  NECK: normal movements of the head and neck  LUNGS: on inspection no signs of respiratory distress, breathing rate appears normal, no obvious gross SOB, gasping or wheezing  CV: no obvious cyanosis  MS: moves all visible extremities without noticeable abnormality  PSYCH/NEURO: pleasant and cooperative, no obvious depression or anxiety, speech and thought processing grossly intact  ASSESSMENT AND PLAN:  Discussed the  following assessment and plan:  Cough  -likely 2/2 viral etiology.  Also consider pna or COPD given remote smoking hx. -supportive care: rest, hydration, OTC cough/cold meds -COVID test negative 3 days ago, consider repeat -will obtain CXR.  Further recs based on imaging. - Plan: DG Chest 2 View  F/u prn   I discussed the assessment and treatment plan with the patient. The patient was provided an opportunity to ask questions and all were answered. The patient agreed with the plan and demonstrated an understanding of the instructions.   The patient was advised to call back or seek an in-person evaluation if the symptoms worsen or if the condition fails to improve as anticipated.  Billie Ruddy, MD

## 2020-11-06 ENCOUNTER — Other Ambulatory Visit: Payer: Self-pay

## 2020-11-06 ENCOUNTER — Ambulatory Visit (INDEPENDENT_AMBULATORY_CARE_PROVIDER_SITE_OTHER): Payer: Medicare Other | Admitting: Family Medicine

## 2020-11-06 ENCOUNTER — Encounter: Payer: Self-pay | Admitting: Family Medicine

## 2020-11-06 VITALS — BP 118/72 | HR 78 | Temp 97.1°F | Ht 79.25 in | Wt 230.5 lb

## 2020-11-06 DIAGNOSIS — R059 Cough, unspecified: Secondary | ICD-10-CM

## 2020-11-06 DIAGNOSIS — R062 Wheezing: Secondary | ICD-10-CM

## 2020-11-06 MED ORDER — ALBUTEROL SULFATE HFA 108 (90 BASE) MCG/ACT IN AERS: 2.0000 | INHALATION_SPRAY | Freq: Four times a day (QID) | RESPIRATORY_TRACT | 0 refills | Status: DC | PRN

## 2020-11-06 MED ORDER — DOXYCYCLINE HYCLATE 100 MG PO TABS: 100.0000 mg | ORAL_TABLET | Freq: Two times a day (BID) | ORAL | 0 refills | Status: DC

## 2020-11-06 MED ORDER — SPACER/AERO-HOLDING CHAMBERS DEVI: 1.0000 | Freq: Every day | 0 refills | Status: DC | PRN

## 2020-11-06 MED ORDER — HYDROCOD POLST-CPM POLST ER 10-8 MG/5ML PO SUER: 5.0000 mL | Freq: Two times a day (BID) | ORAL | 0 refills | Status: DC | PRN

## 2020-11-06 NOTE — Progress Notes (Signed)
Jeffrey [name] DOB: 14-Jan-1956 Encounter date: 11/06/2020  This is a 65 y.o. male who presents with Chief Complaint  Patient presents with   Cough    Patient complains of a productive cough with clear-gray sputum and chest congestion x5 days, visit by Dr Volanda Napoleon earlier this week with normal chest x-ray results, tried Mucinex and Nyquil with no relief, negative home Covid test 6 days ago     History of present illness: Patient saw Dr. Volanda Napoleon on 7/11 for cough.  Normal x-ray noted same day. Hasn't had good nights sleep in 2 weeks. States this is pretty classic for him (hasn't happened in a few years). Gets runny nose, eyes after sore throat; then sinus congestion and lingering cough. Typically resolved with dose of tussionex. Usually dries things out for him in 3-4 days. Had walking pneumonia about 20 years ago; tends to get bronchitis but has been better in recent years; just prolonged cough. Doesn't feel short of breath. Feels like it is just in bronchi; central. Doesn't feel like it is in lungs. Went from clear to grey to mix between white/grey. No blood. No sinus pain or pressure. Did have some mild eye discomfort/tenderness when this started, but gone now. No fevers, chills. Can hear some crackling with breathing. Feels about the same today as he did when he talked with Dr. Volanda Napoleon last time.   Last visit with me is 04/27/2020 for physical.  Allergies  Allergen Reactions   Bee Venom Anaphylaxis   Iodine Solution [Povidone Iodine] Itching    Patient states his arm was irritated following a previous arm surgery where he had been scrubbed with betadine.   Current Meds  Medication Sig   ciclopirox (PENLAC) 8 % solution Apply topically at bedtime. Apply over nail and surrounding skin. Apply daily over previous coat. After seven (7) days, may remove with alcohol and continue cycle.   citalopram (CELEXA) 10 MG tablet TAKE 1 TABLET BY MOUTH  DAILY   EPINEPHrine 0.3 mg/0.3 mL IJ SOAJ injection  Inject 0.3 mLs (0.3 mg total) into the muscle as needed for anaphylaxis.   fexofenadine (ALLEGRA) 180 MG tablet Take 180 mg by mouth daily.   fish oil-omega-3 fatty acids 1000 MG capsule Take 1 g by mouth daily.   Glucosamine-Chondroitin (GLUCOSAMINE CHONDR COMPLEX PO) Take 1 tablet by mouth 2 (two) times daily.   magnesium 30 MG tablet Take 30 mg by mouth daily.   Multiple Vitamin (MULITIVITAMIN WITH MINERALS) TABS Take 1 tablet by mouth daily.    Review of Systems  Constitutional:  Negative for chills and fever.  HENT:  Positive for congestion. Negative for ear pain, postnasal drip, sinus pressure, sinus pain and sore throat.   Respiratory:  Positive for cough. Negative for shortness of breath and wheezing.   Cardiovascular:  Negative for chest pain.   Objective:  BP 118/72 (BP Location: Left Arm, Patient Position: Sitting, Cuff Size: Large)   Pulse 78   Temp (!) 97.1 F (36.2 C) (Axillary)   Ht 6' 7.25" (2.013 m)   Wt 230 lb 8 oz (104.6 kg)   SpO2 95%   BMI 25.80 kg/m   Weight: 230 lb 8 oz (104.6 kg)   BP Readings from Last 3 Encounters:  11/06/20 118/72  04/27/20 112/72  03/06/20 110/68   Wt Readings from Last 3 Encounters:  11/06/20 230 lb 8 oz (104.6 kg)  04/27/20 236 lb 14.4 oz (107.5 kg)  03/06/20 229 lb 3.2 oz (104 kg)  Physical Exam Constitutional:      Appearance: He is well-developed. He is not ill-appearing or toxic-appearing.  HENT:     Right Ear: Tympanic membrane, ear canal and external ear normal.     Left Ear: Tympanic membrane, ear canal and external ear normal.     Nose: Mucosal edema present.     Right Sinus: No maxillary sinus tenderness or frontal sinus tenderness.     Left Sinus: No maxillary sinus tenderness or frontal sinus tenderness.     Mouth/Throat:     Pharynx: Uvula midline.  Cardiovascular:     Rate and Rhythm: Normal rate and regular rhythm.     Heart sounds: Normal heart sounds.  Pulmonary:     Effort: Pulmonary effort is  normal.     Breath sounds: Examination of the right-lower field reveals decreased breath sounds, wheezing and rhonchi. Decreased breath sounds, wheezing and rhonchi present. No rales.    Assessment/Plan 1. Cough Concerned with lack of improvement and congestion on exam that may be getting secondary infection. Cover with doxy; cough syrup, albuterol to help with wheezing. Let us know if not improving. If this was a false negative and patient has COVID, this would not change my management at this time as he is past window for anti-virals and exam warrants current treatment.  - chlorpheniramine-HYDROcodone (TUSSIONEX PENNKINETIC ER) 10-8 MG/5ML SUER; Take 5 mLs by mouth every 12 (twelve) hours as needed for cough.  Dispense: 115 mL; Refill: 0 - doxycycline (VIBRA-TABS) 100 MG tablet; Take 1 tablet (100 mg total) by mouth 2 (two) times daily for 7 days.  Dispense: 14 tablet; Refill: 0  2. Wheezing - albuterol (VENTOLIN HFA) 108 (90 Base) MCG/ACT inhaler; Inhale 2 puffs into the lungs every 6 (six) hours as needed for wheezing or shortness of breath.  Dispense: 8 g; Refill: 0 - Spacer/Aero-Holding Chambers DEVI; 1 Device by Does not apply route daily as needed.  Dispense: 1 each; Refill: 0   Return if symptoms worsen or fail to improve.      Micheline Rough, MD

## 2020-11-06 NOTE — Patient Instructions (Addendum)
*  symptoms should not get worse. Ok to stop doxycycline after 5 days if feeling better overall from cough standpoint.   *take 4 big breaths in and out of the spacing chamber with the inhaler.

## 2020-11-13 ENCOUNTER — Other Ambulatory Visit: Payer: Self-pay | Admitting: Family Medicine

## 2020-11-13 DIAGNOSIS — R059 Cough, unspecified: Secondary | ICD-10-CM

## 2020-11-13 MED ORDER — HYDROCOD POLST-CPM POLST ER 10-8 MG/5ML PO SUER: 5.0000 mL | Freq: Two times a day (BID) | ORAL | 0 refills | Status: DC | PRN

## 2020-11-28 ENCOUNTER — Other Ambulatory Visit: Payer: Self-pay | Admitting: Family Medicine

## 2020-11-28 DIAGNOSIS — R062 Wheezing: Secondary | ICD-10-CM

## 2020-12-09 ENCOUNTER — Telehealth (INDEPENDENT_AMBULATORY_CARE_PROVIDER_SITE_OTHER): Payer: Medicare Other | Admitting: Family Medicine

## 2020-12-09 ENCOUNTER — Encounter: Payer: Self-pay | Admitting: Family Medicine

## 2020-12-09 DIAGNOSIS — M25551 Pain in right hip: Secondary | ICD-10-CM | POA: Diagnosis not present

## 2020-12-09 DIAGNOSIS — H04203 Unspecified epiphora, bilateral lacrimal glands: Secondary | ICD-10-CM | POA: Diagnosis not present

## 2020-12-09 MED ORDER — FLUTICASONE PROPIONATE 50 MCG/ACT NA SUSP: 1.0000 | Freq: Every day | NASAL | 2 refills | Status: DC

## 2020-12-09 NOTE — Progress Notes (Signed)
Virtual Visit via Video Note  I connected with Jeffrey [name] okay to on 12/09/20 at  1:00 PM EDT by a video enabled telemedicine application and verified that I am speaking with the correct person using two identifiers.  Location patient: home Location provider: [place], [place] [address] Persons participating in the virtual visit: patient, provider  I discussed the limitations of evaluation and management by telemedicine and the availability of in person appointments. The patient expressed understanding and agreed to proceed.   Jeffrey [name] DOB: 1955/07/19 Encounter date: 12/09/2020  This is a 65 y.o. male who presents with Chief Complaint  Patient presents with   Eye Pain    Patient complains of bilateral eye pain, pressure, "residue noted in the morning" and watery eyes x2 weeks   Leg Pain    Patient complains of right lateral leg pain and numbness x1 month, using Advil, states he has been treated by an orthopedic doctor for OA of the right knee and right ankle injury occurred 1 year ago, denies recent injury    History of present illness:  After all other cold problems, the eye symtpoms seemed to linger. Taking allegra every morning (for a long time). Not tearing, but watery most of the time. Little pressure towards back of eye.  No longer congested. Just in the morning getting some little "sand".   Rolled ankle back in October pretty badly. Then recently he strained knee and went to [facility] - told arthritis. Now having pain in right butt/hip, knee, ankle. Has complete mobility. Taking advil which relieves this and has full functionality. Taking 2 tablets about 3 times/day. Also using voltaren gel. Has had sciatica back there. Worse in last week.     Allergies  Allergen Reactions   Bee Venom Anaphylaxis   Iodine Solution [Povidone Iodine] Itching    Patient states his arm was irritated following a previous arm surgery where  he had been scrubbed with betadine.   Current Meds  Medication Sig   albuterol (VENTOLIN HFA) 108 (90 Base) MCG/ACT inhaler TAKE 2 PUFFS BY MOUTH EVERY 6 HOURS AS NEEDED FOR WHEEZE OR SHORTNESS OF BREATH   chlorpheniramine-HYDROcodone (TUSSIONEX PENNKINETIC ER) 10-8 MG/5ML SUER Take 5 mLs by mouth every 12 (twelve) hours as needed for cough.   ciclopirox (PENLAC) 8 % solution Apply topically at bedtime. Apply over nail and surrounding skin. Apply daily over previous coat. After seven (7) days, may remove with alcohol and continue cycle.   citalopram (CELEXA) 10 MG tablet TAKE 1 TABLET BY MOUTH  DAILY   EPINEPHrine 0.3 mg/0.3 mL IJ SOAJ injection Inject 0.3 mLs (0.3 mg total) into the muscle as needed for anaphylaxis.   fexofenadine (ALLEGRA) 180 MG tablet Take 180 mg by mouth daily.   fish oil-omega-3 fatty acids 1000 MG capsule Take 1 g by mouth daily.   Glucosamine-Chondroitin (GLUCOSAMINE CHONDR COMPLEX PO) Take 1 tablet by mouth 2 (two) times daily.   magnesium 30 MG tablet Take 30 mg by mouth daily.   Multiple Vitamin (MULITIVITAMIN WITH MINERALS) TABS Take 1 tablet by mouth daily.   Spacer/Aero-Holding Chambers DEVI 1 Device by Does not apply route daily as needed.    Review of Systems  Constitutional:  Negative for chills, fatigue and fever.  HENT:  Negative for congestion.   Eyes:        Watering eyes, see hpi  Respiratory:  Negative for cough, chest tightness, shortness of breath and  wheezing.   Cardiovascular:  Negative for chest pain, palpitations and leg swelling.  Musculoskeletal:  Positive for arthralgias (hip, buttock pain, see hpi).   Obj D50 her problem ective: Arielle There were no vitals taken for this visit.      BP Readings from Last 3 Encounters:  11/06/20 118/72  04/27/20 112/72  03/06/20 110/68   Wt Readings from Last 3 Encounters:  11/06/20 230 lb 8 oz (104.6 kg)  04/27/20 236 lb 14.4 oz (107.5 kg)  03/06/20 229 lb 3.2 oz (104 kg)    EXAM:  GENERAL:  alert, oriented, appears well and in no acute distress  HEENT: atraumatic, conjunctiva clear, no obvious abnormalities on inspection of external nose and ears  NECK: normal movements of the head and neck  LUNGS: on inspection no signs of respiratory distress, breathing rate appears normal, no obvious gross SOB, gasping or wheezing  CV: no obvious cyanosis  MS: moves all visible extremities without noticeable abnormality  PSYCH/NEURO: pleasant and cooperative, no obvious depression or anxiety, speech and thought processing grossly intact   Assessment/Plan 1. Right hip pain Discussed stretches - great toe pose, figure four to help with hip stretching; ok to continue w advil - suggest '600mg'$  with food 2-3 times daily up to 2 weeks. If not better at that time consider PT or sports med.   2. Bilateral epiphora Continue with allergra. Suspect related to all of prior congestion and baseline allergies. Trial flonase x 2 weeks. Discussed new medication(s) today with patient. Discussed potential side effects and patient verbalized understanding.      I discussed the assessment and treatment plan with the patient. The patient was provided an opportunity to ask questions and all were answered. The patient agreed with the plan and demonstrated an understanding of the instructions.   The patient was advised to call back or seek an in-person evaluation if the symptoms worsen or if the condition fails to improve as anticipated.  I provided 15 minutes of non-face-to-face time during this encounter.   Micheline Rough, MD

## 2021-01-08 ENCOUNTER — Encounter: Payer: Self-pay | Admitting: Family Medicine

## 2021-01-08 ENCOUNTER — Other Ambulatory Visit: Payer: Self-pay

## 2021-01-08 ENCOUNTER — Ambulatory Visit (INDEPENDENT_AMBULATORY_CARE_PROVIDER_SITE_OTHER): Payer: Medicare Other | Admitting: Family Medicine

## 2021-01-08 VITALS — BP 108/58 | HR 85 | Temp 98.2°F | Ht 79.25 in | Wt 235.0 lb

## 2021-01-08 DIAGNOSIS — M533 Sacrococcygeal disorders, not elsewhere classified: Secondary | ICD-10-CM

## 2021-01-08 NOTE — Progress Notes (Signed)
[name] DOB: 04-18-1956 Encounter date: 01/08/2021  This is a 65 y.o. male who presents with Chief Complaint  Patient presents with   Follow-up    Patient complains of recurrent right hip and leg pain since last visit, taking Voltaren with some relief    History of present illness: Not getting any better since last visit - wrist, ankles, knee mostly on right side. Taking voltaren and advil which helps, but it comes right back. Movement not restricted. No restriction with back mobility. Tenderness is localized right gluteus. Has had sciatic in past; this feels similar but not constant.   Eye tearing better with flonase.    Allergies  Allergen Reactions   Bee Venom Anaphylaxis   Iodine Solution [Povidone Iodine] Itching    Patient states his arm was irritated following a previous arm surgery where he had been scrubbed with betadine.   Current Meds  Medication Sig   albuterol (VENTOLIN HFA) 108 (90 Base) MCG/ACT inhaler TAKE 2 PUFFS BY MOUTH EVERY 6 HOURS AS NEEDED FOR WHEEZE OR SHORTNESS OF BREATH   ciclopirox (PENLAC) 8 % solution Apply topically at bedtime. Apply over nail and surrounding skin. Apply daily over previous coat. After seven (7) days, may remove with alcohol and continue cycle.   citalopram (CELEXA) 10 MG tablet TAKE 1 TABLET BY MOUTH  DAILY   EPINEPHrine 0.3 mg/0.3 mL IJ SOAJ injection Inject 0.3 mLs (0.3 mg total) into the muscle as needed for anaphylaxis.   fexofenadine (ALLEGRA) 180 MG tablet Take 180 mg by mouth daily.   fish oil-omega-3 fatty acids 1000 MG capsule Take 1 g by mouth daily.   fluticasone (FLONASE) 50 MCG/ACT nasal spray Place 1 spray into both nostrils daily.   Glucosamine-Chondroitin (GLUCOSAMINE CHONDR COMPLEX PO) Take 1 tablet by mouth 2 (two) times daily.   magnesium 30 MG tablet Take 30 mg by mouth daily.   Multiple Vitamin (MULITIVITAMIN WITH MINERALS) TABS Take 1 tablet by mouth daily.   Spacer/Aero-Holding Chambers DEVI 1 Device by  Does not apply route daily as needed.   [DISCONTINUED] chlorpheniramine-HYDROcodone (TUSSIONEX PENNKINETIC ER) 10-8 MG/5ML SUER Take 5 mLs by mouth every 12 (twelve) hours as needed for cough.    Review of Systems  Constitutional:  Negative for chills, fatigue and fever.  Respiratory:  Negative for cough, chest tightness, shortness of breath and wheezing.   Cardiovascular:  Negative for chest pain, palpitations and leg swelling.  Musculoskeletal:  Positive for arthralgias (see hpi).   Objective:  BP (!) 108/58 (BP Location: Left Arm, Patient Position: Sitting, Cuff Size: Large)   Pulse 85   Temp 98.2 F (36.8 C) (Oral)   Ht 6' 7.25" (2.013 m)   Wt 235 lb (106.6 kg)   BMI 26.31 kg/m   Weight: 235 lb (106.6 kg)   BP Readings from Last 3 Encounters:  01/08/21 (!) 108/58  11/06/20 118/72  04/27/20 112/72   Wt Readings from Last 3 Encounters:  01/08/21 235 lb (106.6 kg)  11/06/20 230 lb 8 oz (104.6 kg)  04/27/20 236 lb 14.4 oz (107.5 kg)    Physical Exam Constitutional:      General: He is not in acute distress.    Appearance: He is well-developed.  Cardiovascular:     Rate and Rhythm: Normal rate and regular rhythm.     Heart sounds: Normal heart sounds. No murmur heard.   No friction rub.  Pulmonary:     Effort: Pulmonary effort is normal. No respiratory distress.  Breath sounds: Normal breath sounds. No wheezing or rales.  Musculoskeletal:     Right lower leg: No edema.     Left lower leg: No edema.     Comments: No significant pain with flexion or extension of the back.  Mild discomfort in the right SI area with left twist and extension.  No significant pain with hip flexion, extension, internal rotation.  There is mild tenderness to palpation SI area on the right.  Neurological:     Mental Status: He is alert and oriented to person, place, and time.     Deep Tendon Reflexes:     Reflex Scores:      Patellar reflexes are 2+ on the right side and 2+ on the left  side.      Achilles reflexes are 2+ on the right side and 2+ on the left side.    Comments: Negative straight leg raise.  Psychiatric:        Behavior: Behavior normal.    Assessment/Plan 1. Sacroiliac pain We discussed either referral to sports medicine or physical therapy.  Consider imaging if any worsening or if not improving with conservative treatments. - Ambulatory referral to Sports Medicine   Return if symptoms worsen or fail to improve.    Micheline Rough, MD

## 2021-01-29 NOTE — Progress Notes (Signed)
[place] Phone: [phone] Subjective:   Jeffrey [name], am serving as a scribe for Dr. Hulan Saas. This visit occurred during the SARS-CoV-2 public health emergency.  Safety protocols were in place, including screening questions prior to the visit, additional usage of staff PPE, and extensive cleaning of exam room while observing appropriate contact time as indicated for disinfecting solutions.   I'm seeing this patient by the request  of:  Caren Macadam, MD  CC:   [id number]  Jeffrey [name] is a 65 y.o. male coming in with complaint of LBP, R leg and hip pain. Patient having leg numbness for one month. Recently seen for SI joint pain. Patient states that he has history of sciatic nerve issues and was seeing chiropractor. Patient notes having arthritis in B knees and that he rolled his R ankle last year. Having intermittent pain throughout R leg. Starts in glute but can be in hip, knee, or ankle. Patient does have radiating symptoms down R leg at times. Pain is more of an annoyance than hindering his activity. Has had both knees scoped 1 decade ago.      Past Medical History:  Diagnosis Date   Abnormal Doppler ultrasound of carotid artery 07-06-2012   normal carotid doppler   Acute appendicitis 03/30/2017   AF (atrial fibrillation) (HCC)    Appendicitis, acute 03/30/2017   Chronic knee pain 07/23/2015   H/O echocardiogram 06-26-2012   Transthoracic echo    EF 55-60%   H/O prior ablation treatment 2011   History of atrial flutter    History of cardiac monitoring 06-15-2012   sinus    Hx of bee sting allergy 07/23/2015   Normal cardiac stress test 05-24-2007   low risk scan   Past Surgical History:  Procedure Laterality Date   ATRIAL FIBRILLATION ABLATION     LAPAROSCOPIC APPENDECTOMY N/A 03/30/2017   Procedure: APPENDECTOMY LAPAROSCOPIC;  Surgeon: Armandina Gemma, MD;  Location: [facility];  Service:  General;  Laterality: N/A;   left testicle removed ([name] cancer, benign mass)     othroscopic knee surgery bil     vericose veins stripped both legs     Social History   Socioeconomic History   Marital status: Married    Spouse name: Not on file   Number of children: Not on file   Years of education: Not on file   Highest education level: Not on file  Occupational History   Occupation: Freight forwarder  Tobacco Use   Smoking status: Former    Packs/day: 1.50    Years: 8.00    Pack years: 12.00    Types: Cigarettes    Quit date: 07/11/1977    Years since quitting: 43.5   Smokeless tobacco: Never  Substance and Sexual Activity   Alcohol use: Yes    Alcohol/week: 21.0 standard drinks    Types: 21 Standard drinks or equivalent per week   Drug use: [name]   Sexual activity: Not on file  Other Topics Concern   Not on file  Social History Narrative   Work or School: [employer] [place] Situation: lives with wife      Spiritual Beliefs: none      Lifestyle: gym 3 days per week; diet is healthy      Social Determinants of Radio broadcast assistant Strain: Low Risk    Difficulty of Paying Living Expenses: Not hard at  all  Food Insecurity: [name] Food Insecurity   Worried About Charity fundraiser in the Last Year: Never true   Ran Out of Food in the Last Year: Never true  Transportation Needs: [name] Transportation Needs   Lack of Transportation (Medical): [name]   Lack of Transportation (Non-Medical): [name]  Physical Activity: Sufficiently Active   Days of Exercise per Week: 3 days   Minutes of Exercise per Session: 60 min  Stress: [name] Stress Concern Present   Feeling of Stress : Not at all  Social Connections: Moderately Integrated   Frequency of Communication with Friends and Family: Three times a week   Frequency of Social Gatherings with Friends and Family: Three times a week   Attends Religious Services: Never   Active Member of Clubs or Organizations: Yes   Attends Programme researcher, broadcasting/film/video: More than 4 times per year   Marital Status: Married   Allergies  Allergen Reactions   Bee Venom Anaphylaxis   Iodine Solution [Povidone Iodine] Itching    Patient states his arm was irritated following a previous arm surgery where he had been scrubbed with betadine.   Family History  Problem Relation Age of Onset   Alcohol abuse Mother    Healthy Mother    Alcohol abuse Father    COPD Father        IT trainer, smoker   Arthritis Maternal Grandmother    Cancer Maternal Grandmother        ?abdominal   Breast cancer Sister    Alzheimer's disease Maternal Grandfather    Heart attack Paternal Grandfather      Current Outpatient Medications (Cardiovascular):    EPINEPHrine 0.3 mg/0.3 mL IJ SOAJ injection, Inject 0.3 mLs (0.3 mg total) into the muscle as needed for anaphylaxis.  Current Outpatient Medications (Respiratory):    fexofenadine (ALLEGRA) 180 MG tablet, Take 180 mg by mouth daily.   albuterol (VENTOLIN HFA) 108 (90 Base) MCG/ACT inhaler, TAKE 2 PUFFS BY MOUTH EVERY 6 HOURS AS NEEDED FOR WHEEZE OR SHORTNESS OF BREATH   fluticasone (FLONASE) 50 MCG/ACT nasal spray, Place 1 spray into both nostrils daily.    Current Outpatient Medications (Other):    ciclopirox (PENLAC) 8 % solution, Apply topically at bedtime. Apply over nail and surrounding skin. Apply daily over previous coat. After seven (7) days, may remove with alcohol and continue cycle.   citalopram (CELEXA) 10 MG tablet, TAKE 1 TABLET BY MOUTH  DAILY   fish oil-omega-3 fatty acids 1000 MG capsule, Take 1 g by mouth daily.   Glucosamine-Chondroitin (GLUCOSAMINE CHONDR COMPLEX PO), Take 1 tablet by mouth 2 (two) times daily.   magnesium 30 MG tablet, Take 30 mg by mouth daily.   Multiple Vitamin (MULITIVITAMIN WITH MINERALS) TABS, Take 1 tablet by mouth daily.   Spacer/Aero-Holding Dorise Bullion, 1 Device by Does not apply route daily as needed.   Reviewed prior external information  including notes and imaging from  primary care provider including most recent visit in September of this year. As well as notes that were available from care everywhere and other healthcare systems.  Past medical history, social, surgical and family history all reviewed in electronic medical record.  [name] pertanent information unless stated regarding to the chief complaint.   Review of Systems:  [name] headache, visual changes, nausea, vomiting, diarrhea, constipation, dizziness, abdominal pain, skin rash, fevers, chills, night sweats, weight loss, swollen lymph nodes, body aches, joint swelling, chest pain, shortness of breath, mood changes. POSITIVE muscle  aches but mild  Objective  Blood pressure 110/80, pulse 74, height 6\' 7"  (2.007 m), weight 238 lb (108 kg), SpO2 97 %.   General: [name] apparent distress alert and oriented x3 mood and affect normal, dressed appropriately.  HEENT: Pupils equal, extraocular movements intact  Respiratory: Patient's speak in full sentences and does not appear short of breath  Cardiovascular: [name] lower extremity edema, non tender, [name] erythema  Gait normal with good balance and coordination.  MSK: Low back exam does have some mild loss of lordosis.  Some mild tightness with FABER test bilaterally.  Negative straight leg test.  Mild tenderness noted in the paraspinal musculature but nothing severe.  Neurovascular intact distally.  Patient does have some arthritic changes of the knees noted.  Tenderness over the sacroiliac joint noted bilaterally.   [id number]; 15 additional minutes spent for Therapeutic exercises as stated in above notes.  This included exercises focusing on stretching, strengthening, with significant focus on eccentric aspects.   Long term goals include an improvement in range of motion, strength, endurance as well as avoiding reinjury. Patient's frequency would include in 1-2 times a day, 3-5 times a week for a duration of 6-12 weeks.  Hip strengthening  exercises which included:  Pelvic tilt/bracing to help with proper recruitment of the lower abs and pelvic floor muscles  Glute strengthening to properly contract glutes without over-engaging low back and hamstrings - prone hip extension and glute bridge exercises Proper stretching techniques to increase effectiveness for the hip flexors, groin, quads, piriformic and low back when appropriate  Proper technique shown and discussed handout in great detail with ATC.  All questions were discussed and answered.     Impression and Recommendations:    The above documentation has been reviewed and is accurate and complete Lyndal Pulley, DO

## 2021-02-01 ENCOUNTER — Encounter: Payer: Self-pay | Admitting: Family Medicine

## 2021-02-01 ENCOUNTER — Ambulatory Visit (INDEPENDENT_AMBULATORY_CARE_PROVIDER_SITE_OTHER): Payer: Medicare Other | Admitting: Family Medicine

## 2021-02-01 ENCOUNTER — Other Ambulatory Visit: Payer: Self-pay

## 2021-02-01 ENCOUNTER — Ambulatory Visit (INDEPENDENT_AMBULATORY_CARE_PROVIDER_SITE_OTHER): Payer: Medicare Other

## 2021-02-01 VITALS — BP 110/80 | HR 74 | Ht 79.0 in | Wt 238.0 lb

## 2021-02-01 DIAGNOSIS — G8929 Other chronic pain: Secondary | ICD-10-CM | POA: Diagnosis not present

## 2021-02-01 DIAGNOSIS — M25571 Pain in right ankle and joints of right foot: Secondary | ICD-10-CM | POA: Diagnosis not present

## 2021-02-01 DIAGNOSIS — M25551 Pain in right hip: Secondary | ICD-10-CM

## 2021-02-01 DIAGNOSIS — M545 Low back pain, unspecified: Secondary | ICD-10-CM

## 2021-02-01 DIAGNOSIS — M47816 Spondylosis without myelopathy or radiculopathy, lumbar region: Secondary | ICD-10-CM | POA: Diagnosis not present

## 2021-02-01 DIAGNOSIS — M48061 Spinal stenosis, lumbar region without neurogenic claudication: Secondary | ICD-10-CM | POA: Diagnosis not present

## 2021-02-01 DIAGNOSIS — M1611 Unilateral primary osteoarthritis, right hip: Secondary | ICD-10-CM | POA: Diagnosis not present

## 2021-02-01 DIAGNOSIS — M25569 Pain in unspecified knee: Secondary | ICD-10-CM | POA: Diagnosis not present

## 2021-02-01 NOTE — Assessment & Plan Note (Signed)
Known arthritic changes.  Discussed with patient icing regimen and home exercises.  Discussed with patient that potential physical therapy will be beneficial.  Do feel that more the discomfort is coming from the lower back.  Patient is wanting to be more preventative than anything else as well.  I do feel that majority of the discomfort is likely coming more to low back pain.  I do feel that physical therapy will be more beneficial.  Follow-up with me again in 4 to 6 weeks.  Could be a potential candidate for osteopathic manipulation depending.  X-rays are pending

## 2021-02-01 NOTE — Patient Instructions (Addendum)
Back and ankle exercises PT at [facility] Turmeric 500mg  daily  Tart cherry extract 1200mg  at night Vitamin D 2000 IU daily  Xray today See me again in 6-7 weeks

## 2021-02-10 ENCOUNTER — Other Ambulatory Visit: Payer: Self-pay

## 2021-02-10 ENCOUNTER — Encounter (HOSPITAL_BASED_OUTPATIENT_CLINIC_OR_DEPARTMENT_OTHER): Payer: Self-pay | Admitting: Physical Therapy

## 2021-02-10 ENCOUNTER — Ambulatory Visit (HOSPITAL_BASED_OUTPATIENT_CLINIC_OR_DEPARTMENT_OTHER): Payer: Medicare Other | Attending: Family Medicine | Admitting: Physical Therapy

## 2021-02-10 DIAGNOSIS — M25571 Pain in right ankle and joints of right foot: Secondary | ICD-10-CM | POA: Diagnosis not present

## 2021-02-10 DIAGNOSIS — M25562 Pain in left knee: Secondary | ICD-10-CM | POA: Diagnosis not present

## 2021-02-10 DIAGNOSIS — M5441 Lumbago with sciatica, right side: Secondary | ICD-10-CM | POA: Diagnosis not present

## 2021-02-10 DIAGNOSIS — M545 Low back pain, unspecified: Secondary | ICD-10-CM | POA: Insufficient documentation

## 2021-02-10 DIAGNOSIS — M25561 Pain in right knee: Secondary | ICD-10-CM | POA: Insufficient documentation

## 2021-02-10 DIAGNOSIS — G8929 Other chronic pain: Secondary | ICD-10-CM | POA: Diagnosis not present

## 2021-02-10 NOTE — Therapy (Signed)
OUTPATIENT PHYSICAL THERAPY LOWER EXTREMITY EVALUATION   Patient Name: Jeffrey [name] MRN: [id number] DOB:08-Aug-1955, 65 y.o., male Today's Date: 02/10/2021    Past Medical History:  Diagnosis Date   Abnormal Doppler ultrasound of carotid artery 07-06-2012   normal carotid doppler   Acute appendicitis 03/30/2017   AF (atrial fibrillation) (HCC)    Appendicitis, acute 03/30/2017   Chronic knee pain 07/23/2015   H/O echocardiogram 06-26-2012   Transthoracic echo    EF 55-60%   H/O prior ablation treatment 2011   History of atrial flutter    History of cardiac monitoring 06-15-2012   sinus    Hx of bee sting allergy 07/23/2015   Normal cardiac stress test 05-24-2007   low risk scan   Past Surgical History:  Procedure Laterality Date   ATRIAL FIBRILLATION ABLATION     LAPAROSCOPIC APPENDECTOMY N/A 03/30/2017   Procedure: APPENDECTOMY LAPAROSCOPIC;  Surgeon: Armandina Gemma, MD;  Location: [facility];  Service: General;  Laterality: N/A;   left testicle removed (no cancer, benign mass)     othroscopic knee surgery bil     vericose veins stripped both legs     Patient Active Problem List   Diagnosis Date Noted   Pulmonary nodule 04/07/2017   Bronchitis 01/29/2016   Chronic knee pain 07/23/2015   Hx of bee sting allergy 07/23/2015   Atrial fibrillation ([place]) 07/23/2015   GAD (generalized anxiety disorder) 07/23/2015   Rhinitis, allergic 07/23/2015    PCP: Caren Macadam, MD  REFERRING PROVIDER: Lyndal Pulley, DO  REFERRING DIAG:IMPRESSION: Mild lumbar spondylosis, worse at L2-L3. M25.571,G89.29 (ICD-10-CM) - Chronic pain of right ankle  THERAPY DIAG:  Low back pain; bilateral knee pain; right ankle pain   ONSET DATE:  Ankle ( October 2021) Low back and knees long history   SUBJECTIVE:   SUBJECTIVE STATEMENT: Patient had an ankle sprain in October of last year. He also has a history of bilateral knee pain. He also has had flairs of sciatica on the right side. His  last flair up was about 3 weeks ago. He feels like the right knee is a bt owrse then his left.   PERTINENT HISTORY: A-fib; bilateral Knee Scope   PAIN:  Are you having pain? No No active pain at this time   PRECAUTIONS: None  WEIGHT BEARING RESTRICTIONS No  FALLS:  Has patient fallen in last 6 months? 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 PLOF: Independent  PATIENT GOALS     To build a complete program to maintain back and knee strength   OBJECTIVE:   DIAGNOSTIC FINDINGS:    COGNITION:  Overall cognitive status: Within functional limits for tasks assessed     SENSATION:  Light touch: Appears intact  Stereognosis: Appears intact  Hot/Cold: Appears intact  Proprioception: Appears intact Can have mild numbness and tingling when he has the sciatica   POSTURE:  Good: bilateral flat foot but wearing inserts   LE AROM/PROM:  Full ROM of both hips/ Full Hip Motion   Lumbar ROM Full Lumbar ROM but pain with end range flexion and right and left side bending+ on the right   LE MMT:  MMT Right 02/10/2021 Left 02/10/2021  Hip flexion 30.4 30.5  Hip extension    Hip abduction 21.5 30.2  Hip adduction    Hip internal rotation    Hip external rotation    Knee flexion 30.4 35.2  Knee extension 39.2 28.8  Ankle dorsiflexion    Ankle plantarflexion    Ankle inversion    Ankle eversion     (Blank rows = not tested) peak force     JOINT MOBILITY ASSESSMENT:  Normal PA mobility of the lumbar spine     GAIT: Mild lateral motion with gait    TODAY'S TREATMENT: Access Code: [email or web address] URL: [email or web address] Date: 02/11/2021 Prepared by: Carolyne Littles  Exercises Small Range  Straight Leg Raise - 1 x daily - 7 x weekly - 3 sets - 10 reps Hooklying Clamshell with Resistance - 1 x daily - 7 x weekly - 3 sets - 10 reps Supine March - 1 x daily - 7 x weekly - 3 sets - 10 reps Supine Bridge - 1 x daily - 7 x weekly - 3 sets - 10 reps    PATIENT EDUCATION:  Education details: reviewed HEP; symptom management  Person educated: Patient Education method: Explanation, Demonstration, Tactile cues, Verbal cues, and Handouts Education comprehension: verbalized understanding, returned demonstration, verbal cues required, tactile cues required, and needs further education   HOME EXERCISE PROGRAM: Access Code: [email or web address] URL: [email or web address] Date: 02/11/2021 Prepared by: Carolyne Littles  Exercises Small Range Straight Leg Raise - 1 x daily - 7 x weekly - 3 sets - 10 reps Hooklying Clamshell with Resistance - 1 x daily - 7 x weekly - 3 sets - 10 reps Supine March - 1 x daily - 7 x weekly - 3 sets - 10 reps Supine Bridge - 1 x daily - 7 x weekly - 3 sets - 10 reps   ASSESSMENT:  CLINICAL IMPRESSION: Patient is a 65 year old male with a history of intermittent hip/ low back/ and right ankle pain. He is currently not having any pain but it does flair up from time to time. He would like to develop a comprehensive strengthening program and auqatics program that he can work on. He has strength deficits in both hips. He has a popping in his right knee from time to time. He would benefit from skilled therapy to improve his ability to maintain an active lifestyle.   Abnormal gait, decreased activity tolerance, decreased strength, and pain  community activity, laundry, and yard work; Architect working   1 comorbidity: multi joint arthritis   REHAB POTENTIAL: Excellent  CLINICAL DECISION MAKING: Stable/uncomplicated  EVALUATION COMPLEXITY: Low   GOALS: Goals reviewed with patient? No  SHORT TERM GOALS:  STG Name Target Date Goal status  1 Patient will be  independent with basic HEP  Baseline:  03/04/2021 INITIAL  2 Patient will demonstrate equal strength in left and right hip abduction  Baseline:  03/04/2021 INITIAL  LONG TERM GOALS:   LTG Name Target Date Goal status  1 Patient will go to the gym with an independent program for strengthening and stability, in the gym and in the pool  Baseline: 03/25/2021 INITIAL  PLAN: PT FREQUENCY: 1x/week  PT DURATION: 6 weeks  PLANNED INTERVENTIONS: Therapeutic exercises, Therapeutic activity, Gait training, Patient/Family education, Joint mobilization, Stair training, Aquatic Therapy, Dry Needling, Cryotherapy, Moist heat, Taping, and Manual therapy  PLAN FOR NEXT SESSION: review exercises given; review stretches for lower back and knee. Progress to gym activity as able.   Carney Living PT DPT  02/10/2021, 4:04 PM

## 2021-02-11 ENCOUNTER — Encounter (HOSPITAL_BASED_OUTPATIENT_CLINIC_OR_DEPARTMENT_OTHER): Payer: Self-pay | Admitting: Physical Therapy

## 2021-02-15 ENCOUNTER — Encounter (HOSPITAL_BASED_OUTPATIENT_CLINIC_OR_DEPARTMENT_OTHER): Payer: Self-pay | Admitting: Physical Therapy

## 2021-02-15 ENCOUNTER — Encounter (HOSPITAL_BASED_OUTPATIENT_CLINIC_OR_DEPARTMENT_OTHER): Payer: Medicare Other | Attending: Family Medicine | Admitting: Physical Therapy

## 2021-02-15 ENCOUNTER — Other Ambulatory Visit: Payer: Self-pay

## 2021-02-15 DIAGNOSIS — M25561 Pain in right knee: Secondary | ICD-10-CM | POA: Diagnosis not present

## 2021-02-15 DIAGNOSIS — M25562 Pain in left knee: Secondary | ICD-10-CM | POA: Diagnosis not present

## 2021-02-15 DIAGNOSIS — M5441 Lumbago with sciatica, right side: Secondary | ICD-10-CM | POA: Insufficient documentation

## 2021-02-15 DIAGNOSIS — G8929 Other chronic pain: Secondary | ICD-10-CM | POA: Insufficient documentation

## 2021-02-15 NOTE — Therapy (Signed)
OUTPATIENT PHYSICAL THERAPY TREATMENT NOTE   Patient Name: Jeffrey [name] MRN: [id number] DOB:07-28-1955, 65 y.o., male Today's Date: 02/15/2021  PCP: Caren Macadam, MD REFERRING PROVIDER: Caren Macadam, MD   PT End of Session - 02/15/21 1625     Visit Number 2    Number of Visits 12    Date for PT Re-Evaluation 03/25/21    Authorization Type [employer] medicare    PT Start Time 1345    PT Stop Time 1426    PT Time Calculation (min) 41 min    Activity Tolerance Patient tolerated treatment well    Behavior During Therapy [facility] for tasks assessed/performed             Past Medical History:  Diagnosis Date   Abnormal Doppler ultrasound of carotid artery 07-06-2012   normal carotid doppler   Acute appendicitis 03/30/2017   AF (atrial fibrillation) ([place])    Appendicitis, acute 03/30/2017   Chronic knee pain 07/23/2015   H/O echocardiogram 06-26-2012   Transthoracic echo    EF 55-60%   H/O prior ablation treatment 2011   History of atrial flutter    History of cardiac monitoring 06-15-2012   sinus    Hx of bee sting allergy 07/23/2015   Normal cardiac stress test 05-24-2007   low risk scan   Past Surgical History:  Procedure Laterality Date   ATRIAL FIBRILLATION ABLATION     LAPAROSCOPIC APPENDECTOMY N/A 03/30/2017   Procedure: APPENDECTOMY LAPAROSCOPIC;  Surgeon: Armandina Gemma, MD;  Location: [facility];  Service: General;  Laterality: N/A;   left testicle removed (no cancer, benign mass)     othroscopic knee surgery bil     vericose veins stripped both legs     Patient Active Problem List   Diagnosis Date Noted   Pulmonary nodule 04/07/2017   Bronchitis 01/29/2016   Chronic knee pain 07/23/2015   Hx of bee sting allergy 07/23/2015   Atrial fibrillation ([place]) 07/23/2015   GAD (generalized anxiety disorder) 07/23/2015   Rhinitis, allergic 07/23/2015     PCP: Caren Macadam, MD   REFERRING PROVIDER: Lyndal Pulley, DO   REFERRING DIAG:IMPRESSION: Mild  lumbar spondylosis, worse at L2-L3. M25.571,G89.29 (ICD-10-CM) - Chronic pain of right ankle   THERAPY DIAG:  Low back pain; bilateral knee pain; right ankle pain    ONSET DATE:  Ankle ( October 2021) Low back and knees long history    SUBJECTIVE:    SUBJECTIVE STATEMENT: Patient was a little sore pushing wheelchairs after doing his exercises, but overall he tolerated well.   PERTINENT HISTORY: A-fib; bilateral Knee Scope    PAIN:   TODAY'S TREATMENT: Nu-step: 5 min L4 UE and LE  Stretches: glute stretch 2x20 sec hold;  Thomas stretch 2x20 sec hold  Hamstring stretch 2x20 sec hold   Leg press 3x10 80 lbs                           Cable rows: 3x10 15lbs                           Shoulder extensions 3x10 15lbs   Row x15 green Extension x15 reviewed abdominal breathing           PATIENT EDUCATION:  Education details: reviewed HEP; symptom management; reviewed stretching for home  Person educated: Patient Education method: Explanation, Demonstration, Tactile cues, Verbal cues, and Handouts Education comprehension: verbalized understanding,  returned demonstration, verbal cues required, tactile cues required, and needs further education     HOME EXERCISE PROGRAM: Access Code: [facility] URL: [email or web address] Date: 02/11/2021 Prepared by: Carolyne Littles   Exercises Small Range Straight Leg Raise - 1 x daily - 7 x weekly - 3 sets - 10 reps Hooklying Clamshell with Resistance - 1 x daily - 7 x weekly - 3 sets - 10 reps Supine March - 1 x daily - 7 x weekly - 3 sets - 10 reps Supine Bridge - 1 x daily - 7 x weekly - 3 sets - 10 reps     ASSESSMENT:   CLINICAL IMPRESSION: Therapy updated HEP. He was given standing rows and extensions. He was also shown how to do them on the machines. He was warmed up on the nu-step. He was shown how to set up and fit. He will begin coming to the gym. He was advised to ramp up his time as able. He was also given  stretches today. He had minor pain in his left knee with the leg press. Therapy will progress as tolerated.     Abnormal gait, decreased activity tolerance, decreased strength, and pain   community activity, laundry, and yard work; wood working    1 comorbidity: multi joint arthritis     Carney Living PT DPT  02/15/2021, 4:27 PM

## 2021-02-24 ENCOUNTER — Encounter (HOSPITAL_BASED_OUTPATIENT_CLINIC_OR_DEPARTMENT_OTHER): Payer: Self-pay | Admitting: Physical Therapy

## 2021-02-24 ENCOUNTER — Ambulatory Visit (HOSPITAL_BASED_OUTPATIENT_CLINIC_OR_DEPARTMENT_OTHER): Payer: Medicare Other | Attending: Family Medicine | Admitting: Physical Therapy

## 2021-02-24 ENCOUNTER — Other Ambulatory Visit: Payer: Self-pay

## 2021-02-24 DIAGNOSIS — G8929 Other chronic pain: Secondary | ICD-10-CM | POA: Diagnosis not present

## 2021-02-24 DIAGNOSIS — M25561 Pain in right knee: Secondary | ICD-10-CM | POA: Insufficient documentation

## 2021-02-24 DIAGNOSIS — M5441 Lumbago with sciatica, right side: Secondary | ICD-10-CM | POA: Diagnosis not present

## 2021-02-24 DIAGNOSIS — M25562 Pain in left knee: Secondary | ICD-10-CM | POA: Diagnosis not present

## 2021-02-24 NOTE — Therapy (Signed)
OUTPATIENT PHYSICAL THERAPY TREATMENT NOTE   Patient Name: Jeffrey [name] MRN: [id number] DOB:March 09, 1956, 65 y.o., male Today's Date: 02/24/2021  PCP: Caren Macadam, MD REFERRING PROVIDER: Caren Macadam, MD   PT End of Session - 02/24/21 1436     Visit Number 3    Number of Visits 12    Date for PT Re-Evaluation 03/25/21    Authorization Type [employer] medicare    PT Start Time 1430    PT Stop Time 1510    PT Time Calculation (min) 40 min    Activity Tolerance Patient tolerated treatment well    Behavior During Therapy [facility] for tasks assessed/performed             Past Medical History:  Diagnosis Date   Abnormal Doppler ultrasound of carotid artery 07-06-2012   normal carotid doppler   Acute appendicitis 03/30/2017   AF (atrial fibrillation) ([place])    Appendicitis, acute 03/30/2017   Chronic knee pain 07/23/2015   H/O echocardiogram 06-26-2012   Transthoracic echo    EF 55-60%   H/O prior ablation treatment 2011   History of atrial flutter    History of cardiac monitoring 06-15-2012   sinus    Hx of bee sting allergy 07/23/2015   Normal cardiac stress test 05-24-2007   low risk scan   Past Surgical History:  Procedure Laterality Date   ATRIAL FIBRILLATION ABLATION     LAPAROSCOPIC APPENDECTOMY N/A 03/30/2017   Procedure: APPENDECTOMY LAPAROSCOPIC;  Surgeon: Armandina Gemma, MD;  Location: [facility];  Service: General;  Laterality: N/A;   left testicle removed (no cancer, benign mass)     othroscopic knee surgery bil     vericose veins stripped both legs     Patient Active Problem List   Diagnosis Date Noted   Pulmonary nodule 04/07/2017   Bronchitis 01/29/2016   Chronic knee pain 07/23/2015   Hx of bee sting allergy 07/23/2015   Atrial fibrillation ([place]) 07/23/2015   GAD (generalized anxiety disorder) 07/23/2015   Rhinitis, allergic 07/23/2015   PCP: Caren Macadam, MD   REFERRING PROVIDER: Lyndal Pulley, DO   REFERRING DIAG:IMPRESSION: Mild lumbar  spondylosis, worse at L2-L3. M25.571,G89.29 (ICD-10-CM) - Chronic pain of right ankle   THERAPY DIAG:  Low back pain; bilateral knee pain; right ankle pain    ONSET DATE:  Ankle ( October 2021) Low back and knees long history    SUBJECTIVE:    SUBJECTIVE STATEMENT: Patient reports he has been feeling pretty good. He has been working on his exercises. He joined the gym yesterday. He has his assement on Friday.   PERTINENT HISTORY: A-fib; bilateral Knee Scope    PAIN:  Are you having pain? No No active pain at this time   PRECAUTIONS: None   WEIGHT BEARING RESTRICTIONS No   FALLS:  Has patient fallen in last 6 months? 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 PLOF: Independent   PATIENT GOALS      To build a complete program to maintain back and knee strength     OBJECTIVE:         TODAY'S TREATMENT:    10/24  Nu-step: 5 min L4 UE and LE  Stretches: glute  stretch 2x20 sec hold;  Thomas stretch 2x20 sec hold  Hamstring stretch 2x20 sec hold    Leg press 3x10 80 lbs                           Cable rows: 3x10 15lbs                           Shoulder extensions 3x10 15lbs                       Row x15 green                 Extension x15 reviewed abdominal breathing        11/2 Hamstring stretch with strap  SLR 2x10  Brdige with band 2x10  SL SLR 2x10 each leg   Standing lateral band walk: 2x10 bilateral           PATIENT EDUCATION:  Education details: reviewed HEP; symptom management  Person educated: Patient Education method: Explanation, Demonstration, Tactile cues, Verbal cues, and Handouts Education comprehension: verbalized understanding, returned demonstration, verbal cues  required, tactile cues required, and needs further education     HOME EXERCISE PROGRAM: Access Code: [email or web address] URL: [email or web address] Date: 02/11/2021 Prepared by: Carolyne Littles   Exercises Small Range Straight Leg Raise - 1 x daily - 7 x weekly - 3 sets - 10 reps Hooklying Clamshell with Resistance - 1 x daily - 7 x weekly - 3 sets - 10 reps Supine March - 1 x daily - 7 x weekly - 3 sets - 10 reps Supine Bridge - 1 x daily - 7 x weekly - 3 sets - 10 reps     ASSESSMENT:   CLINICAL IMPRESSION: Patient continues to tolerate treatment well. Therapy added in lateral band walk and a banded bridge for his HEP. We also reviewed UE exercises that he can do at the gym. He was shown how to do a pallof press as well as cable chops. He is making good progress. We will review pool exercises with his next visit. If he is doing well we will likely put him on hold.    Abnormal gait, decreased activity tolerance, decreased strength, and pain   community activity, laundry, and yard work; Architect working    1 comorbidity: multi joint arthritis    REHAB POTENTIAL: Excellent   CLINICAL DECISION MAKING: Stable/uncomplicated   EVALUATION COMPLEXITY: Low     GOALS: Goals reviewed with patient? No   SHORT TERM GOALS:   STG Name Target Date Goal status  1 Patient will be independent with basic HEP  Baseline:  03/04/2021 INITIAL  2 Patient will demonstrate equal strength in left and right hip abduction  Baseline:  03/04/2021 INITIAL  LONG TERM GOALS:    LTG Name Target Date Goal status  1 Patient will go to the gym with an independent program for strengthening and stability, in the gym and in the pool  Baseline: 03/25/2021 INITIAL  PLAN: PT FREQUENCY: 1x/week   PT DURATION: 6 weeks   PLANNED INTERVENTIONS: Therapeutic exercises, Therapeutic activity, Gait training, Patient/Family education, Joint mobilization, Stair training, Aquatic Therapy, Dry Needling, Cryotherapy, Moist  heat, Taping, and Manual therapy   PLAN FOR NEXT SESSION: review exercises given; review stretches for lower back and knee. Progress to gym activity as able.       Carney Living PT DPT  02/24/2021,  3:22 PM

## 2021-03-02 ENCOUNTER — Ambulatory Visit (HOSPITAL_BASED_OUTPATIENT_CLINIC_OR_DEPARTMENT_OTHER): Payer: Medicare Other | Admitting: Physical Therapy

## 2021-03-02 ENCOUNTER — Other Ambulatory Visit: Payer: Self-pay

## 2021-03-02 DIAGNOSIS — M25562 Pain in left knee: Secondary | ICD-10-CM | POA: Diagnosis not present

## 2021-03-02 DIAGNOSIS — M25561 Pain in right knee: Secondary | ICD-10-CM | POA: Diagnosis not present

## 2021-03-02 DIAGNOSIS — G8929 Other chronic pain: Secondary | ICD-10-CM | POA: Diagnosis not present

## 2021-03-02 DIAGNOSIS — M5441 Lumbago with sciatica, right side: Secondary | ICD-10-CM | POA: Diagnosis not present

## 2021-03-03 ENCOUNTER — Encounter (HOSPITAL_BASED_OUTPATIENT_CLINIC_OR_DEPARTMENT_OTHER): Payer: Self-pay | Admitting: Physical Therapy

## 2021-03-03 NOTE — Therapy (Signed)
OUTPATIENT PHYSICAL THERAPY TREATMENT NOTE/ Discharge    Patient Name: Jeffrey [name] MRN: [id number] DOB:1955/12/15, 65 y.o., male Today's Date: 03/03/2021  PCP: Caren Macadam, MD REFERRING PROVIDER: Caren Macadam, MD   PT End of Session - 03/03/21 0835     Visit Number 4    Number of Visits 12    Date for PT Re-Evaluation 03/25/21    Authorization Type [employer] medicare    PT Start Time 0930    PT Stop Time 1012    PT Time Calculation (min) 42 min    Activity Tolerance Patient tolerated treatment well    Behavior During Therapy [facility] for tasks assessed/performed             Past Medical History:  Diagnosis Date   Abnormal Doppler ultrasound of carotid artery 07-06-2012   normal carotid doppler   Acute appendicitis 03/30/2017   AF (atrial fibrillation) ([place])    Appendicitis, acute 03/30/2017   Chronic knee pain 07/23/2015   H/O echocardiogram 06-26-2012   Transthoracic echo    EF 55-60%   H/O prior ablation treatment 2011   History of atrial flutter    History of cardiac monitoring 06-15-2012   sinus    Hx of bee sting allergy 07/23/2015   Normal cardiac stress test 05-24-2007   low risk scan   Past Surgical History:  Procedure Laterality Date   ATRIAL FIBRILLATION ABLATION     LAPAROSCOPIC APPENDECTOMY N/A 03/30/2017   Procedure: APPENDECTOMY LAPAROSCOPIC;  Surgeon: Armandina Gemma, MD;  Location: [facility];  Service: General;  Laterality: N/A;   left testicle removed (no cancer, benign mass)     othroscopic knee surgery bil     vericose veins stripped both legs     Patient Active Problem List   Diagnosis Date Noted   Pulmonary nodule 04/07/2017   Bronchitis 01/29/2016   Chronic knee pain 07/23/2015   Hx of bee sting allergy 07/23/2015   Atrial fibrillation ([place]) 07/23/2015   GAD (generalized anxiety disorder) 07/23/2015   Rhinitis, allergic 07/23/2015   PCP: Caren Macadam, MD   REFERRING PROVIDER: Lyndal Pulley, DO   REFERRING  DIAG:IMPRESSION: Mild lumbar spondylosis, worse at L2-L3. M25.571,G89.29 (ICD-10-CM) - Chronic pain of right ankle   THERAPY DIAG:  Low back pain; bilateral knee pain; right ankle pain    ONSET DATE:  Ankle ( October 2021) Low back and knees long history    SUBJECTIVE:    SUBJECTIVE STATEMENT: Patient has no complaints at this time. He has been going to the gym and has been feeling good doing that.  PERTINENT HISTORY: A-fib; bilateral Knee Scope    PAIN:  Are you having pain? No No active pain at this time   PRECAUTIONS: None   WEIGHT BEARING RESTRICTIONS No   FALLS:  Has patient fallen in last 6 months? 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 PLOF: Independent   PATIENT GOALS      To build a complete program to maintain back and knee strength     OBJECTIVE:         TODAY'S TREATMENT:     10/24   Nu-step: 5 min L4 UE and LE  Stretches: glute stretch 2x20  sec hold;  Thomas stretch 2x20 sec hold  Hamstring stretch 2x20 sec hold    Leg press 3x10 80 lbs                           Cable rows: 3x10 15lbs                           Shoulder extensions 3x10 15lbs                       Row x15 green                 Extension x15 reviewed abdominal breathing          11/2 Hamstring stretch with strap  SLR 2x10  Brdige with band 2x10  SL SLR 2x10 each leg    Standing lateral band walk: 2x10 bilateral     11/8  Pt seen for aquatic therapy today.  Treatment took place in water 3.25-4 ft in depth at the [facility] pool. Temp of water was 91.  Pt entered/exited the pool via stairs (step through pattern) independently with bilat rail.  Introduction to water. Had patient stand at different  levels so she could feel the bouncy   Warm up: heel/toe walking x4 laps across pool chest deep side stepping x4 laps from shallow to deep;  Ambulation with noodle support: long strides, march  Exercises; Slow march x20; squats x20; hip extension x20; hip abduction x20; Sit to stand x20;    board trunk flexion x10 lateral board rotation x10 each way seated   With neck noddle and foot float; push pull x20; side to side perturbations from the feet x20; press and push x20  Steps step up x20 each leg; lateral step up x20 each leg;   Seated LAQ and March x20 2lbs   Noodle push and pull x20 with core breathing; noodle press x20 with core breathing   Weights: ranbow weight extension x20   Yellow weight extension x20      Pt requires buoyancy for support and to offload joints with strengthening exercises. Viscosity of the water is needed for resistance of strengthening; water current perturbations provides challenge to standing balance unsupported, requiring increased core activation.          PATIENT EDUCATION:  Education details: reviewed HEP; symptom management  Person educated: Patient Education method: Explanation, Demonstration, Tactile cues, Verbal cues, and Handouts Education comprehension: verbalized understanding, returned demonstration, verbal cues required, tactile cues required, and needs further education     HOME EXERCISE PROGRAM: Access Code: [email or web address] URL: [email or web address] Date: 02/11/2021 Prepared by: Carolyne Littles   Exercises Small Range Straight Leg Raise - 1 x daily - 7 x weekly - 3 sets - 10 reps Hooklying Clamshell with Resistance - 1 x daily - 7 x weekly - 3 sets - 10 reps Supine March - 1 x daily - 7 x weekly - 3 sets - 10 reps Supine Bridge - 1 x daily - 7 x weekly - 3 sets - 10 reps     ASSESSMENT:   CLINICAL IMPRESSION: Patient has reached goals for therapy. He has a gym program focusing on LE and core strengthening. Therapy  reviewed a pool program with him today. He is comfortable with his program. He is having very little pain at this time. He did well with pool exercises and concepts for the water. We  will D?C to HEP at this time.   Abnormal gait, decreased activity tolerance, decreased strength, and pain   community activity, laundry, and yard work; Architect working    1 comorbidity: multi joint arthritis    REHAB POTENTIAL: Excellent   CLINICAL DECISION MAKING: Stable/uncomplicated   EVALUATION COMPLEXITY: Low     GOALS: Goals reviewed with patient? No   SHORT TERM GOALS:   STG Name Target Date Goal status  1 Patient will be independent with basic HEP  Baseline:  03/04/2021 Achieved   2 Patient will demonstrate equal strength in left and right hip abduction  Baseline:  03/04/2021 Improved hip abduction bilateral  Achieved   LONG TERM GOALS:    LTG Name Target Date Goal status  1 Patient will go to the gym with an independent program for strengthening and stability, in the gym and in the pool  Baseline: 03/25/2021 Achieved  Patient performing exercises in the gym  PLAN: PT FREQUENCY: 1x/week   PT DURATION: 6 weeks   PLANNED INTERVENTIONS: Therapeutic exercises, Therapeutic activity, Gait training, Patient/Family education, Joint mobilization, Stair training, Aquatic Therapy, Dry Needling, Cryotherapy, Moist heat, Taping, and Manual therapy   PLAN FOR NEXT SESSION: review exercises given; review stretches for lower back and knee. Progress to gym activity as able.    PHYSICAL THERAPY DISCHARGE SUMMARY  Visits from Start of Care: 4  Current functional level related to goals / functional outcomes: Improved pain while implementing a complete gym program   Remaining deficits: Mild pain at times    Education / Equipment: HEP  Patient agrees to discharge. Patient goals were met. Patient is being discharged due to meeting the stated rehab goals.    Carney Living PT DPT  03/03/2021, 12:50  PM

## 2021-03-19 ENCOUNTER — Encounter: Payer: Self-pay | Admitting: Family Medicine

## 2021-03-22 NOTE — Progress Notes (Signed)
[place] Phone: [phone] Subjective:   [name], am serving as a scribe for Dr. Hulan Saas.  This visit occurred during the SARS-CoV-2 public health emergency.  Safety protocols were in place, including screening questions prior to the visit, additional usage of staff PPE, and extensive cleaning of exam room while observing appropriate contact time as indicated for disinfecting solutions.    I'm seeing this patient by the request  of:  Caren Macadam, MD  CC: Knee pain and back pain follow-up  [id number]  02/01/2021 Known arthritic changes.  Discussed with patient icing regimen and home exercises.  Discussed with patient that potential physical therapy will be beneficial.  Do feel that more the discomfort is coming from the lower back.  Patient is wanting to be more preventative than anything else as well.  I do feel that majority of the discomfort is likely coming more to low back pain.  I do feel that physical therapy will be more beneficial.  Follow-up with me again in 4 to 6 weeks.  Could be a potential candidate for osteopathic manipulation depending.  X-rays are pending  Update 03/23/2021  Jeffrey [name] is a 65 y.o. male coming in with complaint of B knee and back pain. Patient has been doing PT. Patient states that he is doing much better. Did formal PT which helped alleviate his pain. Continues to use natural supplements suggested at last visit.  Would state that he is feeling 95% better.  02/01/2021 Lumbar spine xray IMPRESSION: Mild lumbar spondylosis, worse at L2-L3.  02/01/2021 R hip xray IMPRESSION: Very mild degenerative change of the right hip.       Past Medical History:  Diagnosis Date   Abnormal Doppler ultrasound of carotid artery 07-06-2012   normal carotid doppler   Acute appendicitis 03/30/2017   AF (atrial fibrillation) ([facility])    Appendicitis, acute 03/30/2017    Chronic knee pain 07/23/2015   H/O echocardiogram 06-26-2012   Transthoracic echo    EF 55-60%   H/O prior ablation treatment 2011   History of atrial flutter    History of cardiac monitoring 06-15-2012   sinus    Hx of bee sting allergy 07/23/2015   Normal cardiac stress test 05-24-2007   low risk scan   Past Surgical History:  Procedure Laterality Date   ATRIAL FIBRILLATION ABLATION     LAPAROSCOPIC APPENDECTOMY N/A 03/30/2017   Procedure: APPENDECTOMY LAPAROSCOPIC;  Surgeon: Armandina Gemma, MD;  Location: [facility];  Service: General;  Laterality: N/A;   left testicle removed (no cancer, benign mass)     othroscopic knee surgery bil     vericose veins stripped both legs     Social History   Socioeconomic History   Marital status: Married    Spouse name: Not on file   Number of children: Not on file   Years of education: Not on file   Highest education level: Associate degree: occupational, Hotel manager, or vocational program  Occupational History   Occupation: Freight forwarder  Tobacco Use   Smoking status: Former    Packs/day: 1.50    Years: 8.00    Pack years: 12.00    Types: Cigarettes    Quit date: 07/11/1977    Years since quitting: 43.7   Smokeless tobacco: Never  Substance and Sexual Activity   Alcohol use: Yes    Alcohol/week: 21.0 standard drinks    Types: 21 Standard drinks or equivalent per  week   Drug use: No   Sexual activity: Not on file  Other Topics Concern   Not on file  Social History Narrative   Work or School: [employer], 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      Home Situation: lives with wife      Spiritual Beliefs: none      Lifestyle: gym 3 days per week; diet is healthy      Social Determinants of Radio broadcast assistant Strain: Low Risk    Difficulty of Paying Living Expenses: Not hard at all  Food Insecurity: No Food Insecurity   Worried About Charity fundraiser in the Last Year: Never true   Arboriculturist in the Last Year: Never true  Transportation Needs: No  Transportation Needs   Lack of Transportation (Medical): No   Lack of Transportation (Non-Medical): No  Physical Activity: Sufficiently Active   Days of Exercise per Week: 4 days   Minutes of Exercise per Session: 60 min  Stress: No Stress Concern Present   Feeling of Stress : Only a little  Social Connections: Moderately Isolated   Frequency of Communication with Friends and Family: Once a week   Frequency of Social Gatherings with Friends and Family: Once a week   Attends Religious Services: Never   Marine scientist or Organizations: No   Attends Music therapist: More than 4 times per year   Marital Status: Married   Allergies  Allergen Reactions   Bee Venom Anaphylaxis   Iodine Solution [Povidone Iodine] Itching    Patient states his arm was irritated following a previous arm surgery where he had been scrubbed with betadine.   Family History  Problem Relation Age of Onset   Alcohol abuse Mother    Healthy Mother    Alcohol abuse Father    COPD Father        IT trainer, smoker   Arthritis Maternal Grandmother    Cancer Maternal Grandmother        ?abdominal   Breast cancer Sister    Alzheimer's disease Maternal Grandfather    Heart attack Paternal Grandfather      Current Outpatient Medications (Cardiovascular):    EPINEPHrine 0.3 mg/0.3 mL IJ SOAJ injection, Inject 0.3 mLs (0.3 mg total) into the muscle as needed for anaphylaxis.  Current Outpatient Medications (Respiratory):    fexofenadine (ALLEGRA) 180 MG tablet, Take 180 mg by mouth daily.   albuterol (VENTOLIN HFA) 108 (90 Base) MCG/ACT inhaler, TAKE 2 PUFFS BY MOUTH EVERY 6 HOURS AS NEEDED FOR WHEEZE OR SHORTNESS OF BREATH   fluticasone (FLONASE) 50 MCG/ACT nasal spray, Place 1 spray into both nostrils daily.    Current Outpatient Medications (Other):    Cholecalciferol (VITAMIN D) 50 MCG (2000 UT) CAPS, Take by mouth.   ciclopirox (PENLAC) 8 % solution, Apply topically at bedtime.  Apply over nail and surrounding skin. Apply daily over previous coat. After seven (7) days, may remove with alcohol and continue cycle.   citalopram (CELEXA) 10 MG tablet, TAKE 1 TABLET BY MOUTH  DAILY   fish oil-omega-3 fatty acids 1000 MG capsule, Take 1 g by mouth daily.   Glucosamine-Chondroitin (GLUCOSAMINE CHONDR COMPLEX PO), Take 1 tablet by mouth 2 (two) times daily.   magnesium 30 MG tablet, Take 30 mg by mouth daily.   Multiple Vitamin (MULITIVITAMIN WITH MINERALS) TABS, Take 1 tablet by mouth daily.   Tart Cherry 1200 MG CAPS, Take by mouth.   Turmeric 500 MG  TABS, Take by mouth.   Spacer/Aero-Holding Dorise Bullion, 1 Device by Does not apply route daily as needed.     Objective  Blood pressure 112/70, pulse 92, height 6\' 7"  (2.007 m), weight 234 lb (106.1 kg), SpO2 97 %.   General: No apparent distress alert and oriented x3 mood and affect normal, dressed appropriately.  HEENT: Pupils equal, extraocular movements intact  Respiratory: Patient's speak in full sentences and does not appear short of breath  Cardiovascular: No lower extremity edema, non tender, no erythema  Gait normal with good balance and coordination.  MSK: Tightness noted in the lumbar spine but nontender.  Tightness noted with straight leg test.  Patient has significant left tightness with FABER test.  Nontender of the knees bilaterally.    Impression and Recommendations:     The above documentation has been reviewed and is accurate and complete Lyndal Pulley, DO

## 2021-03-23 ENCOUNTER — Encounter: Payer: Self-pay | Admitting: Family Medicine

## 2021-03-23 ENCOUNTER — Other Ambulatory Visit: Payer: Self-pay

## 2021-03-23 ENCOUNTER — Ambulatory Visit: Payer: Medicare Other | Admitting: Family Medicine

## 2021-03-23 DIAGNOSIS — G8929 Other chronic pain: Secondary | ICD-10-CM

## 2021-03-23 DIAGNOSIS — M25569 Pain in unspecified knee: Secondary | ICD-10-CM

## 2021-03-23 NOTE — Assessment & Plan Note (Signed)
Patient is no longer having any significant discomfort or pain at this time.  Discussed with patient icing regimen and home exercises.  Worsening pain can consider the injection but patient has responded extremely well to formal physical therapy.  Follow-up with me again as needed

## 2021-03-24 ENCOUNTER — Encounter: Payer: Self-pay | Admitting: Family Medicine

## 2021-03-24 ENCOUNTER — Ambulatory Visit (INDEPENDENT_AMBULATORY_CARE_PROVIDER_SITE_OTHER): Payer: Medicare Other | Admitting: Family Medicine

## 2021-03-24 VITALS — BP 116/78 | HR 73 | Temp 98.8°F | Wt 236.0 lb

## 2021-03-24 DIAGNOSIS — R052 Subacute cough: Secondary | ICD-10-CM | POA: Diagnosis not present

## 2021-03-24 DIAGNOSIS — R0982 Postnasal drip: Secondary | ICD-10-CM

## 2021-03-24 MED ORDER — PSEUDOEPH-BROMPHEN-DM 30-2-10 MG/5ML PO SYRP: 1.2500 mL | ORAL_SOLUTION | Freq: Three times a day (TID) | ORAL | 0 refills | Status: DC | PRN

## 2021-03-24 NOTE — Progress Notes (Signed)
Subjective:    Patient ID: Jeffrey [name], male    DOB: [birth year], 65 y.o.   MRN: [id number]  Chief Complaint  Patient presents with   Cough    Post nasal drip and cough. Cough is worse at night. Sudafed, nyquil, mucinex not helped. Only thing helps is tussinex.    HPI Patient was seen today for ongoing concern.  Pt with cough x 2 wks or more.  States has this every yr and the only thing that dries up the post nasal drainage is Tussonex.  Pt took a dose of left over tussonex last night.  Endorses watery eyes, frontal pressure that started today, and post nasal drainage.  Denies n/v/ fever, chills, ear pain/pressure, rhinorrhea.  States tried over-the-counter cough medicines, nasal spray, using a humidifier.  Past Medical History:  Diagnosis Date   Abnormal Doppler ultrasound of carotid artery 07-06-2012   normal carotid doppler   Acute appendicitis 03/30/2017   AF (atrial fibrillation) ([facility])    Appendicitis, acute 03/30/2017   Chronic knee pain 07/23/2015   H/O echocardiogram 06-26-2012   Transthoracic echo    EF 55-60%   H/O prior ablation treatment 2011   History of atrial flutter    History of cardiac monitoring 06-15-2012   sinus    Hx of bee sting allergy 07/23/2015   Normal cardiac stress test 05-24-2007   low risk scan    Allergies  Allergen Reactions   Bee Venom Anaphylaxis   Iodine Solution [Povidone Iodine] Itching    Patient states his arm was irritated following a previous arm surgery where he had been scrubbed with betadine.    ROS General: Denies fever, chills, night sweats, changes in weight, changes in appetite HEENT: Denies headaches, ear pain, changes in vision, rhinorrhea, sore throat +post nasal drainage CV: Denies CP, palpitations, SOB, orthopnea Pulm: Denies SOB, cough, wheezing  +cough GI: Denies abdominal pain, nausea, vomiting, diarrhea, constipation GU: Denies dysuria, hematuria, frequency, vaginal discharge Msk: Denies muscle cramps, joint  pains Neuro: Denies weakness, numbness, tingling Skin: Denies rashes, bruising Psych: Denies depression, anxiety, hallucinations     Objective:    Blood pressure 116/78, pulse 73, temperature 98.8 F (37.1 C), temperature source Oral, weight 236 lb (107 kg), SpO2 96 %.  Gen. Pleasant, well-nourished, in no distress, normal affect   HEENT: [place]/AT, face symmetric, conjunctiva clear, no scleral icterus, PERRLA, EOMI, nares patent with clear drainage, pharynx without erythema or exudate. TMs normal b/l. Lungs: no accessory muscle use, faint wheeze in posterior R base Cardiovascular: RRR, no m/r/g, no peripheral edema Musculoskeletal: No deformities, no cyanosis or clubbing, normal tone Neuro:  A&Ox3, CN II-XII intact, normal gait Skin:  Warm, no lesions/ rash   Wt Readings from Last 3 Encounters:  03/24/21 236 lb (107 kg)  03/23/21 234 lb (106.1 kg)  02/01/21 238 lb (108 kg)    Lab Results  Component Value Date   WBC 5.7 04/27/2020   HGB 15.1 04/27/2020   HCT 43.9 04/27/2020   PLT 143.0 (L) 04/27/2020   GLUCOSE 80 04/27/2020   CHOL 167 04/27/2020   TRIG 92.0 04/27/2020   HDL 54.20 04/27/2020   LDLCALC 94 04/27/2020   ALT 18 04/27/2020   AST 19 04/27/2020   NA 138 04/27/2020   K 4.3 04/27/2020   CL 100 04/27/2020   CREATININE 0.95 04/27/2020   BUN 13 04/27/2020   CO2 31 04/27/2020   TSH 1.96 07/30/2014   PSA 1.01 04/27/2020   INR 1.07 09/06/2011  HGBA1C 5.1 08/14/2017    Assessment/Plan:  Subacute cough  - Plan: brompheniramine-pseudoephedrine-DM 30-2-10 MG/5ML syrup  Post-nasal drainage  Patient advised to try switching allergy medicines from Allegra to another OTC antihistamine such as Xyzal.  Can continue saline nasal rinse or nasal spray, humidifier, for supportive care.  For brompheniramine-pseudoephedrine sent to pharmacy.  Advised to use albuterol inhaler if needed for wheezing or SOB-an inhaler from previous illness few months ago.  Follow-up with  PCP   Grier Mitts, MD

## 2021-03-24 NOTE — Patient Instructions (Signed)
You can try switching to over-the-counter Xyzal for allergy symptoms.

## 2021-04-28 ENCOUNTER — Encounter: Payer: Self-pay | Admitting: Family Medicine

## 2021-04-28 ENCOUNTER — Ambulatory Visit (INDEPENDENT_AMBULATORY_CARE_PROVIDER_SITE_OTHER): Payer: Medicare Other | Admitting: Family Medicine

## 2021-04-28 VITALS — BP 122/72 | HR 71 | Temp 98.5°F | Ht 79.0 in | Wt 237.0 lb

## 2021-04-28 DIAGNOSIS — Z0000 Encounter for general adult medical examination without abnormal findings: Secondary | ICD-10-CM

## 2021-04-28 DIAGNOSIS — Z13220 Encounter for screening for lipoid disorders: Secondary | ICD-10-CM | POA: Diagnosis not present

## 2021-04-28 DIAGNOSIS — Z131 Encounter for screening for diabetes mellitus: Secondary | ICD-10-CM

## 2021-04-28 DIAGNOSIS — R413 Other amnesia: Secondary | ICD-10-CM | POA: Diagnosis not present

## 2021-04-28 DIAGNOSIS — Z23 Encounter for immunization: Secondary | ICD-10-CM | POA: Diagnosis not present

## 2021-04-28 DIAGNOSIS — B351 Tinea unguium: Secondary | ICD-10-CM | POA: Diagnosis not present

## 2021-04-28 DIAGNOSIS — R251 Tremor, unspecified: Secondary | ICD-10-CM | POA: Diagnosis not present

## 2021-04-28 DIAGNOSIS — L989 Disorder of the skin and subcutaneous tissue, unspecified: Secondary | ICD-10-CM | POA: Diagnosis not present

## 2021-04-28 LAB — CBC WITH DIFFERENTIAL/PLATELET
Basophils Absolute: 0.1 10*3/uL (ref 0.0–0.1)
Basophils Relative: 1.4 % (ref 0.0–3.0)
Eosinophils Absolute: 0.3 10*3/uL (ref 0.0–0.7)
Eosinophils Relative: 6.1 % — ABNORMAL HIGH (ref 0.0–5.0)
HCT: 47.2 % (ref 39.0–52.0)
Hemoglobin: 16.2 g/dL (ref 13.0–17.0)
Lymphocytes Relative: 26.8 % (ref 12.0–46.0)
Lymphs Abs: 1.2 10*3/uL (ref 0.7–4.0)
MCHC: 34.4 g/dL (ref 30.0–36.0)
MCV: 89.6 fl (ref 78.0–100.0)
Monocytes Absolute: 0.3 10*3/uL (ref 0.1–1.0)
Monocytes Relative: 7.9 % (ref 3.0–12.0)
Neutro Abs: 2.5 10*3/uL (ref 1.4–7.7)
Neutrophils Relative %: 57.8 % (ref 43.0–77.0)
Platelets: 168 10*3/uL (ref 150.0–400.0)
RBC: 5.26 Mil/uL (ref 4.22–5.81)
RDW: 12.8 % (ref 11.5–15.5)
WBC: 4.3 10*3/uL (ref 4.0–10.5)

## 2021-04-28 LAB — LIPID PANEL
Cholesterol: 202 mg/dL — ABNORMAL HIGH (ref 0–200)
HDL: 60.7 mg/dL (ref >39.00)
LDL Cholesterol: 120 mg/dL — ABNORMAL HIGH (ref 0–99)
NonHDL: 141.46
Total CHOL/HDL Ratio: 3
Triglycerides: 106 mg/dL (ref 0.0–149.0)
VLDL: 21.2 mg/dL (ref 0.0–40.0)

## 2021-04-28 LAB — COMPREHENSIVE METABOLIC PANEL
ALT: 22 U/L (ref 0–53)
AST: 22 U/L (ref 0–37)
Albumin: 4.6 g/dL (ref 3.5–5.2)
Alkaline Phosphatase: 55 U/L (ref 39–117)
BUN: 12 mg/dL (ref 6–23)
CO2: 30 mEq/L (ref 19–32)
Calcium: 9.5 mg/dL (ref 8.4–10.5)
Chloride: 100 mEq/L (ref 96–112)
Creatinine, Ser: 1.06 mg/dL (ref 0.40–1.50)
GFR: 72.46 mL/min (ref >60.00)
Glucose, Bld: 88 mg/dL (ref 70–99)
Potassium: 4.5 mEq/L (ref 3.5–5.1)
Sodium: 137 mEq/L (ref 135–145)
Total Bilirubin: 1.1 mg/dL (ref 0.2–1.2)
Total Protein: 7 g/dL (ref 6.0–8.3)

## 2021-04-28 LAB — PSA: PSA: 1.02 ng/mL (ref 0.10–4.00)

## 2021-04-28 LAB — TSH: TSH: 1.86 u[IU]/mL (ref 0.35–5.50)

## 2021-04-28 MED ORDER — PROPRANOLOL HCL 20 MG PO TABS: 20.0000 mg | ORAL_TABLET | Freq: Two times a day (BID) | ORAL | 2 refills | Status: DC

## 2021-04-28 MED ORDER — CICLOPIROX 8 % EX SOLN: Freq: Every day | CUTANEOUS | 5 refills | Status: DC

## 2021-04-28 NOTE — Patient Instructions (Signed)
Start with half tab of the propranolol in the morning (10mg ) and see how you do. Monitor blood pressure (I would like you above 100/60) and heart rate (would like above 60) and ok to increase 20mg  as needed/tolerated.

## 2021-04-28 NOTE — Progress Notes (Addendum)
Jeffrey [name] DOB: 23-May-1955 Encounter date: 04/28/2021  This is a 66 y.o. male who presents for complete physical   History of present illness/Additional concerns:  He is worried about memory, short term. Feels distracted, harder to focus. Usually can retrace thinking and get back to where he was at. Notes worse in last year. Felt like he thrived when he had lots of tasks at work, but has been retired for a few years now.mood has been ok. Sometimes feels like he may need to return to citalopram 20, but generally feels that mood does well. Tends to be positive thinker; other family members are not and sometimes this gets to him.  Repeat colonoscopy due may 2025 Doesn't follow with dermatology, but willing to. Has some dry patches on nose. No worrisome lesions.   Still has some toenail fungus great toes bilat. Does feel like it is improving with healthy outgrow at base. No pain; doesn't bother him.   Past Medical History:  Diagnosis Date   Abnormal Doppler ultrasound of carotid artery 07-06-2012   normal carotid doppler   Acute appendicitis 03/30/2017   AF (atrial fibrillation) (HCC)    Appendicitis, acute 03/30/2017   Chronic knee pain 07/23/2015   H/O echocardiogram 06-26-2012   Transthoracic echo    EF 55-60%   H/O prior ablation treatment 2011   History of atrial flutter    History of cardiac monitoring 06-15-2012   sinus    Hx of bee sting allergy 07/23/2015   Normal cardiac stress test 05-24-2007   low risk scan   Past Surgical History:  Procedure Laterality Date   ATRIAL FIBRILLATION ABLATION     LAPAROSCOPIC APPENDECTOMY N/A 03/30/2017   Procedure: APPENDECTOMY LAPAROSCOPIC;  Surgeon: Armandina Gemma, MD;  Location: [facility];  Service: General;  Laterality: N/A;   left testicle removed (no cancer, benign mass)     othroscopic knee surgery bil     vericose veins stripped both legs     Allergies  Allergen Reactions   Bee Venom Anaphylaxis   Iodine Solution [Povidone Iodine]  Itching    Patient states his arm was irritated following a previous arm surgery where he had been scrubbed with betadine.   Current Meds  Medication Sig   brompheniramine-pseudoephedrine-DM 30-2-10 MG/5ML syrup Take 1.3 mLs by mouth 3 (three) times daily as needed.   Cholecalciferol (VITAMIN D) 50 MCG (2000 UT) CAPS Take by mouth.   citalopram (CELEXA) 10 MG tablet TAKE 1 TABLET BY MOUTH  DAILY   EPINEPHrine 0.3 mg/0.3 mL IJ SOAJ injection Inject 0.3 mLs (0.3 mg total) into the muscle as needed for anaphylaxis.   fish oil-omega-3 fatty acids 1000 MG capsule Take 1 g by mouth daily.   Glucosamine-Chondroitin (GLUCOSAMINE CHONDR COMPLEX PO) Take 1 tablet by mouth 2 (two) times daily.   levocetirizine (XYZAL) 5 MG tablet Take 5 mg by mouth every evening.   magnesium 30 MG tablet Take 30 mg by mouth daily.   Multiple Vitamin (MULITIVITAMIN WITH MINERALS) TABS Take 1 tablet by mouth daily.   propranolol (INDERAL) 20 MG tablet Take 1 tablet (20 mg total) by mouth 2 (two) times daily.   Tart Cherry 1200 MG CAPS Take by mouth.   Turmeric 500 MG TABS Take by mouth.   [DISCONTINUED] ciclopirox (PENLAC) 8 % solution Apply topically at bedtime. Apply over nail and surrounding skin. Apply daily over previous coat. After seven (7) days, may remove with alcohol and continue cycle.   Social History   Tobacco  Use   Smoking status: Former    Packs/day: 1.50    Years: 8.00    Pack years: 12.00    Types: Cigarettes    Quit date: 07/11/1977    Years since quitting: 43.8   Smokeless tobacco: Never  Substance Use Topics   Alcohol use: Yes    Alcohol/week: 21.0 standard drinks    Types: 21 Standard drinks or equivalent per week   Family History  Problem Relation Age of Onset   Alcohol abuse Mother    Healthy Mother    Alcohol abuse Father    COPD Father        IT trainer, smoker   Arthritis Maternal Grandmother    Cancer Maternal Grandmother        ?abdominal   Breast cancer Sister     Alzheimer's disease Maternal Grandfather    Heart attack Paternal Grandfather      Review of Systems  Constitutional:  Negative for activity change, appetite change, chills, fatigue, fever and unexpected weight change.  HENT:  Negative for congestion, ear pain, hearing loss, sinus pressure, sinus pain, sore throat and trouble swallowing.   Eyes:  Negative for pain and visual disturbance.  Respiratory:  Negative for cough, chest tightness, shortness of breath and wheezing.   Cardiovascular:  Negative for chest pain, palpitations and leg swelling.  Gastrointestinal:  Negative for abdominal distention, abdominal pain, blood in stool, constipation, diarrhea, nausea and vomiting.  Genitourinary:  Negative for decreased urine volume, difficulty urinating, dysuria, penile pain and testicular pain.  Musculoskeletal:  Negative for arthralgias, back pain and joint swelling.  Skin:  Negative for rash.  Neurological:  Negative for dizziness, weakness, numbness and headaches.  Hematological:  Negative for adenopathy. Does not bruise/bleed easily.  Psychiatric/Behavioral:  Negative for agitation, sleep disturbance and suicidal ideas. The patient is not nervous/anxious.    CBC:  Lab Results  Component Value Date   WBC 4.3 04/28/2021   HGB 16.2 04/28/2021   HCT 47.2 04/28/2021   MCH 31.3 09/06/2011   MCHC 34.4 04/28/2021   RDW 12.8 04/28/2021   PLT 168.0 04/28/2021   CMP: Lab Results  Component Value Date   NA 137 04/28/2021   K 4.5 04/28/2021   CL 100 04/28/2021   CO2 30 04/28/2021   GLUCOSE 88 04/28/2021   BUN 12 04/28/2021   CREATININE 1.06 04/28/2021   GFRAA >90 09/06/2011   CALCIUM 9.5 04/28/2021   PROT 7.0 04/28/2021   BILITOT 1.1 04/28/2021   ALKPHOS 55 04/28/2021   ALT 22 04/28/2021   AST 22 04/28/2021   LIPID: Lab Results  Component Value Date   CHOL 202 (H) 04/28/2021   TRIG 106.0 04/28/2021   HDL 60.70 04/28/2021   LDLCALC 120 (H) 04/28/2021    Objective:  BP  122/72 (BP Location: Right Arm, Patient Position: Sitting, Cuff Size: Large)    Pulse 71    Temp 98.5 F (36.9 C) (Oral)    Ht 6\' 7"  (2.007 m)    Wt 237 lb (107.5 kg)    SpO2 95%    BMI 26.70 kg/m   Weight: 237 lb (107.5 kg)   BP Readings from Last 3 Encounters:  04/28/21 122/72  03/24/21 116/78  03/23/21 112/70   Wt Readings from Last 3 Encounters:  04/28/21 237 lb (107.5 kg)  03/24/21 236 lb (107 kg)  03/23/21 234 lb (106.1 kg)    Physical Exam Constitutional:      General: He is not in acute distress.  Appearance: He is well-developed.  HENT:     Head: Normocephalic and atraumatic.     Right Ear: External ear normal.     Left Ear: External ear normal.     Nose: Nose normal.     Mouth/Throat:     Pharynx: No oropharyngeal exudate.  Eyes:     Conjunctiva/sclera: Conjunctivae normal.     Pupils: Pupils are equal, round, and reactive to light.  Neck:     Thyroid: No thyromegaly.  Cardiovascular:     Rate and Rhythm: Normal rate and regular rhythm.     Heart sounds: Normal heart sounds. No murmur heard.   No friction rub. No gallop.  Pulmonary:     Effort: Pulmonary effort is normal. No respiratory distress.     Breath sounds: Normal breath sounds. No stridor. No wheezing or rales.  Abdominal:     General: Bowel sounds are normal.     Palpations: Abdomen is soft.  Musculoskeletal:        General: Normal range of motion.     Cervical back: Neck supple.  Skin:    General: Skin is warm and dry.     Comments: Thickened, yellow great toenails bilat; there is about 32mm healthy nail growth from cuticle.  Neurological:     Mental Status: He is alert and oriented to person, place, and time.     Motor: Tremor present. No weakness or pronator drift.     Coordination: Romberg sign negative. Finger-Nose-Finger Test and Heel to [facility] Test normal. Rapid alternating movements normal.     Deep Tendon Reflexes:     Reflex Scores:      Bicep reflexes are 2+ on the right side and 2+  on the left side.      Brachioradialis reflexes are 2+ on the right side and 2+ on the left side.      Patellar reflexes are 2+ on the right side and 2+ on the left side. Psychiatric:        Behavior: Behavior normal.        Thought Content: Thought content normal.        Judgment: Judgment normal.    Assessment/Plan: There are no preventive care reminders to display for this patient.  Health Maintenance reviewed.  1. Preventative health care Keep up with regular activity, exercise.  - PSA; Future - PSA  2. Need for pneumococcal vaccination - Pneumococcal conjugate vaccine 20-valent (Prevnar 20)  3. Memory impairment Normal montreal cog exam today. Will check some bloodwork. Can continue to monitor, but I suspect more stimulus related. - TSH; Future - TSH  4. Tremor Neuro visit data is old enough that I cannot access this, but we are going to try and treat tremor with propranolol to see if this helps to slow it down. Further eval pending bloodwork/response.  - Magnesium, RBC; Future - Magnesium, RBC  5. Onychomycosis File top of nail and continue with penlac application. He prefers to monitor and will let me know if he prefers to try different treatment method (like laser at podiatry)  6. Skin lesions - Ambulatory referral to Dermatology  7. Lipid screening - Comprehensive metabolic panel; Future - Lipid panel; Future - Lipid panel - Comprehensive metabolic panel  8. Screening for diabetes mellitus - CBC with Differential/Platelet; Future - CBC with Differential/Platelet  Return for pending bloodwork.  Micheline Rough, MD

## 2021-04-30 LAB — MAGNESIUM, RBC: Magnesium RBC: 4.9 mg/dL (ref 4.0–6.4)

## 2021-05-05 ENCOUNTER — Other Ambulatory Visit: Payer: Self-pay | Admitting: Family Medicine

## 2021-05-05 DIAGNOSIS — E785 Hyperlipidemia, unspecified: Secondary | ICD-10-CM

## 2021-05-10 ENCOUNTER — Other Ambulatory Visit (HOSPITAL_BASED_OUTPATIENT_CLINIC_OR_DEPARTMENT_OTHER): Payer: Self-pay

## 2021-05-10 MED ORDER — ZOSTER VAC RECOMB ADJUVANTED 50 MCG/0.5ML IM SUSR
INTRAMUSCULAR | 1 refills | Status: DC
  Filled 2021-05-10: qty 0.5, 1d supply, fill #0
  Filled 2021-07-30: qty 0.5, 1d supply, fill #1

## 2021-05-22 ENCOUNTER — Other Ambulatory Visit: Payer: Self-pay | Admitting: Family Medicine

## 2021-06-09 DIAGNOSIS — H2513 Age-related nuclear cataract, bilateral: Secondary | ICD-10-CM | POA: Diagnosis not present

## 2021-07-24 ENCOUNTER — Other Ambulatory Visit: Payer: Self-pay | Admitting: Family Medicine

## 2021-07-30 ENCOUNTER — Other Ambulatory Visit (HOSPITAL_BASED_OUTPATIENT_CLINIC_OR_DEPARTMENT_OTHER): Payer: Self-pay

## 2021-07-30 MED ORDER — ZOSTER VAC RECOMB ADJUVANTED 50 MCG/0.5ML IM SUSR
INTRAMUSCULAR | 0 refills | Status: DC
  Filled 2021-07-30: qty 0.5, 1d supply, fill #0

## 2021-08-10 ENCOUNTER — Other Ambulatory Visit: Payer: Self-pay | Admitting: *Deleted

## 2021-08-10 MED ORDER — PROPRANOLOL HCL 20 MG PO TABS: 20.0000 mg | ORAL_TABLET | Freq: Two times a day (BID) | ORAL | 1 refills | Status: DC

## 2021-08-10 NOTE — Telephone Encounter (Signed)
Rx done. 

## 2021-08-16 ENCOUNTER — Ambulatory Visit (INDEPENDENT_AMBULATORY_CARE_PROVIDER_SITE_OTHER): Payer: Medicare Other | Admitting: Family Medicine

## 2021-08-16 ENCOUNTER — Encounter: Payer: Self-pay | Admitting: Family Medicine

## 2021-08-16 VITALS — BP 98/60 | HR 70 | Temp 98.1°F | Ht 79.0 in | Wt 239.8 lb

## 2021-08-16 DIAGNOSIS — R251 Tremor, unspecified: Secondary | ICD-10-CM | POA: Diagnosis not present

## 2021-08-16 DIAGNOSIS — F411 Generalized anxiety disorder: Secondary | ICD-10-CM

## 2021-08-16 DIAGNOSIS — R059 Cough, unspecified: Secondary | ICD-10-CM

## 2021-08-16 MED ORDER — PROPRANOLOL HCL 20 MG PO TABS: 20.0000 mg | ORAL_TABLET | Freq: Two times a day (BID) | ORAL | 3 refills | Status: DC

## 2021-08-16 MED ORDER — CITALOPRAM HYDROBROMIDE 20 MG PO TABS: 10.0000 mg | ORAL_TABLET | Freq: Every day | ORAL | 3 refills | Status: DC

## 2021-08-16 MED ORDER — HYDROCOD POLI-CHLORPHE POLI ER 10-8 MG/5ML PO SUER: 5.0000 mL | Freq: Two times a day (BID) | ORAL | 0 refills | Status: DC | PRN

## 2021-08-16 MED ORDER — DOXYCYCLINE HYCLATE 100 MG PO TABS: 100.0000 mg | ORAL_TABLET | Freq: Two times a day (BID) | ORAL | 0 refills | Status: AC
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
## 2021-08-18 MED ORDER — CITALOPRAM HYDROBROMIDE 20 MG PO TABS: 20.0000 mg | ORAL_TABLET | Freq: Every day | ORAL | 3 refills | Status: DC

## 2021-09-16 NOTE — Progress Notes (Signed)
Jeffrey [name] [address] [place] Phone: [phone] Subjective:   [name], am serving as a scribe for Dr. Hulan Saas.  I'm seeing this patient by the request  of:  Koberlein, Steele Berg, MD  CC: left foot pain   [id number]  03/23/2021 Patient is no longer having any significant discomfort or pain at this time.  Discussed with patient icing regimen and home exercises.  Worsening pain can consider the injection but patient has responded extremely well to formal physical therapy.  Follow-up with me again as needed  Updaterd 09/21/2021 Jeffrey [name] is a 66 y.o. male coming in with complaint of left foot pain and left wrist pain. Foot pain started about 3 weeks ago. Has been doing a lot of walking. No pain in the morning. The more walking the worse it gets. Ibuprofen has helped a little. MTP joint of first toe to medial malleolus on the top and bottom. Wrist pain causes burning sensation in forearm. Can feel it more with wrist extension.  Recently seen for tremor in April but improved with propranolol      Past Medical History:  Diagnosis Date   Abnormal Doppler ultrasound of carotid artery 07-06-2012   normal carotid doppler   Acute appendicitis 03/30/2017   AF (atrial fibrillation) (HCC)    Appendicitis, acute 03/30/2017   Chronic knee pain 07/23/2015   H/O echocardiogram 06-26-2012   Transthoracic echo    EF 55-60%   H/O prior ablation treatment 2011   History of atrial flutter    History of cardiac monitoring 06-15-2012   sinus    Hx of bee sting allergy 07/23/2015   Normal cardiac stress test 05-24-2007   low risk scan   Past Surgical History:  Procedure Laterality Date   ATRIAL FIBRILLATION ABLATION     LAPAROSCOPIC APPENDECTOMY N/A 03/30/2017   Procedure: APPENDECTOMY LAPAROSCOPIC;  Surgeon: Armandina Gemma, MD;  Location: [facility];  Service: General;  Laterality: N/A;   left testicle removed (no cancer, benign mass)      othroscopic knee surgery bil     vericose veins stripped both legs     Social History   Socioeconomic History   Marital status: Married    Spouse name: Not on file   Number of children: Not on file   Years of education: Not on file   Highest education level: Associate degree: occupational, Hotel manager, or vocational program  Occupational History   Occupation: Freight forwarder  Tobacco Use   Smoking status: Former    Packs/day: 1.50    Years: 8.00    Pack years: 12.00    Types: Cigarettes    Quit date: 07/11/1977    Years since quitting: 44.2   Smokeless tobacco: Never  Substance and Sexual Activity   Alcohol use: Yes    Alcohol/week: 21.0 standard drinks    Types: 21 Standard drinks or equivalent per week   Drug use: No   Sexual activity: Not on file  Other Topics Concern   Not on file  Social History Narrative   Work or School: [employer], 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      Home Situation: lives with wife      Spiritual Beliefs: none      Lifestyle: gym 3 days per week; diet is healthy      Social Determinants of Radio broadcast assistant Strain: Low Risk    Difficulty of Paying Living Expenses: Not hard at all  Food Insecurity: No  Food Insecurity   Worried About Charity fundraiser in the Last Year: Never true   Ran Out of Food in the Last Year: Never true  Transportation Needs: No Transportation Needs   Lack of Transportation (Medical): No   Lack of Transportation (Non-Medical): No  Physical Activity: Sufficiently Active   Days of Exercise per Week: 4 days   Minutes of Exercise per Session: 60 min  Stress: No Stress Concern Present   Feeling of Stress : Only a little  Social Connections: Moderately Isolated   Frequency of Communication with Friends and Family: Once a week   Frequency of Social Gatherings with Friends and Family: Once a week   Attends Religious Services: Never   Marine scientist or Organizations: No   Attends Music therapist: More than 4 times  per year   Marital Status: Married   Allergies  Allergen Reactions   Bee Venom Anaphylaxis   Iodine Solution [Povidone Iodine] Itching    Patient states his arm was irritated following a previous arm surgery where he had been scrubbed with betadine.   Family History  Problem Relation Age of Onset   Alcohol abuse Mother    Healthy Mother    Alcohol abuse Father    COPD Father        IT trainer, smoker   Arthritis Maternal Grandmother    Cancer Maternal Grandmother        ?abdominal   Breast cancer Sister    Alzheimer's disease Maternal Grandfather    Heart attack Paternal Grandfather      Current Outpatient Medications (Cardiovascular):    EPINEPHrine 0.3 mg/0.3 mL IJ SOAJ injection, Inject 0.3 mLs (0.3 mg total) into the muscle as needed for anaphylaxis.   propranolol (INDERAL) 20 MG tablet, Take 1 tablet (20 mg total) by mouth 2 (two) times daily.  Current Outpatient Medications (Respiratory):    chlorpheniramine-HYDROcodone (TUSSIONEX PENNKINETIC ER) 10-8 MG/5ML, Take 5 mLs by mouth every 12 (twelve) hours as needed for cough.   levocetirizine (XYZAL) 5 MG tablet, Take 5 mg by mouth every evening.    Current Outpatient Medications (Other):    Cholecalciferol (VITAMIN D) 50 MCG (2000 UT) CAPS, Take by mouth.   ciclopirox (PENLAC) 8 % solution, Apply topically at bedtime. Apply over nail and surrounding skin. Apply daily over previous coat. After seven (7) days, may remove with alcohol and continue cycle.   citalopram (CELEXA) 20 MG tablet, Take 1 tablet (20 mg total) by mouth daily.   fish oil-omega-3 fatty acids 1000 MG capsule, Take 1 g by mouth daily.   Glucosamine-Chondroitin (GLUCOSAMINE CHONDR COMPLEX PO), Take 1 tablet by mouth 2 (two) times daily.   magnesium 30 MG tablet, Take 30 mg by mouth daily.   Multiple Vitamin (MULITIVITAMIN WITH MINERALS) TABS, Take 1 tablet by mouth daily.   Tart Cherry 1200 MG CAPS, Take by mouth.   Turmeric 500 MG TABS, Take by  mouth.   Zoster Vaccine Adjuvanted [facility]) injection, Inject into the muscle.   Zoster Vaccine Adjuvanted [facility]) injection, Inject into the muscle.   Reviewed prior external information including notes and imaging from  primary care provider As well as notes that were available from care everywhere and other healthcare systems.  Past medical history, social, surgical and family history all reviewed in electronic medical record.  No pertanent information unless stated regarding to the chief complaint.   Review of Systems:  No headache, visual changes, nausea, vomiting, diarrhea, constipation, dizziness, abdominal  pain, skin rash, fevers, chills, night sweats, weight loss, swollen lymph nodes, body aches, joint swelling, chest pain, shortness of breath, mood changes. POSITIVE muscle aches  Objective  Blood pressure 128/84, pulse 72, height '6\' 7"'$  (2.007 m), weight 240 lb (108.9 kg), SpO2 97 %.   General: No apparent distress alert and oriented x3 mood and affect normal, dressed appropriately.  HEENT: Pupils equal, extraocular movements intact  Respiratory: Patient's speak in full sentences and does not appear short of breath  Cardiovascular: No lower extremity edema, non tender, no erythema  Gait normal with good balance and coordination.  MSK: Left foot exam shows the patient does have breakdown of the transverse arch noted.  Some limited motion noted of the first MTP.  Seems to be more hallux limitus than rigidus.  Patient does have very rigid midfoot noted.  Some mild pain over the midfoot.  Left wrist exam shows the patient does have some mild discomfort noted more in the forearm with resisted extension.  Good grip strength noted.  Neurovascular intact distally.  Mild discomfort noted over the  Limited muscular skeletal ultrasound was performed and interpreted by Hulan Saas, M  Limited ultrasound of patient's left foot shows the patient does have what appears to be a bone spur  noted over the MTP of the first toe.  Patient does have trace effusion noted as well.  In addition to this patient's midfoot has what appears to be a superficial and calcific changes noted.  Does not appear to be connected but could be an avulsion from a bone spur.  Narrowing noted of the midfoot. Impression: Midfoot arthritis with first toe effusion   Impression and Recommendations:     The above documentation has been reviewed and is accurate and complete Lyndal Pulley, DO

## 2021-09-21 ENCOUNTER — Ambulatory Visit (INDEPENDENT_AMBULATORY_CARE_PROVIDER_SITE_OTHER): Payer: Medicare Other

## 2021-09-21 ENCOUNTER — Encounter: Payer: Self-pay | Admitting: Family Medicine

## 2021-09-21 ENCOUNTER — Ambulatory Visit: Payer: Medicare Other

## 2021-09-21 ENCOUNTER — Ambulatory Visit: Payer: Medicare Other | Admitting: Family Medicine

## 2021-09-21 VITALS — BP 128/84 | HR 72 | Ht 79.0 in | Wt 240.0 lb

## 2021-09-21 DIAGNOSIS — M19072 Primary osteoarthritis, left ankle and foot: Secondary | ICD-10-CM | POA: Insufficient documentation

## 2021-09-21 DIAGNOSIS — M79672 Pain in left foot: Secondary | ICD-10-CM

## 2021-09-21 DIAGNOSIS — M7712 Lateral epicondylitis, left elbow: Secondary | ICD-10-CM

## 2021-09-21 NOTE — Patient Instructions (Addendum)
Xray today Voltaren and Ice Rocker bottom shoes Hoka or Gravity Defyer Carbon fiber plate off [employer] Recovery sandals in house Hoka or Oofos Do prescribed exercises at least 3x a week See you again in 6-8 weeks

## 2021-09-21 NOTE — Assessment & Plan Note (Signed)
Elbow anatomy was reviewed, and tendinopathy was explained.  Pt. given a home rehab program. Start with isometrics and ROM, then a series of concentric and eccentric exercises should be done starting with no weight, work up to 1 lb, hammer, etc.  Use counterforce strap if working or using hands.  Formal PT would be beneficial. Emphasized stretching an cross-friction massage Emphasized proper palms up lifting biomechanics to unload ECRB  

## 2021-09-21 NOTE — Assessment & Plan Note (Signed)
Arthritis noted of this as well as the midfoot.  Discussed more rigid soled shoes.  Discussed a carbon fiber plate, discussed avoiding going barefoot.  Topical anti-inflammatories and icing regimen.  Worsening pain will consider injection and potentially custom orthotics.

## 2021-10-01 DIAGNOSIS — L918 Other hypertrophic disorders of the skin: Secondary | ICD-10-CM | POA: Diagnosis not present

## 2021-10-01 DIAGNOSIS — L723 Sebaceous cyst: Secondary | ICD-10-CM | POA: Diagnosis not present

## 2021-10-01 DIAGNOSIS — L738 Other specified follicular disorders: Secondary | ICD-10-CM | POA: Diagnosis not present

## 2021-10-01 DIAGNOSIS — D225 Melanocytic nevi of trunk: Secondary | ICD-10-CM | POA: Diagnosis not present

## 2021-10-01 DIAGNOSIS — L821 Other seborrheic keratosis: Secondary | ICD-10-CM | POA: Diagnosis not present

## 2021-10-01 DIAGNOSIS — B354 Tinea corporis: Secondary | ICD-10-CM | POA: Diagnosis not present

## 2021-10-01 DIAGNOSIS — L570 Actinic keratosis: Secondary | ICD-10-CM | POA: Diagnosis not present

## 2021-10-08 ENCOUNTER — Ambulatory Visit: Payer: Medicare Other | Admitting: Family Medicine

## 2021-10-21 IMAGING — DX DG CHEST 2V
2 series · 3 of 3 positions shown · non-contrast
Comparison: 01/28/2016

CLINICAL DATA: Right side chest pain.

EXAM:
CHEST - 2 VIEW

[Series 1: chest pa · 0.14mm/px · 2 of 2 slices shown]
[im 1/2]
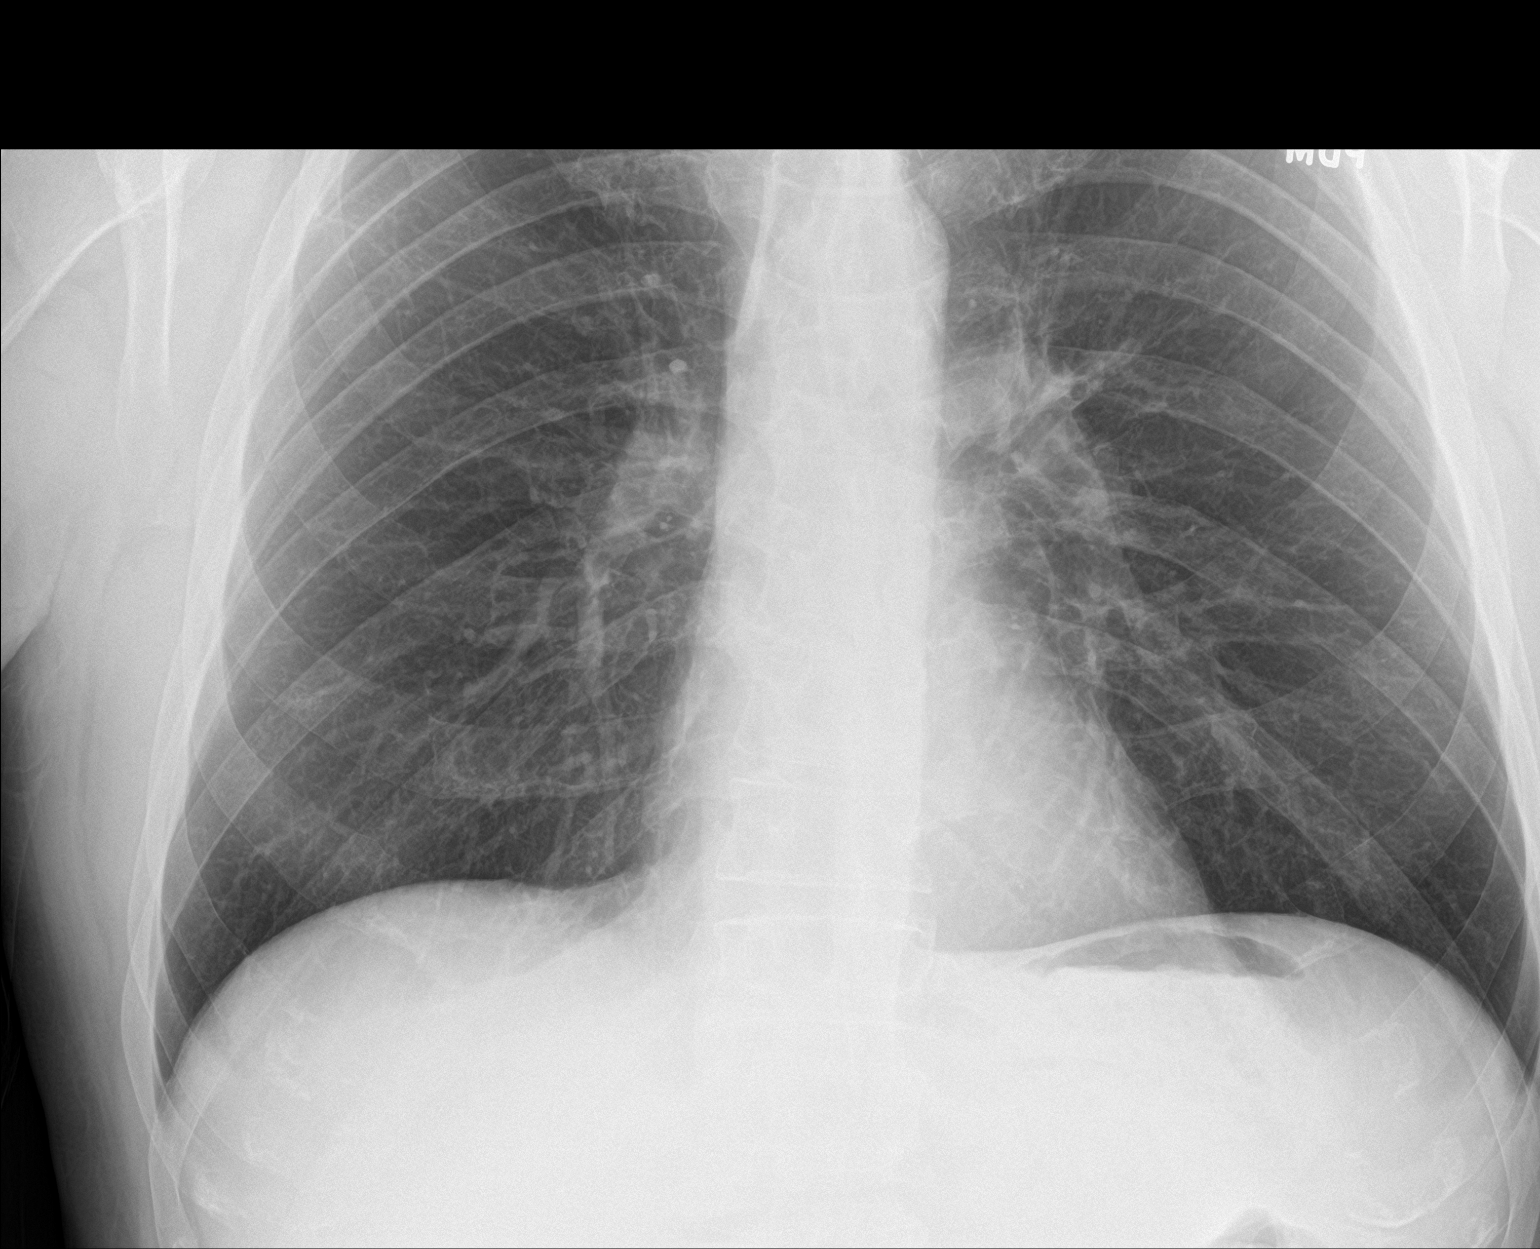
[im 2/2]
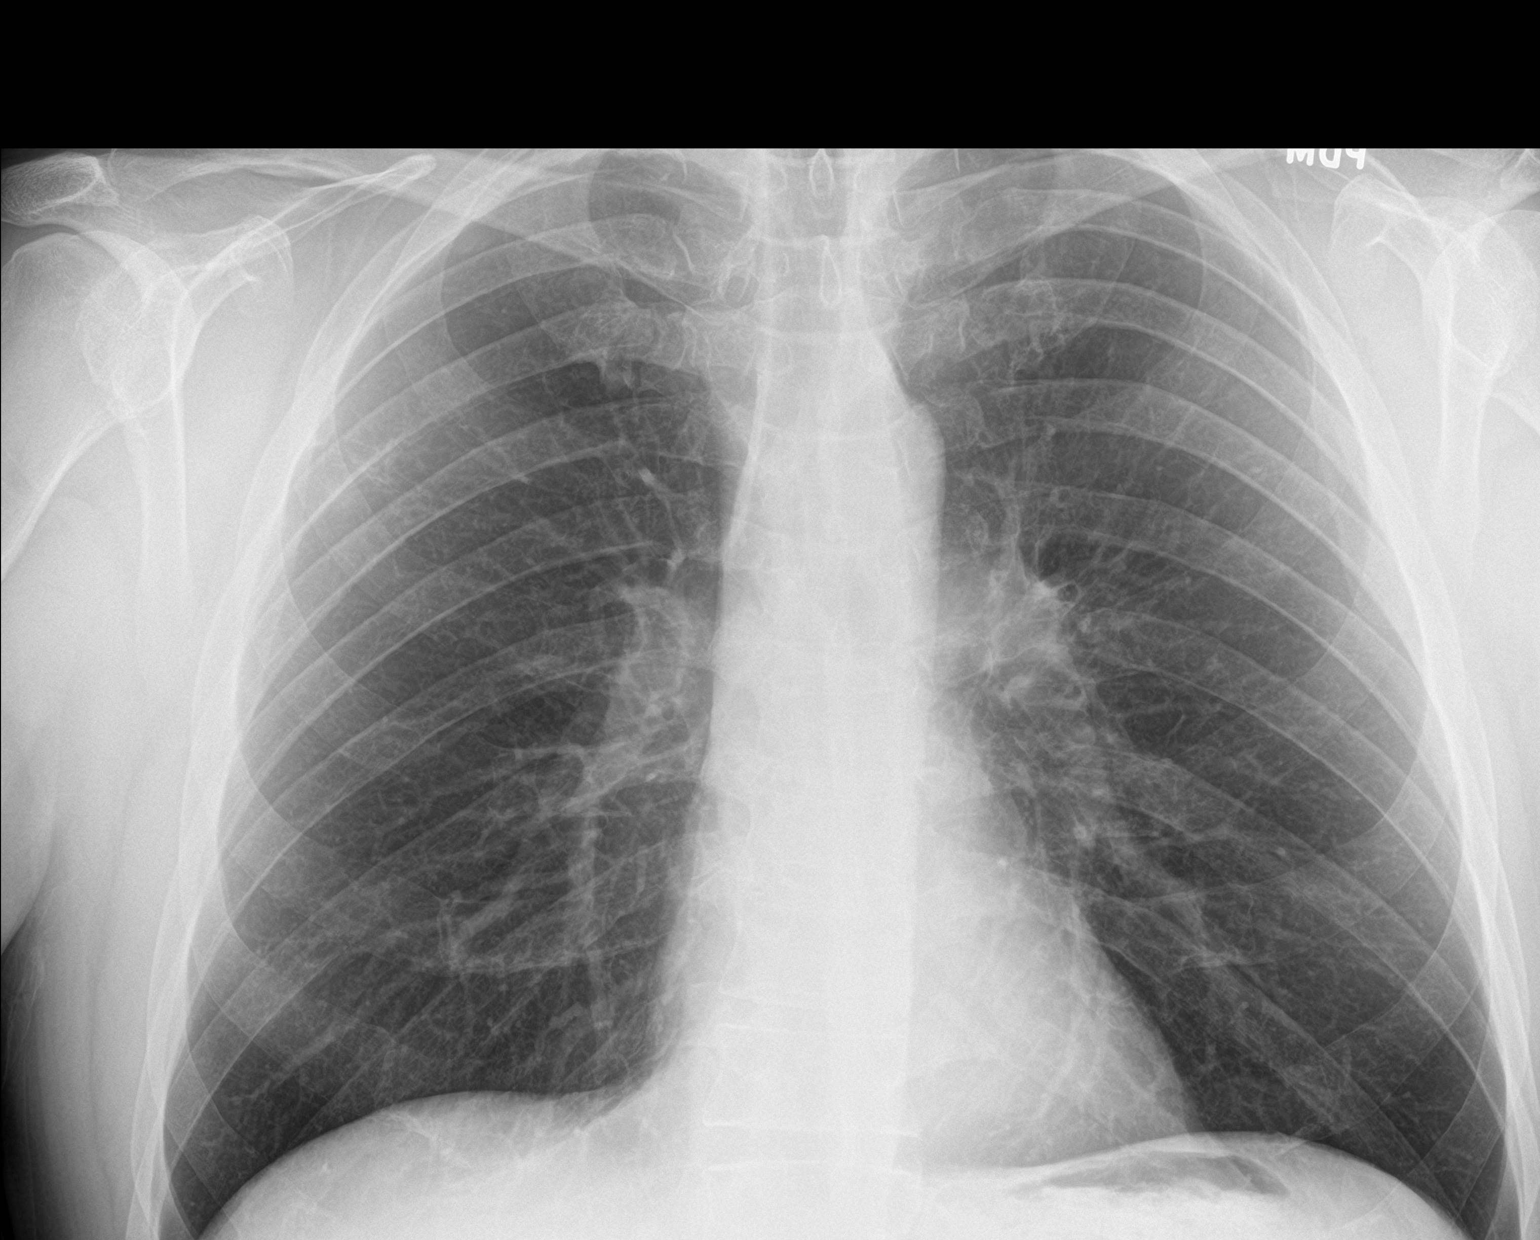

[chest lat]
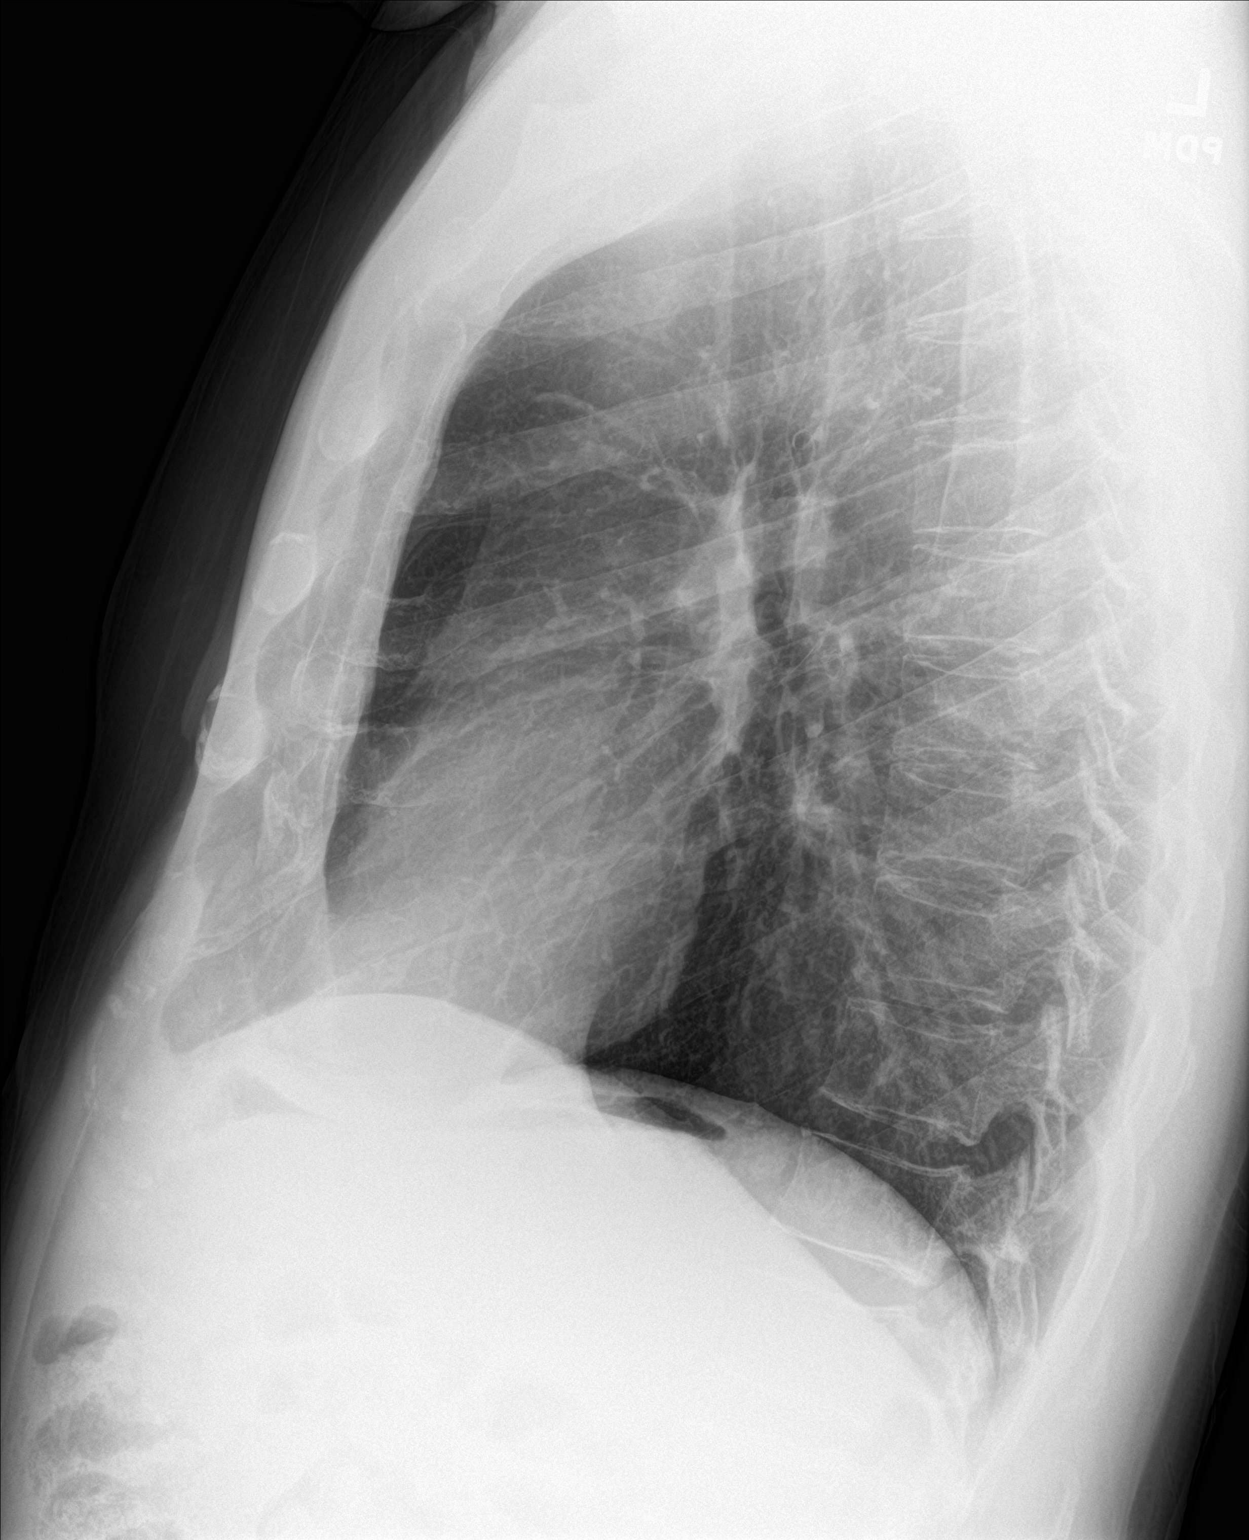

[3 of 3 positions shown; findings below may reference images not displayed]

FINDINGS: The heart size and mediastinal contours are within normal limits.
Both lungs are clear. The visualized skeletal structures are
unremarkable.
IMPRESSION: No active cardiopulmonary disease.

## 2021-10-28 NOTE — Progress Notes (Unsigned)
Jeffrey [name] [address] [place] Phone: [phone] Subjective:   [name], am serving as a scribe for Dr. Hulan Saas.  I'm seeing this patient by the request  of:  Caren Macadam, MD (Inactive)  CC: left foot pain   EPP:IRJJOACZYS  09/21/2021 Elbow anatomy was reviewed, and tendinopathy was explained.   Pt. given a home rehab program. Start with isometrics and ROM, then a series of concentric and eccentric exercises should be done starting with no weight, work up to 1 lb, hammer, etc.   Use counterforce strap if working or using hands.   Formal PT would be beneficial. Emphasized stretching an cross-friction massage Emphasized proper palms up lifting biomechanics to unload ECRB  Arthritis noted of this as well as the midfoot.  Discussed more rigid soled shoes.  Discussed a carbon fiber plate, discussed avoiding going barefoot.  Topical anti-inflammatories and icing regimen.  Worsening pain will consider injection and potentially custom orthotics.  Updated 11/03/2021 Jeffrey [name] is a 66 y.o. male coming in with complaint of left toe and elbow pain. Elbow is doing okay, no pain. Left foot is getting better, but the days that he is really active still painful. The change in shoes has helped. Going in the right direction.  Xray IMPRESSION: Mild hallux valgus with mild great toe metatarsophalangeal osteoarthritis.     Past Medical History:  Diagnosis Date   Abnormal Doppler ultrasound of carotid artery 07-06-2012   normal carotid doppler   Acute appendicitis 03/30/2017   AF (atrial fibrillation) (HCC)    Appendicitis, acute 03/30/2017   Chronic knee pain 07/23/2015   H/O echocardiogram 06-26-2012   Transthoracic echo    EF 55-60%   H/O prior ablation treatment 2011   History of atrial flutter    History of cardiac monitoring 06-15-2012   sinus    Hx of bee sting allergy 07/23/2015   Normal cardiac stress test  05-24-2007   low risk scan   Past Surgical History:  Procedure Laterality Date   ATRIAL FIBRILLATION ABLATION     LAPAROSCOPIC APPENDECTOMY N/A 03/30/2017   Procedure: APPENDECTOMY LAPAROSCOPIC;  Surgeon: Armandina Gemma, MD;  Location: [facility];  Service: General;  Laterality: N/A;   left testicle removed (no cancer, benign mass)     othroscopic knee surgery bil     vericose veins stripped both legs     Social History   Socioeconomic History   Marital status: Married    Spouse name: Not on file   Number of children: Not on file   Years of education: Not on file   Highest education level: Associate degree: occupational, Hotel manager, or vocational program  Occupational History   Occupation: Freight forwarder  Tobacco Use   Smoking status: Former    Packs/day: 1.50    Years: 8.00    Total pack years: 12.00    Types: Cigarettes    Quit date: 07/11/1977    Years since quitting: 44.3   Smokeless tobacco: Never  Substance and Sexual Activity   Alcohol use: Yes    Alcohol/week: 21.0 standard drinks of alcohol    Types: 21 Standard drinks or equivalent per week   Drug use: No   Sexual activity: Not on file  Other Topics Concern   Not on file  Social History Narrative   Work or School: [employer], 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      Home Situation: lives with wife      Spiritual Beliefs: none  Lifestyle: gym 3 days per week; diet is healthy      Social Determinants of Health   Financial Resource Strain: Low Risk  (03/22/2021)   Overall Financial Resource Strain (CARDIA)    Difficulty of Paying Living Expenses: Not hard at all  Food Insecurity: No Food Insecurity (03/22/2021)   Hunger Vital Sign    Worried About Running Out of Food in the Last Year: Never true    Ran Out of Food in the Last Year: Never true  Transportation Needs: No Transportation Needs (03/22/2021)   PRAPARE - Hydrologist (Medical): No    Lack of Transportation (Non-Medical): No  Physical Activity:  Sufficiently Active (03/22/2021)   Exercise Vital Sign    Days of Exercise per Week: 4 days    Minutes of Exercise per Session: 60 min  Stress: No Stress Concern Present (03/22/2021)   [place]    Feeling of Stress : Only a little  Social Connections: Socially Isolated (03/22/2021)   Social Connection and Isolation Panel [NHANES]    Frequency of Communication with Friends and Family: Once a week    Frequency of Social Gatherings with Friends and Family: Once a week    Attends Religious Services: Never    Marine scientist or Organizations: No    Attends Music therapist: Not on file    Marital Status: Married   Allergies  Allergen Reactions   Bee Venom Anaphylaxis   Iodine Solution [Povidone Iodine] Itching    Patient states his arm was irritated following a previous arm surgery where he had been scrubbed with betadine.   Family History  Problem Relation Age of Onset   Alcohol abuse Mother    Healthy Mother    Alcohol abuse Father    COPD Father        IT trainer, smoker   Arthritis Maternal Grandmother    Cancer Maternal Grandmother        ?abdominal   Breast cancer Sister    Alzheimer's disease Maternal Grandfather    Heart attack Paternal Grandfather      Current Outpatient Medications (Cardiovascular):    EPINEPHrine 0.3 mg/0.3 mL IJ SOAJ injection, Inject 0.3 mLs (0.3 mg total) into the muscle as needed for anaphylaxis.   propranolol (INDERAL) 20 MG tablet, Take 1 tablet (20 mg total) by mouth 2 (two) times daily.  Current Outpatient Medications (Respiratory):    chlorpheniramine-HYDROcodone (TUSSIONEX PENNKINETIC ER) 10-8 MG/5ML, Take 5 mLs by mouth every 12 (twelve) hours as needed for cough.   levocetirizine (XYZAL) 5 MG tablet, Take 5 mg by mouth every evening.    Current Outpatient Medications (Other):    Cholecalciferol (VITAMIN D) 50 MCG (2000 UT) CAPS, Take by mouth.    ciclopirox (PENLAC) 8 % solution, Apply topically at bedtime. Apply over nail and surrounding skin. Apply daily over previous coat. After seven (7) days, may remove with alcohol and continue cycle.   citalopram (CELEXA) 20 MG tablet, Take 1 tablet (20 mg total) by mouth daily.   fish oil-omega-3 fatty acids 1000 MG capsule, Take 1 g by mouth daily.   Glucosamine-Chondroitin (GLUCOSAMINE CHONDR COMPLEX PO), Take 1 tablet by mouth 2 (two) times daily.   magnesium 30 MG tablet, Take 30 mg by mouth daily.   Multiple Vitamin (MULITIVITAMIN WITH MINERALS) TABS, Take 1 tablet by mouth daily.   Tart Cherry 1200 MG CAPS, Take by mouth.   Turmeric  500 MG TABS, Take by mouth.   Zoster Vaccine Adjuvanted [facility]) injection, Inject into the muscle.   Zoster Vaccine Adjuvanted [facility]) injection, Inject into the muscle.   Reviewed prior external information including notes and imaging from  primary care provider As well as notes that were available from care everywhere and other healthcare systems.  Past medical history, social, surgical and family history all reviewed in electronic medical record.  No pertanent information unless stated regarding to the chief complaint.   Review of Systems:  No headache, visual changes, nausea, vomiting, diarrhea, constipation, dizziness, abdominal pain, skin rash, fevers, chills, night sweats, weight loss, swollen lymph nodes, body aches, joint swelling, chest pain, shortness of breath, mood changes. POSITIVE muscle aches  Objective  There were no vitals taken for this visit.   General: No apparent distress alert and oriented x3 mood and affect normal, dressed appropriately.  HEENT: Pupils equal, extraocular movements intact  Respiratory: Patient's speak in full sentences and does not appear short of breath  Cardiovascular: No lower extremity edema, non tender, no erythema      Impression and Recommendations:    The above documentation has been reviewed and  is accurate and complete Lyndal Pulley, DO

## 2021-11-03 ENCOUNTER — Ambulatory Visit (INDEPENDENT_AMBULATORY_CARE_PROVIDER_SITE_OTHER): Payer: Medicare Other | Admitting: Family Medicine

## 2021-11-03 ENCOUNTER — Ambulatory Visit: Payer: Self-pay

## 2021-11-03 VITALS — BP 116/68 | HR 70 | Ht 79.0 in | Wt 230.0 lb

## 2021-11-03 DIAGNOSIS — M79672 Pain in left foot: Secondary | ICD-10-CM | POA: Diagnosis not present

## 2021-11-03 DIAGNOSIS — M19072 Primary osteoarthritis, left ankle and foot: Secondary | ICD-10-CM

## 2021-11-03 NOTE — Patient Instructions (Signed)
Ultra hiking boots See me again in 2 months if not perfect

## 2021-11-03 NOTE — Assessment & Plan Note (Signed)
Continues to have arthritis but significant decrease in the swelling.  Discussed proper shoes, work boots, rcovery sandals, worsening pain see me otherwise follow up as needed.

## 2021-11-04 ENCOUNTER — Ambulatory Visit (INDEPENDENT_AMBULATORY_CARE_PROVIDER_SITE_OTHER): Payer: Medicare Other

## 2021-11-04 ENCOUNTER — Encounter: Payer: Self-pay | Admitting: Family Medicine

## 2021-11-04 VITALS — Ht 78.0 in | Wt 230.0 lb

## 2021-11-04 DIAGNOSIS — Z0000 Encounter for general adult medical examination without abnormal findings: Secondary | ICD-10-CM | POA: Diagnosis not present

## 2021-11-04 NOTE — Patient Instructions (Addendum)
Jeffrey [name] , Thank you for taking time to come for your Medicare Wellness Visit. I appreciate your ongoing commitment to your health goals. Please review the following plan we discussed and let me know if I can assist you in the future.   These are the goals we discussed:  Goals       No current goals (pt-stated)        This is a list of the screening recommended for you and due dates:  Health Maintenance  Topic Date Due   Zoster (Shingles) Vaccine (2 of 2) 07/05/2021   COVID-19 Vaccine (5 - Booster for Pfizer series) 11/20/2021*   Flu Shot  11/23/2021   Colon Cancer Screening  09/03/2023   Tetanus Vaccine  04/26/2027   Pneumonia Vaccine  Completed   Hepatitis C Screening: USPSTF Recommendation to screen - Ages 18-79 yo.  Completed   HPV Vaccine  Aged Out  *Topic was postponed. The date shown is not the original due date.    Advanced directives: Yes  Conditions/risks identified: None  Next appointment: Follow up in one year for your annual wellness visit.    Preventive Care 75 Years and Older, Male Preventive care refers to lifestyle choices and visits with your health care provider that can promote health and wellness. What does preventive care include? A yearly physical exam. This is also called an annual well check. Dental exams once or twice a year. Routine eye exams. Ask your health care provider how often you should have your eyes checked. Personal lifestyle choices, including: Daily care of your teeth and gums. Regular physical activity. Eating a healthy diet. Avoiding tobacco and drug use. Limiting alcohol use. Practicing safe sex. Taking low doses of aspirin every day. Taking vitamin and mineral supplements as recommended by your health care provider. What happens during an annual well check? The services and screenings done by your health care provider during your annual well check will depend on your age, overall health, lifestyle risk factors, and family  history of disease. Counseling  Your health care provider may ask you questions about your: Alcohol use. Tobacco use. Drug use. Emotional well-being. Home and relationship well-being. Sexual activity. Eating habits. History of falls. Memory and ability to understand (cognition). Work and work Statistician. Screening  You may have the following tests or measurements: Height, weight, and BMI. Blood pressure. Lipid and cholesterol levels. These may be checked every 5 years, or more frequently if you are over 68 years old. Skin check. Lung cancer screening. You may have this screening every year starting at age 68 if you have a 30-pack-year history of smoking and currently smoke or have quit within the past 15 years. Fecal occult blood test (FOBT) of the stool. You may have this test every year starting at age 68. Flexible sigmoidoscopy or colonoscopy. You may have a sigmoidoscopy every 5 years or a colonoscopy every 10 years starting at age 68. Prostate cancer screening. Recommendations will vary depending on your family history and other risks. Hepatitis C blood test. Hepatitis B blood test. Sexually transmitted disease (STD) testing. Diabetes screening. This is done by checking your blood sugar (glucose) after you have not eaten for a while (fasting). You may have this done every 1-3 years. Abdominal aortic aneurysm (AAA) screening. You may need this if you are a current or former smoker. Osteoporosis. You may be screened starting at age 68 if you are at high risk. Talk with your health care provider about your test results, treatment  options, and if necessary, the need for more tests. Vaccines  Your health care provider may recommend certain vaccines, such as: Influenza vaccine. This is recommended every year. Tetanus, diphtheria, and acellular pertussis (Tdap, Td) vaccine. You may need a Td booster every 10 years. Zoster vaccine. You may need this after age 72. Pneumococcal  13-valent conjugate (PCV13) vaccine. One dose is recommended after age 68. Pneumococcal polysaccharide (PPSV23) vaccine. One dose is recommended after age 68. Talk to your health care provider about which screenings and vaccines you need and how often you need them. This information is not intended to replace advice given to you by your health care provider. Make sure you discuss any questions you have with your health care provider. Document Released: 05/08/2015 Document Revised: 12/30/2015 Document Reviewed: 02/10/2015 Elsevier Interactive Patient Education  2017 [place] Prevention in the Home Falls can cause injuries. They can happen to people of all ages. There are many things you can do to make your home safe and to help prevent falls. What can I do on the outside of my home? Regularly fix the edges of walkways and driveways and fix any cracks. Remove anything that might make you trip as you walk through a door, such as a raised step or threshold. Trim any bushes or trees on the path to your home. Use bright outdoor lighting. Clear any walking paths of anything that might make someone trip, such as rocks or tools. Regularly check to see if handrails are loose or broken. Make sure that both sides of any steps have handrails. Any raised decks and porches should have guardrails on the edges. Have any leaves, snow, or ice cleared regularly. Use sand or salt on walking paths during winter. Clean up any spills in your garage right away. This includes oil or grease spills. What can I do in the bathroom? Use night lights. Install grab bars by the toilet and in the tub and shower. Do not use towel bars as grab bars. Use non-skid mats or decals in the tub or shower. If you need to sit down in the shower, use a plastic, non-slip stool. Keep the floor dry. Clean up any water that spills on the floor as soon as it happens. Remove soap buildup in the tub or shower regularly. Attach  bath mats securely with double-sided non-slip rug tape. Do not have throw rugs and other things on the floor that can make you trip. What can I do in the bedroom? Use night lights. Make sure that you have a light by your bed that is easy to reach. Do not use any sheets or blankets that are too big for your bed. They should not hang down onto the floor. Have a firm chair that has side arms. You can use this for support while you get dressed. Do not have throw rugs and other things on the floor that can make you trip. What can I do in the kitchen? Clean up any spills right away. Avoid walking on wet floors. Keep items that you use a lot in easy-to-reach places. If you need to reach something above you, use a strong step stool that has a grab bar. Keep electrical cords out of the way. Do not use floor polish or wax that makes floors slippery. If you must use wax, use non-skid floor wax. Do not have throw rugs and other things on the floor that can make you trip. What can I do with my stairs? Do not  leave any items on the stairs. Make sure that there are handrails on both sides of the stairs and use them. Fix handrails that are broken or loose. Make sure that handrails are as long as the stairways. Check any carpeting to make sure that it is firmly attached to the stairs. Fix any carpet that is loose or worn. Avoid having throw rugs at the top or bottom of the stairs. If you do have throw rugs, attach them to the floor with carpet tape. Make sure that you have a light switch at the top of the stairs and the bottom of the stairs. If you do not have them, ask someone to add them for you. What else can I do to help prevent falls? Wear shoes that: Do not have high heels. Have rubber bottoms. Are comfortable and fit you well. Are closed at the toe. Do not wear sandals. If you use a stepladder: Make sure that it is fully opened. Do not climb a closed stepladder. Make sure that both sides of the  stepladder are locked into place. Ask someone to hold it for you, if possible. Clearly mark and make sure that you can see: Any grab bars or handrails. First and last steps. Where the edge of each step is. Use tools that help you move around (mobility aids) if they are needed. These include: Canes. Walkers. Scooters. Crutches. Turn on the lights when you go into a dark area. Replace any light bulbs as soon as they burn out. Set up your furniture so you have a clear path. Avoid moving your furniture around. If any of your floors are uneven, fix them. If there are any pets around you, be aware of where they are. Review your medicines with your doctor. Some medicines can make you feel dizzy. This can increase your chance of falling. Ask your doctor what other things that you can do to help prevent falls. This information is not intended to replace advice given to you by your health care provider. Make sure you discuss any questions you have with your health care provider. Document Released: 02/05/2009 Document Revised: 09/17/2015 Document Reviewed: 05/16/2014 Elsevier Interactive Patient Education  2017 [employer].

## 2021-11-04 NOTE — Progress Notes (Addendum)
Subjective:   Jeffrey [name] is a 66 y.o. male who presents for Medicare Annual/Subsequent preventive examination.  Review of Systems    Virtual Visit via Telephone Note  I connected with  Jeffrey [name] on 11/04/21 at 11:15 AM EDT by telephone and verified that I am speaking with the correct person using two identifiers.  Location: Patient: Home Provider: Office Persons participating in the virtual visit: patient/Nurse Health Advisor   I discussed the limitations, risks, security and privacy concerns of performing an evaluation and management service by telephone and the availability of in person appointments. The patient expressed understanding and agreed to proceed.  Interactive audio and video telecommunications were attempted between this nurse and patient, however failed, due to patient having technical difficulties OR patient did not have access to video capability.  We continued and completed visit with audio only.  Some vital signs may be absent or patient reported.   Jeffrey Peaches, LPN  Cardiac Risk Factors include: male gender     Objective:    Today's Vitals   11/04/21 1122  Weight: 230 lb (104.3 kg)  Height: '6\' 6"'$  (1.981 m)   Body mass index is 26.58 kg/m.     11/04/2021   11:29 AM 02/10/2021    4:11 PM 11/02/2020    3:26 PM 03/30/2017   11:00 PM 03/30/2017    6:27 PM 03/30/2017    6:26 PM  Advanced Directives  Does Patient Have a Medical Advance Directive? Yes Yes Yes No No Yes  Type of Paramedic of [place];Living will [place];Living will [place];Living will [place];Living will  [place];Living will  Does patient want to make changes to medical advance directive? No - Patient declined   No - Patient declined  No - Patient declined  Copy of [place] in Chart? No - copy requested No - copy requested No - copy requested No -  copy requested  No - copy requested  Would patient like information on creating a medical advance directive?    No - Patient declined      Current Medications (verified) Outpatient Encounter Medications as of 11/04/2021  Medication Sig   chlorpheniramine-HYDROcodone (TUSSIONEX PENNKINETIC ER) 10-8 MG/5ML Take 5 mLs by mouth every 12 (twelve) hours as needed for cough.   Cholecalciferol (VITAMIN D) 50 MCG (2000 UT) CAPS Take by mouth.   ciclopirox (PENLAC) 8 % solution Apply topically at bedtime. Apply over nail and surrounding skin. Apply daily over previous coat. After seven (7) days, may remove with alcohol and continue cycle.   citalopram (CELEXA) 20 MG tablet Take 1 tablet (20 mg total) by mouth daily.   EPINEPHrine 0.3 mg/0.3 mL IJ SOAJ injection Inject 0.3 mLs (0.3 mg total) into the muscle as needed for anaphylaxis.   fish oil-omega-3 fatty acids 1000 MG capsule Take 1 g by mouth daily.   Glucosamine-Chondroitin (GLUCOSAMINE CHONDR COMPLEX PO) Take 1 tablet by mouth 2 (two) times daily.   levocetirizine (XYZAL) 5 MG tablet Take 5 mg by mouth every evening.   magnesium 30 MG tablet Take 30 mg by mouth daily.   Multiple Vitamin (MULITIVITAMIN WITH MINERALS) TABS Take 1 tablet by mouth daily.   propranolol (INDERAL) 20 MG tablet Take 1 tablet (20 mg total) by mouth 2 (two) times daily.   Tart Cherry 1200 MG CAPS Take by mouth.   Turmeric 500 MG TABS Take by mouth.  Zoster Vaccine Adjuvanted [facility]) injection Inject into the muscle.   Zoster Vaccine Adjuvanted [facility]) injection Inject into the muscle.   No facility-administered encounter medications on file as of 11/04/2021.    Allergies (verified) Bee venom and Iodine solution [povidone iodine]   History: Past Medical History:  Diagnosis Date   Abnormal Doppler ultrasound of carotid artery 07-06-2012   normal carotid doppler   Acute appendicitis 03/30/2017   AF (atrial fibrillation) (HCC)    Appendicitis, acute 03/30/2017    Chronic knee pain 07/23/2015   H/O echocardiogram 06-26-2012   Transthoracic echo    EF 55-60%   H/O prior ablation treatment 2011   History of atrial flutter    History of cardiac monitoring 06-15-2012   sinus    Hx of bee sting allergy 07/23/2015   Normal cardiac stress test 05-24-2007   low risk scan   Past Surgical History:  Procedure Laterality Date   ATRIAL FIBRILLATION ABLATION     LAPAROSCOPIC APPENDECTOMY N/A 03/30/2017   Procedure: APPENDECTOMY LAPAROSCOPIC;  Surgeon: Armandina Gemma, MD;  Location: [facility];  Service: General;  Laterality: N/A;   left testicle removed (no cancer, benign mass)     othroscopic knee surgery bil     vericose veins stripped both legs     Family History  Problem Relation Age of Onset   Alcohol abuse Mother    Healthy Mother    Alcohol abuse Father    COPD Father        IT trainer, smoker   Arthritis Maternal Grandmother    Cancer Maternal Grandmother        ?abdominal   Breast cancer Sister    Alzheimer's disease Maternal Grandfather    Heart attack Paternal Grandfather    Social History   Socioeconomic History   Marital status: Married    Spouse name: Not on file   Number of children: Not on file   Years of education: Not on file   Highest education level: Associate degree: occupational, Hotel manager, or vocational program  Occupational History   Occupation: Freight forwarder  Tobacco Use   Smoking status: Former    Packs/day: 1.50    Years: 8.00    Total pack years: 12.00    Types: Cigarettes    Quit date: 07/11/1977    Years since quitting: 44.3   Smokeless tobacco: Never  Substance and Sexual Activity   Alcohol use: Yes    Alcohol/week: 21.0 standard drinks of alcohol    Types: 21 Standard drinks or equivalent per week   Drug use: No   Sexual activity: Not on file  Other Topics Concern   Not on file  Social History Narrative   Work or School: [employer], [place] Situation: lives with wife      Spiritual Beliefs: none       Lifestyle: gym 3 days per week; diet is healthy      Social Determinants of Health   Financial Resource Strain: Low Risk  (11/04/2021)   Overall Financial Resource Strain (CARDIA)    Difficulty of Paying Living Expenses: Not hard at all  Food Insecurity: No Food Insecurity (11/04/2021)   Hunger Vital Sign    Worried About Running Out of Food in the Last Year: Never true    [place] in the Last Year: Never true  Transportation Needs: No Transportation Needs (11/04/2021)   PRAPARE - Hydrologist (Medical): No  Lack of Transportation (Non-Medical): No  Physical Activity: Sufficiently Active (11/04/2021)   Exercise Vital Sign    Days of Exercise per Week: 4 days    Minutes of Exercise per Session: 50 min  Stress: No Stress Concern Present (11/04/2021)   [place]    Feeling of Stress : Not at all  Social Connections: Moderately Integrated (11/04/2021)   Social Connection and Isolation Panel [NHANES]    Frequency of Communication with Friends and Family: Three times a week    Frequency of Social Gatherings with Friends and Family: Twice a week    Attends Religious Services: Never    Marine scientist or Organizations: No    Attends Music therapist: More than 4 times per year    Marital Status: Married    Tobacco Counseling Counseling given: Not Answered   Clinical Intake:  Pre-visit preparation completed: Yes  Pain : No/denies pain     BMI - recorded: 25.9 Nutritional Risks: None Diabetes: No  How often do you need to have someone help you when you read instructions, pamphlets, or other written materials from your doctor or pharmacy?: 1 - Never  Diabetic?  No  Interpreter Needed?: No   Activities of Daily Living    11/04/2021   11:28 AM 11/03/2021    3:32 PM  In your present state of health, do you have any difficulty performing the  following activities:  Hearing? 0 0  Vision? 0 0  Difficulty concentrating or making decisions? 0 0  Walking or climbing stairs? 0 0  Dressing or bathing? 0 0  Doing errands, shopping? 0 0  Preparing Food and eating ? N N  Using the Toilet? N N  In the past six months, have you accidently leaked urine? N N  Do you have problems with loss of bowel control? N N  Managing your Medications? N N  Managing your Finances? N N  Housekeeping or managing your Housekeeping? N N    Patient Care Team: Caren Macadam, MD (Inactive) as PCP - General (Family Medicine)  Indicate any recent Medical Services you may have received from other than [facility] providers in the past year (date may be approximate).     Assessment:   This is a routine wellness examination for Inri.  Hearing/Vision screen Hearing Screening - Comments:: No difficulty hearing Vision Screening - Comments:: No vision difficulty .Followed by Dr Nicki Reaper  Dietary issues and exercise activities discussed: Exercise limited by: None identified   Goals Addressed               This Visit's Progress     No current goals (pt-stated)         Depression Screen    11/04/2021   11:25 AM 04/28/2021   10:10 AM 11/02/2020    3:27 PM 11/02/2020    3:25 PM 04/27/2020    9:58 AM 03/20/2019   11:10 AM 08/11/2016    8:27 AM  PHQ 2/9 Scores  PHQ - 2 Score 0 0 0 0 0 0 0  PHQ- 9 Score  0         Fall Risk    11/04/2021   11:29 AM 11/03/2021    3:32 PM 04/28/2021   10:10 AM 03/22/2021    9:06 AM 11/02/2020    3:27 PM  Fall Risk   Falls in the past year? 0 0 0 0 0  Number falls in past yr: 0  0  Injury with Fall? 0 0   0  Risk for fall due to : No Fall Risks      Follow up     Falls evaluation completed    FALL RISK PREVENTION PERTAINING TO THE HOME:  Any stairs in or around the home? No  If so, are there any without handrails? No  Home free of loose throw rugs in walkways, pet beds, electrical cords, etc? Yes  Adequate  lighting in your home to reduce risk of falls? Yes   ASSISTIVE DEVICES UTILIZED TO PREVENT FALLS:  Life alert? No  Use of a cane, walker or w/c? No  Grab bars in the bathroom? No  Shower chair or bench in shower? No  Elevated toilet seat or a handicapped toilet? No   TIMED UP AND GO:  Was the test performed? No . Audio Visit  Cognitive Function:      04/28/2021   12:15 PM  Montreal Cognitive Assessment   Visuospatial/ Executive (0/5) 5  Naming (0/3) 3  Attention: Read list of digits (0/2) 2  Attention: Read list of letters (0/1) 1  Attention: Serial 7 subtraction starting at 100 (0/3) 3  Language: Repeat phrase (0/2) 2  Language : Fluency (0/1) 1  Abstraction (0/2) 2  Delayed Recall (0/5) 4  Orientation (0/6) 6  Total 29  Adjusted Score (based on education) 29      11/04/2021   11:29 AM  6CIT Screen  What Year? 0 points  What month? 0 points  What time? 0 points  Count back from 20 0 points  Months in reverse 0 points  Repeat phrase 0 points  Total Score 0 points    Immunizations Immunization History  Administered Date(s) Administered   Fluad Quad(high Dose 65+) 01/24/2019   Influenza Split 02/16/2011, 01/20/2012   Influenza Whole 02/04/2010   Influenza, High Dose Seasonal PF 01/17/2020   Influenza,inj,Quad PF,6+ Mos 01/17/2014, 02/19/2015, 01/18/2016, 01/17/2017, 01/15/2018   Influenza-Unspecified 01/15/2021   PFIZER(Purple Top)SARS-COV-2 Vaccination 05/09/2019, 05/27/2019, 02/07/2020, 01/15/2021   PNEUMOCOCCAL CONJUGATE-20 04/28/2021   Pneumococcal Polysaccharide-23 01/24/2019   Td 04/26/2007   Tdap 04/25/2017   Unspecified SARS-COV-2 Vaccination 01/16/2021   Zoster Recombinat (Shingrix) 05/10/2021   Zoster, Live 08/04/2014    TDAP status: Up to date  Flu Vaccine status: Up to date  Pneumococcal vaccine status: Up to date  Covid-19 vaccine status: Completed vaccines  Qualifies for Shingles Vaccine? Yes   Zostavax completed Yes   Shingrix  Completed?: Yes  Screening Tests Health Maintenance  Topic Date Due   Zoster Vaccines- Shingrix (2 of 2) 07/05/2021   COVID-19 Vaccine (5 - Booster for Pfizer series) 11/20/2021 (Originally 03/13/2021)   INFLUENZA VACCINE  11/23/2021   COLONOSCOPY (Pts 45-45yr Insurance coverage will need to be confirmed)  09/03/2023   TETANUS/TDAP  04/26/2027   Pneumonia Vaccine 68 Years old  Completed   Hepatitis C Screening  Completed   HPV VACCINES  Aged Out    Health Maintenance  Health Maintenance Due  Topic Date Due   Zoster Vaccines- Shingrix (2 of 2) 07/05/2021    Colorectal cancer screening: Type of screening: Colonoscopy. Completed 09/02/13. Repeat every 10 years  Lung Cancer Screening: (Low Dose CT Chest recommended if Age 68-80years, 30 pack-year currently smoking OR have quit w/in 15years.) does not qualify.     Additional Screening:  Hepatitis C Screening: does qualify; Completed 08/11/15  Vision Screening: Recommended annual ophthalmology exams for early detection of glaucoma and other disorders of  the eye. Is the patient up to date with their annual eye exam?  Yes  Who is the provider or what is the name of the office in which the patient attends annual eye exams? Dr Nicki Reaper If pt is not established with a provider, would they like to be referred to a provider to establish care? No .   Dental Screening: Recommended annual dental exams for proper oral hygiene  Community Resource Referral / Chronic Care Management:  CRR required this visit?  No   CCM required this visit?  No      Plan:     I have personally reviewed and noted the following in the patient's chart:   Medical and social history Use of alcohol, tobacco or illicit drugs  Current medications and supplements including opioid prescriptions. Patient is not currently taking opioid prescriptions. Functional ability and status Nutritional status Physical activity Advanced directives List of other  physicians Hospitalizations, surgeries, and ER visits in previous 12 months Vitals Screenings to include cognitive, depression, and falls Referrals and appointments  In addition, I have reviewed and discussed with patient certain preventive protocols, quality metrics, and best practice recommendations. A written personalized care plan for preventive services as well as general preventive health recommendations were provided to patient.     Jeffrey Peaches, LPN   [id number]   Nurse Notes: None

## 2021-11-10 ENCOUNTER — Ambulatory Visit (INDEPENDENT_AMBULATORY_CARE_PROVIDER_SITE_OTHER): Payer: Medicare Other | Admitting: Family

## 2021-11-10 ENCOUNTER — Ambulatory Visit (INDEPENDENT_AMBULATORY_CARE_PROVIDER_SITE_OTHER): Payer: Medicare Other

## 2021-11-10 ENCOUNTER — Encounter: Payer: Self-pay | Admitting: Family

## 2021-11-10 VITALS — BP 84/58 | HR 65 | Temp 97.5°F | Ht 78.0 in | Wt 231.9 lb

## 2021-11-10 DIAGNOSIS — M940 Chondrocostal junction syndrome [Tietze]: Secondary | ICD-10-CM

## 2021-11-10 DIAGNOSIS — R1011 Right upper quadrant pain: Secondary | ICD-10-CM

## 2021-11-10 DIAGNOSIS — R0781 Pleurodynia: Secondary | ICD-10-CM

## 2021-11-10 LAB — COMPREHENSIVE METABOLIC PANEL
ALT: 36 U/L (ref 0–53)
AST: 35 U/L (ref 0–37)
Albumin: 4.5 g/dL (ref 3.5–5.2)
Alkaline Phosphatase: 53 U/L (ref 39–117)
BUN: 13 mg/dL (ref 6–23)
CO2: 31 mEq/L (ref 19–32)
Calcium: 9.2 mg/dL (ref 8.4–10.5)
Chloride: 101 mEq/L (ref 96–112)
Creatinine, Ser: 0.91 mg/dL (ref 0.40–1.50)
GFR: 86.7 mL/min (ref >60.00)
Glucose, Bld: 77 mg/dL (ref 70–99)
Potassium: 4.5 mEq/L (ref 3.5–5.1)
Sodium: 137 mEq/L (ref 135–145)
Total Bilirubin: 0.7 mg/dL (ref 0.2–1.2)
Total Protein: 6.7 g/dL (ref 6.0–8.3)

## 2021-11-11 NOTE — Progress Notes (Signed)
Acute Office Visit  Subjective:     Patient ID: Jeffrey [name], male    DOB: 1956/02/03, 66 y.o.   MRN: [id number]  Chief Complaint  Patient presents with   Chest Pain    Patient complains of right-sided rib pain x2 weeks, especially noted when bending over and with any movement, denies shortness of breath, no known injury, tried Advil with no relief    HPI Patient is in today with c/o right sided rib/chest wall pain x 2 weeks. Reports he has been working on his deck and lifting more. Pain 5/10. Worse with movement. Has taken Advil that has not helped much. Is concerned because he has had PNA in the past. Also concerned about his liver/gallbladder and wants to be sure it is ok. No nausea or vomiting.    Review of Systems  Cardiovascular:  Positive for chest pain.       Right chest wall pain  All other systems reviewed and are negative.   Past Medical History:  Diagnosis Date   Abnormal Doppler ultrasound of carotid artery 07-06-2012   normal carotid doppler   Acute appendicitis 03/30/2017   AF (atrial fibrillation) (HCC)    Appendicitis, acute 03/30/2017   Chronic knee pain 07/23/2015   H/O echocardiogram 06-26-2012   Transthoracic echo    EF 55-60%   H/O prior ablation treatment 2011   History of atrial flutter    History of cardiac monitoring 06-15-2012   sinus    Hx of bee sting allergy 07/23/2015   Normal cardiac stress test 05-24-2007   low risk scan    Social History   Socioeconomic History   Marital status: Married    Spouse name: Not on file   Number of children: Not on file   Years of education: Not on file   Highest education level: Associate degree: occupational, Hotel manager, or vocational program  Occupational History   Occupation: Freight forwarder  Tobacco Use   Smoking status: Former    Packs/day: 1.50    Years: 8.00    Total pack years: 12.00    Types: Cigarettes    Quit date: 07/11/1977    Years since quitting: 44.3   Smokeless tobacco: Never  Substance and  Sexual Activity   Alcohol use: Yes    Alcohol/week: 21.0 standard drinks of alcohol    Types: 21 Standard drinks or equivalent per week   Drug use: No   Sexual activity: Not on file  Other Topics Concern   Not on file  Social History Narrative   Work or School: [employer], [place] Situation: lives with wife      Spiritual Beliefs: none      Lifestyle: gym 3 days per week; diet is healthy      Social Determinants of Health   Financial Resource Strain: Low Risk  (11/04/2021)   Overall Financial Resource Strain (CARDIA)    Difficulty of Paying Living Expenses: Not hard at all  Food Insecurity: No Food Insecurity (11/04/2021)   Hunger Vital Sign    Worried About Running Out of Food in the Last Year: Never true    [place] in the Last Year: Never true  Transportation Needs: No Transportation Needs (11/04/2021)   PRAPARE - Hydrologist (Medical): No    Lack of Transportation (Non-Medical): No  Physical Activity: Sufficiently Active (11/04/2021)   Exercise Vital Sign    Days of Exercise per Week:  4 days    Minutes of Exercise per Session: 50 min  Stress: No Stress Concern Present (11/04/2021)   [place]    Feeling of Stress : Not at all  Social Connections: Moderately Integrated (11/04/2021)   Social Connection and Isolation Panel [NHANES]    Frequency of Communication with Friends and Family: Three times a week    Frequency of Social Gatherings with Friends and Family: Twice a week    Attends Religious Services: Never    Marine scientist or Organizations: No    Attends Music therapist: More than 4 times per year    Marital Status: Married  Human resources officer Violence: Not At Risk (11/04/2021)   Humiliation, Afraid, Rape, and Kick questionnaire    Fear of Current or Ex-Partner: No    Emotionally Abused: No    Physically Abused: No    Sexually  Abused: No    Past Surgical History:  Procedure Laterality Date   ATRIAL FIBRILLATION ABLATION     LAPAROSCOPIC APPENDECTOMY N/A 03/30/2017   Procedure: APPENDECTOMY LAPAROSCOPIC;  Surgeon: Armandina Gemma, MD;  Location: [facility];  Service: General;  Laterality: N/A;   left testicle removed (no cancer, benign mass)     othroscopic knee surgery bil     vericose veins stripped both legs      Family History  Problem Relation Age of Onset   Alcohol abuse Mother    Healthy Mother    Alcohol abuse Father    COPD Father        IT trainer, smoker   Arthritis Maternal Grandmother    Cancer Maternal Grandmother        ?abdominal   Breast cancer Sister    Alzheimer's disease Maternal Grandfather    Heart attack Paternal Grandfather     Allergies  Allergen Reactions   Bee Venom Anaphylaxis   Iodine Solution [Povidone Iodine] Itching    Patient states his arm was irritated following a previous arm surgery where he had been scrubbed with betadine.    Current Outpatient Medications on File Prior to Visit  Medication Sig Dispense Refill   chlorpheniramine-HYDROcodone (TUSSIONEX PENNKINETIC ER) 10-8 MG/5ML Take 5 mLs by mouth every 12 (twelve) hours as needed for cough. 115 mL 0   Cholecalciferol (VITAMIN D) 50 MCG (2000 UT) CAPS Take by mouth.     ciclopirox (PENLAC) 8 % solution Apply topically at bedtime. Apply over nail and surrounding skin. Apply daily over previous coat. After seven (7) days, may remove with alcohol and continue cycle. 6.6 mL 5   citalopram (CELEXA) 20 MG tablet Take 1 tablet (20 mg total) by mouth daily. 90 tablet 3   EPINEPHrine 0.3 mg/0.3 mL IJ SOAJ injection Inject 0.3 mLs (0.3 mg total) into the muscle as needed for anaphylaxis. 1 each 2   fish oil-omega-3 fatty acids 1000 MG capsule Take 1 g by mouth daily.     Glucosamine-Chondroitin (GLUCOSAMINE CHONDR COMPLEX PO) Take 1 tablet by mouth 2 (two) times daily.     levocetirizine (XYZAL) 5 MG tablet Take 5 mg by  mouth every evening.     magnesium 30 MG tablet Take 30 mg by mouth daily.     Multiple Vitamin (MULITIVITAMIN WITH MINERALS) TABS Take 1 tablet by mouth daily.     propranolol (INDERAL) 20 MG tablet Take 1 tablet (20 mg total) by mouth 2 (two) times daily. 180 tablet 3   Tart Cherry 1200 MG  CAPS Take by mouth.     Turmeric 500 MG TABS Take by mouth.     Zoster Vaccine Adjuvanted [facility]) injection Inject into the muscle. 0.5 mL 1   Zoster Vaccine Adjuvanted [facility]) injection Inject into the muscle. 0.5 mL 0   No current facility-administered medications on file prior to visit.    BP (!) 84/58 (BP Location: Left Arm, Patient Position: Sitting, Cuff Size: Large)   Pulse 65   Temp (!) 97.5 F (36.4 C) (Oral)   Ht '6\' 6"'$  (1.981 m)   Wt 231 lb 14.4 oz (105.2 kg)   SpO2 100%   BMI 26.80 kg/m chart     Objective:    BP (!) 84/58 (BP Location: Left Arm, Patient Position: Sitting, Cuff Size: Large)   Pulse 65   Temp (!) 97.5 F (36.4 C) (Oral)   Ht '6\' 6"'$  (1.981 m)   Wt 231 lb 14.4 oz (105.2 kg)   SpO2 100%   BMI 26.80 kg/m    Physical Exam Vitals and nursing note reviewed.  Constitutional:      Appearance: He is well-developed.  Cardiovascular:     Rate and Rhythm: Normal rate and regular rhythm.  Pulmonary:     Effort: Pulmonary effort is normal.  Chest:     Chest wall: Tenderness present. No deformity or swelling.       Comments: Pinpoint tenderness Abdominal:     Palpations: Abdomen is soft.  Musculoskeletal:        General: Normal range of motion.  Skin:    General: Skin is warm and dry.  Neurological:     Mental Status: He is alert.     Results for orders placed or performed in visit on 11/10/21  CMP  Result Value Ref Range   Sodium 137 135 - 145 mEq/L   Potassium 4.5 3.5 - 5.1 mEq/L   Chloride 101 96 - 112 mEq/L   CO2 31 19 - 32 mEq/L   Glucose, Bld 77 70 - 99 mg/dL   BUN 13 6 - 23 mg/dL   Creatinine, Ser 0.91 0.40 - 1.50 mg/dL   Total Bilirubin  0.7 0.2 - 1.2 mg/dL   Alkaline Phosphatase 53 39 - 117 U/L   AST 35 0 - 37 U/L   ALT 36 0 - 53 U/L   Total Protein 6.7 6.0 - 8.3 g/dL   Albumin 4.5 3.5 - 5.2 g/dL   GFR 86.70 >60.00 mL/min   Calcium 9.2 8.4 - 10.5 mg/dL        Assessment & Plan:   Problem List Items Addressed This Visit   None Visit Diagnoses     Rib pain on right side    -  Primary   Relevant Orders   DG Ribs Unilateral Right (Completed)   CMP (Completed)   Costochondritis, acute       Right upper quadrant pain       Relevant Orders   CMP (Completed)       Labs obtained will f/u pending results Xrays obtained will f/u pending results Call the office if symptoms worsen or persist.  Recheck as scheduled and sooner as needed.  No orders of the defined types were placed in this encounter.   No follow-ups on file.  Kennyth Arnold, FNP

## 2021-11-17 ENCOUNTER — Ambulatory Visit: Payer: Medicare Other | Admitting: Family Medicine

## 2021-12-20 ENCOUNTER — Ambulatory Visit: Payer: Medicare Other | Admitting: Family Medicine

## 2021-12-29 ENCOUNTER — Ambulatory Visit (INDEPENDENT_AMBULATORY_CARE_PROVIDER_SITE_OTHER): Payer: Medicare Other | Admitting: Family Medicine

## 2021-12-29 ENCOUNTER — Encounter: Payer: Self-pay | Admitting: Family Medicine

## 2021-12-29 VITALS — BP 116/80 | HR 65 | Temp 98.9°F | Wt 228.6 lb

## 2021-12-29 DIAGNOSIS — R197 Diarrhea, unspecified: Secondary | ICD-10-CM

## 2021-12-29 DIAGNOSIS — U099 Post covid-19 condition, unspecified: Secondary | ICD-10-CM

## 2021-12-29 DIAGNOSIS — H66002 Acute suppurative otitis media without spontaneous rupture of ear drum, left ear: Secondary | ICD-10-CM

## 2021-12-29 DIAGNOSIS — J069 Acute upper respiratory infection, unspecified: Secondary | ICD-10-CM | POA: Diagnosis not present

## 2021-12-29 MED ORDER — AMOXICILLIN-POT CLAVULANATE 500-125 MG PO TABS: 1.0000 | ORAL_TABLET | Freq: Two times a day (BID) | ORAL | 0 refills | Status: AC

## 2021-12-29 NOTE — Progress Notes (Signed)
Subjective:    Patient ID: Jeffrey [name], male    DOB: [birth year], 66 y.o.   MRN: [id number]  Chief Complaint  Patient presents with   URI    Tested + for COVID 2 wks ago, seemed to be getting over all the symptoms and then they started again. Has taken decongestants, and other OTC treatments and only helped shortly.     HPI Patient was seen today for ongoing concern.  Pt with continued respiratory symptoms s/p COVID-19 infection 2 wks ago. Was in [place].  Since flying back had positive at home COVID test.  Initial symptoms included loss of smell, cough, rhinorrhea, drainage, sore throat, feeling achy.  Patient denies fever.  In the last few days patient with headache, diarrhea, cough, sore throat.  Tried OTC meds.  Appetite is okay.  Past Medical History:  Diagnosis Date   Abnormal Doppler ultrasound of carotid artery 07-06-2012   normal carotid doppler   Acute appendicitis 03/30/2017   AF (atrial fibrillation) (HCC)    Appendicitis, acute 03/30/2017   Chronic knee pain 07/23/2015   H/O echocardiogram 06-26-2012   Transthoracic echo    EF 55-60%   H/O prior ablation treatment 2011   History of atrial flutter    History of cardiac monitoring 06-15-2012   sinus    Hx of bee sting allergy 07/23/2015   Normal cardiac stress test 05-24-2007   low risk scan    Allergies  Allergen Reactions   Bee Venom Anaphylaxis   Iodine Solution [Povidone Iodine] Itching    Patient states his arm was irritated following a previous arm surgery where he had been scrubbed with betadine.    ROS General: Denies fever, chills, night sweats, changes in weight, changes in appetite HEENT: Denies headaches, ear pain, changes in vision  +rhinorrhea, sore throat, headache CV: Denies CP, palpitations, SOB, orthopnea Pulm: Denies SOB, wheezing + cough GI: Denies abdominal pain, nausea, vomiting, constipation + diarrhea GU: Denies dysuria, hematuria, frequency Msk: Denies muscle cramps, joint pains Neuro:  Denies weakness, numbness, tingling Skin: Denies rashes, bruising Psych: Denies depression, anxiety, hallucinations     Objective:    Blood pressure 116/80, pulse 65, temperature 98.9 F (37.2 C), temperature source Oral, weight 228 lb 9.6 oz (103.7 kg), SpO2 95 %.  Gen. Pleasant, well-nourished, in no distress, normal affect   HEENT: Effort/AT, face symmetric, conjunctiva clear, no scleral icterus, PERRLA, EOMI, nares patent without drainage, no TTP of sinuses.  Pharynx with clear drainage, no erythema or exudate.  Left TM full, erythematous, with suppurative fluid.  Right TM erythematous and top right corner.  No cervical lymphadenopathy. Lungs: no accessory muscle use, CTAB, no wheezes or rales Cardiovascular: RRR, no m/r/g, no peripheral edema Abdomen: BS hyperactive, soft, NT/ND Musculoskeletal: No deformities, no cyanosis or clubbing, normal tone Neuro:  A&Ox3, CN II-XII intact, normal gait Skin:  Warm, no lesions/ rash   Wt Readings from Last 3 Encounters:  12/29/21 228 lb 9.6 oz (103.7 kg)  11/10/21 231 lb 14.4 oz (105.2 kg)  11/04/21 230 lb (104.3 kg)    Lab Results  Component Value Date   WBC 4.3 04/28/2021   HGB 16.2 04/28/2021   HCT 47.2 04/28/2021   PLT 168.0 04/28/2021   GLUCOSE 77 11/10/2021   CHOL 202 (H) 04/28/2021   TRIG 106.0 04/28/2021   HDL 60.70 04/28/2021   LDLCALC 120 (H) 04/28/2021   ALT 36 11/10/2021   AST 35 11/10/2021   NA 137 11/10/2021   K 4.5  11/10/2021   CL 101 11/10/2021   CREATININE 0.91 11/10/2021   BUN 13 11/10/2021   CO2 31 11/10/2021   TSH 1.86 04/28/2021   PSA 1.02 04/28/2021   INR 1.07 09/06/2011   HGBA1C 5.1 08/14/2017    Assessment/Plan:  Acute suppurative otitis media of left ear without spontaneous rupture of tympanic membrane, recurrence not specified  -Supportive care including Tylenol or NSAIDs, antihistamine -Start ABX -Given precautions - Plan: amoxicillin-clavulanate (AUGMENTIN) 500-125 MG tablet  Viral upper  respiratory tract infection -Continue supportive care including OTC cough/cold medications, antihistamine -Consider Flonase nasal spray  Diarrhea, unspecified type -Likely 2/2 recent COVID-19 infection -Probiotic, bland foods, rehydration solution, OTC Imodium as needed -Given handouts  Post-acute sequelae of COVID-19 -COVID-19 infection 2 weeks ago, now with AOM and diarrhea. -Continue supportive care  F/u as needed  Grier Mitts, MD

## 2022-01-04 DIAGNOSIS — L570 Actinic keratosis: Secondary | ICD-10-CM | POA: Diagnosis not present

## 2022-01-04 NOTE — Progress Notes (Signed)
[place] Phone: [phone] Subjective:   Jeffrey [name], am serving as a scribe for Dr. Hulan Saas.  I'm seeing this patient by the request  of:  Caren Macadam, MD (Inactive)  CC: left foot pain   [id number]  11/03/2021 Continues to have arthritis but significant decrease in the swelling.  Discussed proper shoes, work boots, rcovery sandals, worsening pain see me otherwise follow up as needed.   Updated 01/05/2022 Jeffrey [name] is a 66 y.o. male coming in with complaint of left foot pain. Patient states that his foot pain has subsided.  New complaint: R elbow pain since pulling bag from overhead compartment in airplane. Feels he has decrease in ROM with end range flexion and extension intermittently throughout day. Can occur when he wakes up in morning, holds bag in hand with elbow fully extended, or when he is playing with puppy. History of elbow surgery 15 years ago.        Past Medical History:  Diagnosis Date   Abnormal Doppler ultrasound of carotid artery 07-06-2012   normal carotid doppler   Acute appendicitis 03/30/2017   AF (atrial fibrillation) (HCC)    Appendicitis, acute 03/30/2017   Chronic knee pain 07/23/2015   H/O echocardiogram 06-26-2012   Transthoracic echo    EF 55-60%   H/O prior ablation treatment 2011   History of atrial flutter    History of cardiac monitoring 06-15-2012   sinus    Hx of bee sting allergy 07/23/2015   Normal cardiac stress test 05-24-2007   low risk scan   Past Surgical History:  Procedure Laterality Date   ATRIAL FIBRILLATION ABLATION     LAPAROSCOPIC APPENDECTOMY N/A 03/30/2017   Procedure: APPENDECTOMY LAPAROSCOPIC;  Surgeon: Armandina Gemma, MD;  Location: [facility];  Service: General;  Laterality: N/A;   left testicle removed ([name] cancer, benign mass)     othroscopic knee surgery bil     vericose veins stripped both legs     Social History    Socioeconomic History   Marital status: Married    Spouse name: Not on file   Number of children: Not on file   Years of education: Not on file   Highest education level: Associate degree: occupational, Hotel manager, or vocational program  Occupational History   Occupation: Freight forwarder  Tobacco Use   Smoking status: Former    Packs/day: 1.50    Years: 8.00    Total pack years: 12.00    Types: Cigarettes    Quit date: 07/11/1977    Years since quitting: 44.5   Smokeless tobacco: Never  Substance and Sexual Activity   Alcohol use: Yes    Alcohol/week: 21.0 standard drinks of alcohol    Types: 21 Standard drinks or equivalent per week   Drug use: [name]   Sexual activity: Not on file  Other Topics Concern   Not on file  Social History Narrative   Work or School: [employer], [place] Situation: lives with wife      Spiritual Beliefs: none      Lifestyle: gym 3 days per week; diet is healthy      Social Determinants of Health   Financial Resource Strain: Low Risk  (11/04/2021)   Overall Financial Resource Strain (CARDIA)    Difficulty of Paying Living Expenses: Not hard at all  Food Insecurity: [name] Food Insecurity (11/04/2021)   Hunger Vital Sign  Worried About Charity fundraiser in the Last Year: Never true    [place] in the Last Year: Never true  Transportation Needs: [name] Transportation Needs (11/04/2021)   PRAPARE - Hydrologist (Medical): [name]    Lack of Transportation (Non-Medical): [name]  Physical Activity: Sufficiently Active (11/04/2021)   Exercise Vital Sign    Days of Exercise per Week: 4 days    Minutes of Exercise per Session: 50 min  Stress: [name] Stress Concern Present (11/04/2021)   [place]    Feeling of Stress : Not at all  Social Connections: Moderately Integrated (11/04/2021)   Social Connection and Isolation Panel [NHANES]    Frequency of  Communication with Friends and Family: Three times a week    Frequency of Social Gatherings with Friends and Family: Twice a week    Attends Religious Services: Never    Marine scientist or Organizations: [name]    Attends Music therapist: More than 4 times per year    Marital Status: Married   Allergies  Allergen Reactions   Bee Venom Anaphylaxis   Iodine Solution [Povidone Iodine] Itching    Patient states his arm was irritated following a previous arm surgery where he had been scrubbed with betadine.   Family History  Problem Relation Age of Onset   Alcohol abuse Mother    Healthy Mother    Alcohol abuse Father    COPD Father        IT trainer, smoker   Arthritis Maternal Grandmother    Cancer Maternal Grandmother        ?abdominal   Breast cancer Sister    Alzheimer's disease Maternal Grandfather    Heart attack Paternal Grandfather      Current Outpatient Medications (Cardiovascular):    EPINEPHrine 0.3 mg/0.3 mL IJ SOAJ injection, Inject 0.3 mLs (0.3 mg total) into the muscle as needed for anaphylaxis.   propranolol (INDERAL) 20 MG tablet, Take 1 tablet (20 mg total) by mouth 2 (two) times daily.  Current Outpatient Medications (Respiratory):    chlorpheniramine-HYDROcodone (TUSSIONEX PENNKINETIC ER) 10-8 MG/5ML, Take 5 mLs by mouth every 12 (twelve) hours as needed for cough.   levocetirizine (XYZAL) 5 MG tablet, Take 5 mg by mouth every evening.  Current Outpatient Medications (Analgesics):    meloxicam (MOBIC) 15 MG tablet, Take 1 tablet (15 mg total) by mouth daily.   Current Outpatient Medications (Other):    Cholecalciferol (VITAMIN D) 50 MCG (2000 UT) CAPS, Take by mouth.   ciclopirox (PENLAC) 8 % solution, Apply topically at bedtime. Apply over nail and surrounding skin. Apply daily over previous coat. After seven (7) days, may remove with alcohol and continue cycle.   citalopram (CELEXA) 20 MG tablet, Take 1 tablet (20 mg total) by mouth  daily.   fish oil-omega-3 fatty acids 1000 MG capsule, Take 1 g by mouth daily.   Glucosamine-Chondroitin (GLUCOSAMINE CHONDR COMPLEX PO), Take 1 tablet by mouth 2 (two) times daily.   magnesium 30 MG tablet, Take 30 mg by mouth daily.   Multiple Vitamin (MULITIVITAMIN WITH MINERALS) TABS, Take 1 tablet by mouth daily.   Tart Cherry 1200 MG CAPS, Take by mouth.   Turmeric 500 MG TABS, Take by mouth.   Zoster Vaccine Adjuvanted [facility]) injection, Inject into the muscle.   Zoster Vaccine Adjuvanted [facility]) injection, Inject into the muscle.   Reviewed prior external information including notes  and imaging from  primary care provider As well as notes that were available from care everywhere and other healthcare systems.  Past medical history, social, surgical and family history all reviewed in electronic medical record.  [name] pertanent information unless stated regarding to the chief complaint.   Review of Systems:  [name] headache, visual changes, nausea, vomiting, diarrhea, constipation, dizziness, abdominal pain, skin rash, fevers, chills, night sweats, weight loss, swollen lymph nodes, body aches, joint swelling, chest pain, shortness of breath, mood changes. POSITIVE muscle aches  Objective  Blood pressure 112/74, pulse 70, height '6\' 6"'$  (1.981 m), weight 227 lb (103 kg), SpO2 98 %.   General: [name] apparent distress alert and oriented x3 mood and affect normal, dressed appropriately.  HEENT: Pupils equal, extraocular movements intact  Respiratory: Patient's speak in full sentences and does not appear short of breath  Cardiovascular: [name] lower extremity edema, non tender, [name] erythema  Right elbow exam shows [name] significant swelling noted on inspection.  Patient though does have decreased extension of the elbow noted.  Also lacks last 5 degrees of supination.  Good grip strength noted.  [name] worsening pain at the elbow with Spurling's test of the neck.  Limited muscular skeletal ultrasound was  performed and interpreted by Hulan Saas, M  Limited ultrasound of patient's right elbow does show that there is a significant hypoechoic changes coming from the joint itself especially the posterior aspect that is consistent with a large effusion.  Patient does have significant narrowing of the posterior aspect at the olecranon and does have multiple bone spurring noted.  Does appear to have a potential small acute fracture of one of them with increasing in neovascularization in Doppler flow. Impression: Elbow arthritis with questionable spur fracture    Impression and Recommendations:    The above documentation has been reviewed and is accurate and complete Lyndal Pulley, DO

## 2022-01-05 ENCOUNTER — Ambulatory Visit (INDEPENDENT_AMBULATORY_CARE_PROVIDER_SITE_OTHER): Payer: Medicare Other

## 2022-01-05 ENCOUNTER — Ambulatory Visit: Payer: Self-pay

## 2022-01-05 ENCOUNTER — Ambulatory Visit (INDEPENDENT_AMBULATORY_CARE_PROVIDER_SITE_OTHER): Payer: Medicare Other | Admitting: Family Medicine

## 2022-01-05 ENCOUNTER — Encounter: Payer: Self-pay | Admitting: Family Medicine

## 2022-01-05 VITALS — BP 112/74 | HR 70 | Ht 78.0 in | Wt 227.0 lb

## 2022-01-05 DIAGNOSIS — M79672 Pain in left foot: Secondary | ICD-10-CM | POA: Diagnosis not present

## 2022-01-05 DIAGNOSIS — M25521 Pain in right elbow: Secondary | ICD-10-CM

## 2022-01-05 DIAGNOSIS — M19021 Primary osteoarthritis, right elbow: Secondary | ICD-10-CM

## 2022-01-05 MED ORDER — MELOXICAM 15 MG PO TABS: 15.0000 mg | ORAL_TABLET | Freq: Every day | ORAL | 0 refills | Status: DC

## 2022-01-05 NOTE — Patient Instructions (Signed)
Meloxicam '15mg'$  daily for 10 days Ice 20 min 2x day Avoid full extension with weight Compression with repetitive activity See me in 5-6 weeks

## 2022-01-06 ENCOUNTER — Encounter: Payer: Self-pay | Admitting: Family Medicine

## 2022-01-06 DIAGNOSIS — M19021 Primary osteoarthritis, right elbow: Secondary | ICD-10-CM | POA: Insufficient documentation

## 2022-01-06 NOTE — Assessment & Plan Note (Signed)
Patient did have surgical intervention on this elbow.  Her incisions and patient's report does seem to be more on the posterior aspect.  I think that this could have caused some excessive trauma but then unfortunately has caused some arthritic changes.  I believe the patient have some exacerbation of an underlying problem.  We discussed with the fusion of the joint we could consider injection or even aspiration.  Patient declined at the moment and would like to try anti-inflammatories, compression, and range of motion exercises.  Discussed with patient that may need to consider though if this does not completely resolve in the next 4 to 6 weeks when we see him again we should consider the aspiration.  Could consider advanced imaging but do not think at this moment it would change medical management

## 2022-01-14 ENCOUNTER — Other Ambulatory Visit (HOSPITAL_BASED_OUTPATIENT_CLINIC_OR_DEPARTMENT_OTHER): Payer: Self-pay

## 2022-01-14 MED ORDER — FLUAD QUADRIVALENT 0.5 ML IM PRSY
PREFILLED_SYRINGE | INTRAMUSCULAR | 0 refills | Status: DC
  Filled 2022-01-14: qty 0.5, 1d supply, fill #0

## 2022-01-17 NOTE — Progress Notes (Unsigned)
[place] Phone: [phone] Subjective:   [name], am serving as a scribe for Dr. Hulan Saas.  I'm seeing this patient by the request  of:  Caren Macadam, MD (Inactive)  CC: elbow follow up   [id number]  01/05/2022 Patient did have surgical intervention on this elbow.  Her incisions and patient's report does seem to be more on the posterior aspect.  I think that this could have caused some excessive trauma but then unfortunately has caused some arthritic changes.  I believe the patient have some exacerbation of an underlying problem.  We discussed with the fusion of the joint we could consider injection or even aspiration.  Patient declined at the moment and would like to try anti-inflammatories, compression, and range of motion exercises.  Discussed with patient that may need to consider though if this does not completely resolve in the next 4 to 6 weeks when we see him again we should consider the aspiration.  Could consider advanced imaging but do not think at this moment it would change medical management  Update 01/18/2022 JULIUS MATUS is a 66 y.o. male coming in with complaint of R elbow pain. Patient states that he has some improvement but still has locking of elbow at night intermittently.  Right elbow exam does seem to be better.  Approximately 50% better at this time.      Past Medical History:  Diagnosis Date   Abnormal Doppler ultrasound of carotid artery 07-06-2012   normal carotid doppler   Acute appendicitis 03/30/2017   AF (atrial fibrillation) (HCC)    Appendicitis, acute 03/30/2017   Chronic knee pain 07/23/2015   H/O echocardiogram 06-26-2012   Transthoracic echo    EF 55-60%   H/O prior ablation treatment 2011   History of atrial flutter    History of cardiac monitoring 06-15-2012   sinus    Hx of bee sting allergy 07/23/2015   Normal cardiac stress test 05-24-2007   low  risk scan   Past Surgical History:  Procedure Laterality Date   ATRIAL FIBRILLATION ABLATION     LAPAROSCOPIC APPENDECTOMY N/A 03/30/2017   Procedure: APPENDECTOMY LAPAROSCOPIC;  Surgeon: Armandina Gemma, MD;  Location: [facility];  Service: General;  Laterality: N/A;   left testicle removed (no cancer, benign mass)     othroscopic knee surgery bil     vericose veins stripped both legs     Social History   Socioeconomic History   Marital status: Married    Spouse name: Not on file   Number of children: Not on file   Years of education: Not on file   Highest education level: Associate degree: occupational, Hotel manager, or vocational program  Occupational History   Occupation: Freight forwarder  Tobacco Use   Smoking status: Former    Packs/day: 1.50    Years: 8.00    Total pack years: 12.00    Types: Cigarettes    Quit date: 07/11/1977    Years since quitting: 44.5   Smokeless tobacco: Never  Substance and Sexual Activity   Alcohol use: Yes    Alcohol/week: 21.0 standard drinks of alcohol    Types: 21 Standard drinks or equivalent per week   Drug use: No   Sexual activity: Not on file  Other Topics Concern   Not on file  Social History Narrative   Work or School: [employer], [place] Situation: lives with  wife      Spiritual Beliefs: none      Lifestyle: gym 3 days per week; diet is healthy      Social Determinants of Health   Financial Resource Strain: Low Risk  (11/04/2021)   Overall Financial Resource Strain (CARDIA)    Difficulty of Paying Living Expenses: Not hard at all  Food Insecurity: No Food Insecurity (11/04/2021)   Hunger Vital Sign    Worried About Running Out of Food in the Last Year: Never true    Ran Out of Food in the Last Year: Never true  Transportation Needs: No Transportation Needs (11/04/2021)   PRAPARE - Hydrologist (Medical): No    Lack of Transportation (Non-Medical): No  Physical Activity: Sufficiently Active  (11/04/2021)   Exercise Vital Sign    Days of Exercise per Week: 4 days    Minutes of Exercise per Session: 50 min  Stress: No Stress Concern Present (11/04/2021)   [place]    Feeling of Stress : Not at all  Social Connections: Moderately Integrated (11/04/2021)   Social Connection and Isolation Panel [NHANES]    Frequency of Communication with Friends and Family: Three times a week    Frequency of Social Gatherings with Friends and Family: Twice a week    Attends Religious Services: Never    Marine scientist or Organizations: No    Attends Music therapist: More than 4 times per year    Marital Status: Married   Allergies  Allergen Reactions   Bee Venom Anaphylaxis   Iodine Solution [Povidone Iodine] Itching    Patient states his arm was irritated following a previous arm surgery where he had been scrubbed with betadine.   Family History  Problem Relation Age of Onset   Alcohol abuse Mother    Healthy Mother    Alcohol abuse Father    COPD Father        IT trainer, smoker   Arthritis Maternal Grandmother    Cancer Maternal Grandmother        ?abdominal   Breast cancer Sister    Alzheimer's disease Maternal Grandfather    Heart attack Paternal Grandfather      Current Outpatient Medications (Cardiovascular):    EPINEPHrine 0.3 mg/0.3 mL IJ SOAJ injection, Inject 0.3 mLs (0.3 mg total) into the muscle as needed for anaphylaxis.   propranolol (INDERAL) 20 MG tablet, Take 1 tablet (20 mg total) by mouth 2 (two) times daily.  Current Outpatient Medications (Respiratory):    chlorpheniramine-HYDROcodone (TUSSIONEX PENNKINETIC ER) 10-8 MG/5ML, Take 5 mLs by mouth every 12 (twelve) hours as needed for cough.   levocetirizine (XYZAL) 5 MG tablet, Take 5 mg by mouth every evening.  Current Outpatient Medications (Analgesics):    meloxicam (MOBIC) 15 MG tablet, Take 1 tablet (15 mg total) by  mouth daily.   Current Outpatient Medications (Other):    Cholecalciferol (VITAMIN D) 50 MCG (2000 UT) CAPS, Take by mouth.   ciclopirox (PENLAC) 8 % solution, Apply topically at bedtime. Apply over nail and surrounding skin. Apply daily over previous coat. After seven (7) days, may remove with alcohol and continue cycle.   citalopram (CELEXA) 20 MG tablet, Take 1 tablet (20 mg total) by mouth daily.   fish oil-omega-3 fatty acids 1000 MG capsule, Take 1 g by mouth daily.   Glucosamine-Chondroitin (GLUCOSAMINE CHONDR COMPLEX PO), Take 1 tablet by mouth 2 (two) times daily.  influenza vaccine adjuvanted (FLUAD QUADRIVALENT) 0.5 ML injection, Inject into the muscle.   magnesium 30 MG tablet, Take 30 mg by mouth daily.   Multiple Vitamin (MULITIVITAMIN WITH MINERALS) TABS, Take 1 tablet by mouth daily.   Tart Cherry 1200 MG CAPS, Take by mouth.   Turmeric 500 MG TABS, Take by mouth.   Zoster Vaccine Adjuvanted [facility]) injection, Inject into the muscle.   Zoster Vaccine Adjuvanted [facility]) injection, Inject into the muscle.   Objective  Blood pressure 116/78, pulse (!) 59, height '6\' 6"'$  (1.981 m), weight 227 lb (103 kg), SpO2 95 %.   General: No apparent distress alert and oriented x3 mood and affect normal, dressed appropriately.  HEENT: Pupils equal, extraocular movements intact  Respiratory: Patient's speak in full sentences and does not appear short of breath  Cardiovascular: No lower extremity edema, non tender, no erythema  Elbow exam shows significant decrease in the swelling from previous exam.  Improvement in range of motion.  Still has tenderness to palpation  Limited muscular skeletal ultrasound was performed and interpreted by Hulan Saas, M  Limited ultrasound shows the patient does have decrease in the hypoechoic changes noted.  Patient does have still some mild degenerative changes of the common extensor tendon.  Arthritic changes of the elbow look better with less  hypoechoic changes that is consistent with effusion.  Does have what appears to be a cortical defect of the olecranon spur with increasing in Doppler flow. Impression: Interval improvement noted.    Impression and Recommendations:    The above documentation has been reviewed and is accurate and complete Lyndal Pulley, DO

## 2022-01-18 ENCOUNTER — Ambulatory Visit: Payer: Medicare Other | Admitting: Family Medicine

## 2022-01-18 ENCOUNTER — Ambulatory Visit: Payer: Self-pay

## 2022-01-18 VITALS — BP 116/78 | HR 59 | Ht 78.0 in | Wt 227.0 lb

## 2022-01-18 DIAGNOSIS — M25521 Pain in right elbow: Secondary | ICD-10-CM | POA: Diagnosis not present

## 2022-01-18 NOTE — Patient Instructions (Signed)
Does look better Continue Vit D Finish meloxicam Avoid end ranges of motion against resistance Ice at end of day See me in 4-6 weeks

## 2022-01-28 ENCOUNTER — Other Ambulatory Visit (HOSPITAL_BASED_OUTPATIENT_CLINIC_OR_DEPARTMENT_OTHER): Payer: Self-pay

## 2022-01-28 MED ORDER — AREXVY 120 MCG/0.5ML IM SUSR
INTRAMUSCULAR | 0 refills | Status: DC
Start: 2022-01-28 — End: 2022-05-02
  Filled 2022-01-28: qty 0.5, 1d supply, fill #0

## 2022-01-31 ENCOUNTER — Other Ambulatory Visit: Payer: Self-pay | Admitting: Family Medicine

## 2022-01-31 ENCOUNTER — Ambulatory Visit: Payer: Medicare Other | Admitting: Family Medicine

## 2022-02-08 ENCOUNTER — Ambulatory Visit: Payer: Medicare Other | Admitting: Family Medicine

## 2022-02-25 ENCOUNTER — Other Ambulatory Visit (HOSPITAL_BASED_OUTPATIENT_CLINIC_OR_DEPARTMENT_OTHER): Payer: Self-pay

## 2022-02-25 MED ORDER — COMIRNATY 30 MCG/0.3ML IM SUSY
PREFILLED_SYRINGE | INTRAMUSCULAR | 0 refills | Status: DC
  Filled 2022-02-25: qty 0.3, 1d supply, fill #0

## 2022-05-02 ENCOUNTER — Ambulatory Visit (INDEPENDENT_AMBULATORY_CARE_PROVIDER_SITE_OTHER): Payer: Medicare Other | Admitting: Family Medicine

## 2022-05-02 ENCOUNTER — Encounter: Payer: Self-pay | Admitting: Family Medicine

## 2022-05-02 VITALS — BP 98/60 | HR 64 | Temp 97.6°F | Ht 79.5 in | Wt 239.1 lb

## 2022-05-02 DIAGNOSIS — Z13220 Encounter for screening for lipoid disorders: Secondary | ICD-10-CM | POA: Diagnosis not present

## 2022-05-02 DIAGNOSIS — Z125 Encounter for screening for malignant neoplasm of prostate: Secondary | ICD-10-CM

## 2022-05-02 DIAGNOSIS — G252 Other specified forms of tremor: Secondary | ICD-10-CM | POA: Diagnosis not present

## 2022-05-02 DIAGNOSIS — Z0000 Encounter for general adult medical examination without abnormal findings: Secondary | ICD-10-CM | POA: Diagnosis not present

## 2022-05-02 LAB — LIPID PANEL
Cholesterol: 195 mg/dL (ref 0–200)
HDL: 64 mg/dL
LDL Cholesterol: 108 mg/dL — ABNORMAL HIGH (ref 0–99)
NonHDL: 130.81
Total CHOL/HDL Ratio: 3
Triglycerides: 116 mg/dL (ref 0.0–149.0)
VLDL: 23.2 mg/dL (ref 0.0–40.0)

## 2022-05-02 LAB — COMPREHENSIVE METABOLIC PANEL WITH GFR
ALT: 30 U/L (ref 0–53)
AST: 31 U/L (ref 0–37)
Albumin: 4.7 g/dL (ref 3.5–5.2)
Alkaline Phosphatase: 53 U/L (ref 39–117)
BUN: 10 mg/dL (ref 6–23)
CO2: 31 meq/L (ref 19–32)
Calcium: 9.7 mg/dL (ref 8.4–10.5)
Chloride: 100 meq/L (ref 96–112)
Creatinine, Ser: 0.93 mg/dL (ref 0.40–1.50)
GFR: 84.18 mL/min
Glucose, Bld: 84 mg/dL (ref 70–99)
Potassium: 4.6 meq/L (ref 3.5–5.1)
Sodium: 138 meq/L (ref 135–145)
Total Bilirubin: 0.8 mg/dL (ref 0.2–1.2)
Total Protein: 7 g/dL (ref 6.0–8.3)

## 2022-05-02 LAB — PSA: PSA: 1.19 ng/mL (ref 0.10–4.00)

## 2022-05-02 NOTE — Assessment & Plan Note (Signed)
On propranolol 20 mg BID for this, seems to be helping per his report.

## 2022-05-02 NOTE — Progress Notes (Signed)
Complete physical exam  Patient: Jeffrey [name]   DOB: March 26, 1956   67 y.o. Male  MRN: [id number]  Subjective:    Chief Complaint  Patient presents with   Annual Exam    Jeffrey [name] is a 67 y.o. male who presents today for a complete physical exam. He reports consuming a general diet. 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 He generally feels well. He reports sleeping well. He does not have additional problems to discuss today.    Most recent fall risk assessment:    11/04/2021   11:29 AM  [place] in the past year? 0  Number falls in past yr: 0  Injury with Fall? 0  Risk for fall due to : No Fall Risks     Most recent depression screenings:    05/02/2022    9:28 AM 11/10/2021    9:50 AM  PHQ 2/9 Scores  PHQ - 2 Score 0 0  PHQ- 9 Score 0 0    Vision:Within last year and Dental: No current dental problems and Receives regular dental care  Patient Active Problem List   Diagnosis Date Noted   Intention tremor 05/02/2022   Arthritis of right elbow 01/06/2022   Arthritis of first metatarsophalangeal (MTP) joint of left foot 09/21/2021   Lateral epicondylitis of left elbow 09/21/2021   Pulmonary nodule 04/07/2017   Bronchitis 01/29/2016   Chronic knee pain 07/23/2015   Hx of bee sting allergy 07/23/2015   Atrial fibrillation ([place]) 07/23/2015   GAD (generalized anxiety disorder) 07/23/2015   Rhinitis, allergic 07/23/2015      Patient Care Team: Farrel Conners, MD as PCP - General (Family Medicine)   Outpatient Medications Prior to Visit  Medication Sig   Cholecalciferol (VITAMIN D) 50 MCG (2000 UT) CAPS Take by mouth.   citalopram (CELEXA) 20 MG tablet Take 1 tablet (20 mg total) by mouth daily.   EPINEPHrine 0.3 mg/0.3 mL IJ SOAJ injection Inject 0.3 mLs (0.3 mg total) into the muscle as needed for anaphylaxis.   fish oil-omega-3 fatty acids 1000 MG capsule Take 1 g by mouth daily.    Glucosamine-Chondroitin (GLUCOSAMINE CHONDR COMPLEX PO) Take 1 tablet by mouth 2 (two) times daily.   levocetirizine (XYZAL) 5 MG tablet Take 5 mg by mouth every evening.   magnesium 30 MG tablet Take 30 mg by mouth daily.   meloxicam (MOBIC) 15 MG tablet TAKE 1 TABLET BY MOUTH DAILY   Multiple Vitamin (MULITIVITAMIN WITH MINERALS) TABS Take 1 tablet by mouth daily.   propranolol (INDERAL) 20 MG tablet Take 1 tablet (20 mg total) by mouth 2 (two) times daily.   Tart Cherry 1200 MG CAPS Take by mouth.   Turmeric 500 MG TABS Take by mouth.   [DISCONTINUED] COVID-19 mRNA vaccine 2023-2024 (COMIRNATY) syringe Inject into the muscle.   [DISCONTINUED] influenza vaccine adjuvanted (FLUAD QUADRIVALENT) 0.5 ML injection Inject into the muscle.   [DISCONTINUED] RSV vaccine recomb adjuvanted (AREXVY) 120 MCG/0.5ML injection Inject into the muscle.   [DISCONTINUED] Zoster Vaccine Adjuvanted [facility]) injection Inject into the muscle.   [DISCONTINUED] Zoster Vaccine Adjuvanted [facility]) injection Inject into the muscle.   [DISCONTINUED] chlorpheniramine-HYDROcodone (TUSSIONEX PENNKINETIC ER) 10-8 MG/5ML Take 5 mLs by mouth every 12 (twelve) hours as needed for cough.   [DISCONTINUED] ciclopirox (PENLAC) 8 % solution Apply topically at bedtime. Apply over nail and surrounding skin. Apply daily over previous coat. After seven (7) days,  may remove with alcohol and continue cycle.   No facility-administered medications prior to visit.    Review of Systems  HENT:  Negative for hearing loss.   Eyes:  Negative for blurred vision.  Respiratory:  Negative for shortness of breath.   Cardiovascular:  Negative for chest pain.  Gastrointestinal: Negative.   Genitourinary: Negative.   Musculoskeletal:  Negative for back pain.  Neurological:  Negative for headaches.  Psychiatric/Behavioral:  Negative for depression.           Objective:     BP 98/60 (BP Location: Right Arm, Patient Position: Sitting,  Cuff Size: Large)   Pulse 64   Temp 97.6 F (36.4 C) (Oral)   Ht 6' 7.5" (2.019 m)   Wt 239 lb 1.6 oz (108.5 kg)   SpO2 98%   BMI 26.60 kg/m    Physical Exam Vitals reviewed.  Constitutional:      Appearance: Normal appearance. He is well-groomed and normal weight.  HENT:     Right Ear: Tympanic membrane and ear canal normal.     Left Ear: Tympanic membrane and ear canal normal.     Mouth/Throat:     Mouth: Mucous membranes are moist.     Pharynx: No oropharyngeal exudate or posterior oropharyngeal erythema.  Eyes:     Extraocular Movements: Extraocular movements intact.     Conjunctiva/sclera: Conjunctivae normal.  Neck:     Thyroid: No thyromegaly.  Cardiovascular:     Rate and Rhythm: Normal rate and regular rhythm.     Heart sounds: S1 normal and S2 normal. No murmur heard. Pulmonary:     Effort: Pulmonary effort is normal.     Breath sounds: Normal breath sounds and air entry. No rales.  Abdominal:     General: Abdomen is flat. Bowel sounds are normal.  Musculoskeletal:     Right lower leg: No edema.     Left lower leg: No edema.  Lymphadenopathy:     Cervical: No cervical adenopathy.  Neurological:     General: No focal deficit present.     Mental Status: He is alert and oriented to person, place, and time.     Gait: Gait is intact.  Psychiatric:        Mood and Affect: Mood and affect normal.      No results found for any visits on 05/02/22.     Assessment & Plan:    Routine Health Maintenance and Physical Exam  Immunization History  Administered Date(s) Administered   COVID-19, mRNA, vaccine(Comirnaty)12 years and older 02/25/2022   Fluad Quad(high Dose 65+) 01/24/2019   Influenza Split 02/16/2011, 01/20/2012   Influenza Whole 02/04/2010   Influenza, High Dose Seasonal PF 01/17/2020   Influenza,inj,Quad PF,6+ Mos 01/17/2014, 02/19/2015, 01/18/2016, 01/17/2017, 01/15/2018   Influenza-Unspecified 01/15/2021, 12/24/2021   PFIZER(Purple  Top)SARS-COV-2 Vaccination 05/09/2019, 05/27/2019, 02/07/2020, 01/15/2021   PNEUMOCOCCAL CONJUGATE-20 04/28/2021   Pfizer Covid-19 Vaccine Bivalent Booster 93yr & up 02/21/2022   Pneumococcal Polysaccharide-23 01/24/2019   Respiratory Syncytial Virus Vaccine,Recomb Aduvanted(Arexvy) 01/28/2022   Td 04/26/2007   Tdap 04/25/2017   Unspecified SARS-COV-2 Vaccination 01/16/2021   Zoster Recombinat (Shingrix) 03/10/2021, 05/10/2021   Zoster, Live 08/04/2014    Health Maintenance  Topic Date Due   COVID-19 Vaccine (7 - 2023-24 season) 04/22/2022   Medicare Annual Wellness (AWV)  11/05/2022   COLONOSCOPY (Pts 45-450yrInsurance coverage will need to be confirmed)  09/03/2023   DTaP/Tdap/Td (3 - Td or Tdap) 04/26/2027   Pneumonia Vaccine 6559Years old  Completed   INFLUENZA VACCINE  Completed   Hepatitis C Screening  Completed   Zoster Vaccines- Shingrix  Completed   HPV VACCINES  Aged Out    Discussed health benefits of physical activity, and encouraged him to engage in regular exercise appropriate for his age and condition.  Problem List Items Addressed This Visit       Unprioritized   Intention tremor (Chronic)    On propranolol 20 mg BID for this, seems to be helping per his report.       Other Visit Diagnoses     Healthy adult on routine physical examination    -  Primary   Relevant Orders   Normal physical exam findings today, he is UTD on his HM measures. Will order his annual bloodwork today, including CMP, lipid panel and PSA.  CMP   Lipid screening       Relevant Orders   Lipid Panel   Prostate cancer screening       Relevant Orders   PSA      Return in about 1 year (around 05/03/2023) for Annual Physical Exam.     Farrel Conners, MD

## 2022-06-15 DIAGNOSIS — H25013 Cortical age-related cataract, bilateral: Secondary | ICD-10-CM | POA: Diagnosis not present

## 2022-07-11 ENCOUNTER — Other Ambulatory Visit: Payer: Self-pay | Admitting: *Deleted

## 2022-07-11 DIAGNOSIS — F411 Generalized anxiety disorder: Secondary | ICD-10-CM

## 2022-07-11 MED ORDER — CITALOPRAM HYDROBROMIDE 20 MG PO TABS: 20.0000 mg | ORAL_TABLET | Freq: Every day | ORAL | 1 refills | Status: DC

## 2022-08-02 ENCOUNTER — Other Ambulatory Visit: Payer: Self-pay | Admitting: *Deleted

## 2022-08-02 MED ORDER — PROPRANOLOL HCL 20 MG PO TABS: 20.0000 mg | ORAL_TABLET | Freq: Two times a day (BID) | ORAL | 1 refills | Status: DC

## 2022-10-13 ENCOUNTER — Other Ambulatory Visit: Payer: Self-pay | Admitting: Family Medicine

## 2022-11-29 ENCOUNTER — Other Ambulatory Visit: Payer: Self-pay | Admitting: Family Medicine

## 2022-11-29 DIAGNOSIS — F411 Generalized anxiety disorder: Secondary | ICD-10-CM

## 2023-01-10 ENCOUNTER — Ambulatory Visit (INDEPENDENT_AMBULATORY_CARE_PROVIDER_SITE_OTHER): Payer: Medicare Other | Admitting: Family Medicine

## 2023-01-10 ENCOUNTER — Encounter: Payer: Self-pay | Admitting: Family Medicine

## 2023-01-10 DIAGNOSIS — Z0000 Encounter for general adult medical examination without abnormal findings: Secondary | ICD-10-CM

## 2023-01-10 NOTE — Patient Instructions (Signed)
I really enjoyed getting to talk with you today! I am available on Tuesdays and Thursdays for virtual visits if you have any questions or concerns, or if I can be of any further assistance.   CHECKLIST FROM ANNUAL WELLNESS VISIT:  -Follow up (please call to schedule if not scheduled after visit):   -yearly for annual wellness visit with primary care office  Here is a list of your preventive care/health maintenance measures and the plan for each if any are due:  PLAN For any measures below that may be due: -can get the updated flu and covid vaccines at the pharmacy - please let us know if you do so that we can update them in your record.  Health Maintenance  Topic Date Due   COVID-19 Vaccine (7 - 2023-24 season) 01/26/2023 (Originally 12/25/2022)   INFLUENZA VACCINE  07/24/2023 (Originally 11/24/2022)   Colonoscopy  09/03/2023   Medicare Annual Wellness (AWV)  01/10/2024   DTaP/Tdap/Td (3 - Td or Tdap) 04/26/2027   Pneumonia Vaccine 13+ Years old  Completed   Hepatitis C Screening  Completed   Zoster Vaccines- Shingrix  Completed   HPV VACCINES  Aged Out    -See a dentist at least yearly  -Get your eyes checked and then per your eye specialist's recommendations  -Other issues addressed today: please cut back on alcohol to no more than 1-2 drinks per 24 hours   -I have included below further information regarding a healthy whole foods based diet, physical activity guidelines for adults, stress management and opportunities for social connections. I hope you find this information useful.   -----------------------------------------------------------------------------------------------------------------------------------------------------------------------------------------------------------------------------------------------------------  NUTRITION: -eat real food: lots of colorful vegetables (half the plate) and fruits -5-7 servings of vegetables and fruits per day (fresh or steamed is  best), exp. 2 servings of vegetables with lunch and dinner and 2 servings of fruit per day. Berries and greens such as kale and collards are great choices.  -consume on a regular basis: whole grains (make sure first ingredient on label contains the word "whole"), fresh fruits, fish, nuts, seeds, healthy oils (such as olive oil, avocado oil, grape seed oil) -may eat small amounts of dairy and lean meat on occasion, but avoid processed meats such as ham, bacon, lunch meat, etc. -drink water -try to avoid fast food and pre-packaged foods, processed meat -most experts advise limiting sodium to < 2300mg  per day, should limit further is any chronic conditions such as high blood pressure, heart disease, diabetes, etc. The American Heart Association advised that < 1500mg  is is ideal -try to avoid foods that contain any ingredients with names you do not recognize  -try to avoid sugar/sweets (except for the natural sugar that occurs in fresh fruit) -try to avoid sweet drinks -try to avoid white rice, white bread, pasta (unless whole grain), white or yellow potatoes  EXERCISE GUIDELINES FOR ADULTS: -if you wish to increase your physical activity, do so gradually and with the approval of your doctor -STOP and seek medical care immediately if you have any chest pain, chest discomfort or trouble breathing when starting or increasing exercise  -move and stretch your body, legs, feet and arms when sitting for long periods -Physical activity guidelines for optimal health in adults: -least 150 minutes per week of aerobic exercise (can talk, but not sing) once approved by your doctor, 20-30 minutes of sustained activity or two 10 minute episodes of sustained activity every day.  -resistance training at least 2 days per week if approved by  your doctor -balance exercises 3+ days per week:   Stand somewhere where you have something sturdy to hold onto if you lose balance.    1) lift up on toes, start with 5x per day  and work up to 20x   2) stand and lift on leg straight out to the side so that foot is a few inches of the floor, start with 5x each side and work up to 20x each side   3) stand on one foot, start with 5 seconds each side and work up to 20 seconds on each side  If you need ideas or help with getting more active:  -Silver sneakers https://tools.silversneakers.com  -Walk with a Doc: http://www.duncan-williams.com/  -try to include resistance (weight lifting/strength building) and balance exercises twice per week: or the following link for ideas: http://castillo-powell.com/  BuyDucts.dk  STRESS MANAGEMENT: -can try meditating, or just sitting quietly with deep breathing while intentionally relaxing all parts of your body for 5 minutes daily -if you need further help with stress, anxiety or depression please follow up with your primary doctor or contact the wonderful folks at [facility]: [phone]  SOCIAL CONNECTIONS: -options in [place] if you wish to engage in more social and exercise related activities:  -Silver sneakers https://tools.silversneakers.com  -Walk with a Doc: http://www.duncan-williams.com/  -Check out the [employer] [facility] Active Adults 50+ section on the [place] of [employer] (hiking clubs, book clubs, cards and games, chess, exercise classes, aquatic classes and much more) - see the website for details: https://www.[place].gov/departments/parks-recreation/active-adults50  -YouTube has lots of exercise videos for different ages and abilities as well  -[facility] (a variety of indoor and outdoor inperson activities for adults). [phone]. [address]  -Virtual Online Classes (a variety of topics): see seniorplanet.org or call ([phone]) [phone]  -consider volunteering at a school, hospice center, church, senior center or  elsewhere

## 2023-01-10 NOTE — Progress Notes (Signed)
PATIENT CHECK-IN and HEALTH RISK ASSESSMENT QUESTIONNAIRE:  -completed by phone/video for upcoming Medicare Preventive Visit  Pre-Visit Check-in: 1)Vitals (height, wt, BP, etc) - record in vitals section for visit on day of visit Request home vitals (wt, BP, etc.) and enter into vitals, THEN update Vital Signs SmartPhrase below at the top of the HPI. See below.  2)Review and Update Medications, Allergies PMH, Surgeries, Social history in Epic 3)Hospitalizations in the last year with date/reason? NO   4)Review and Update Care Team (patient's specialists) in Epic 5) Complete PHQ9 in Epic  6) Complete Fall Screening in Epic 7)Review all Health Maintenance Due and order under PCP if not done.  Medicare Wellness Patient Questionnaire:  Answer theses question about your habits: Do you drink alcohol? yes If yes, how many drinks do you have a day? 3 drinks daily Have you ever smoked? no  Quit date if applicable? 35 years ago   How many packs a day do/did you smoke? 2 Do you use smokeless tobacco? No  Do you use an illicit drugs?No  Do you exercises? Yes IF so, what type and how many days/minutes per week?4x per week for 1 hour - goes to [employer], has a trainer, does strength training and cardio and stretching Are you sexually active? NO Number of partners?0 Typical breakfast: Oatmeal toast  Typical lunch: sandwich, soda Typical dinner: Chicken Veg and pototas  Typical snacks:NA  Beverages: Soda, water, Gatorade   Answer theses question about you: Can you perform most household chores?Yes  Do you find it hard to follow a conversation in a noisy room?NO  Do you often ask people to speak up or repeat themselves?NO  Do you feel that you have a problem with memory?NO  Do you balance your checkbook and or bank accounts? Yes  Do you feel safe at home?Yes Last dentist visit? July 2024 Do you need assistance with any of the following: Please note if so   Driving? No  Feeding yourself?  NO  Getting from bed to chair? No  Getting to the toilet?No   Bathing or showering?No   Dressing yourself?No   Managing money?No   Climbing a flight of stairs? NO   Preparing meals? NO    Do you have Advanced Directives in place (Living Will, Healthcare Power or Attorney)? YES   Last eye Exam and location? Feb 2024 [facility]   Do you currently use prescribed or non-prescribed narcotic or opioid pain medications?NO   Do you have a history or close family history of breast, ovarian, tubal or peritoneal cancer or a family member with BRCA (breast cancer susceptibility 1 and 2) gene mutations? YES  NO Request home vitals (wt, BP, etc.) and enter into vitals, THEN update Vital Signs SmartPhrase below at the top of the HPI. See below.   Nurse/Assistant Credentials/time stamp: Stann Ore    ----------------------------------------------------------------------------------------------------------------------------------------------------------------------------------------------------------------------  Vital Signs: Unable to obtain new vitals due to this being a telehealth visit.   MEDICARE ANNUAL PREVENTIVE CARE VISIT WITH PROVIDER (Welcome to Medicare, initial annual wellness or annual wellness exam)  Virtual Visit via Phone Note  I connected with Jeffrey [name] on 01/10/23  by phone and verified that I am speaking with the correct person using two identifiers.  Location patient: home Location provider:work or home office Persons participating in the virtual visit: patient, provider  Concerns and/or follow up today: no concerns, reports is doing "doing very well"   See HM section in Epic for other details of  completed HM.    ROS: negative for report of fevers, unintentional weight loss, vision changes, vision loss, hearing loss or change, chest pain, sob, hemoptysis, melena, hematochezia, hematuria, falls, bleeding or bruising, thoughts of suicide or self  harm, memory loss  Patient-completed extensive health risk assessment - reviewed and discussed with the patient: See Health Risk Assessment completed with patient prior to the visit either above or in recent phone note. This was reviewed in detailed with the patient today and appropriate recommendations, orders and referrals were placed as needed per Summary below and patient instructions.   Review of Medical History: -PMH, PSH, Family History and current specialty and care providers reviewed and updated and listed below   Patient Care Team: Karie Georges, MD as PCP - General (Family Medicine)   Past Medical History:  Diagnosis Date   Abnormal Doppler ultrasound of carotid artery 07-06-2012   normal carotid doppler   Acute appendicitis 03/30/2017   AF (atrial fibrillation) (HCC)    Appendicitis, acute 03/30/2017   Chronic knee pain 07/23/2015   H/O echocardiogram 06-26-2012   Transthoracic echo    EF 55-60%   H/O prior ablation treatment 2011   History of atrial flutter    History of cardiac monitoring 06-15-2012   sinus    Hx of bee sting allergy 07/23/2015   Normal cardiac stress test 05-24-2007   low risk scan    Past Surgical History:  Procedure Laterality Date   ATRIAL FIBRILLATION ABLATION     LAPAROSCOPIC APPENDECTOMY N/A 03/30/2017   Procedure: APPENDECTOMY LAPAROSCOPIC;  Surgeon: Darnell Level, MD;  Location: [facility];  Service: General;  Laterality: N/A;   left testicle removed (no cancer, benign mass)     othroscopic knee surgery bil     vericose veins stripped both legs      Social History   Socioeconomic History   Marital status: Married    Spouse name: Not on file   Number of children: Not on file   Years of education: Not on file   Highest education level: Associate degree: occupational, Scientist, product/process development, or vocational program  Occupational History   Occupation: Production designer, theatre/television/film  Tobacco Use   Smoking status: Former    Current packs/day: 0.00    Average packs/day: 1.5  packs/day for 8.0 years (12.0 ttl pk-yrs)    Types: Cigarettes    Start date: 07/11/1969    Quit date: 07/11/1977    Years since quitting: 45.5   Smokeless tobacco: Never  Substance and Sexual Activity   Alcohol use: Yes    Alcohol/week: 21.0 standard drinks of alcohol    Types: 21 Standard drinks or equivalent per week   Drug use: No   Sexual activity: Not on file  Other Topics Concern   Not on file  Social History Narrative   Work or School: [employer], 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      Home Situation: lives with wife      Spiritual Beliefs: none      Lifestyle: gym 3 days per week; diet is healthy      Social Determinants of Health   Financial Resource Strain: Low Risk  (11/04/2021)   Overall Financial Resource Strain (CARDIA)    Difficulty of Paying Living Expenses: Not hard at all  Food Insecurity: No Food Insecurity (11/04/2021)   Hunger Vital Sign    Worried About Running Out of Food in the Last Year: Never true    Ran Out of Food in the Last Year: Never true  Transportation Needs:  No Transportation Needs (11/04/2021)   PRAPARE - Administrator, Civil Service (Medical): No    Lack of Transportation (Non-Medical): No  Physical Activity: Sufficiently Active (11/04/2021)   Exercise Vital Sign    Days of Exercise per Week: 4 days    Minutes of Exercise per Session: 50 min  Stress: No Stress Concern Present (11/04/2021)   [employer] of Occupational Health - Occupational Stress Questionnaire    Feeling of Stress : Not at all  Social Connections: Moderately Integrated (11/04/2021)   Social Connection and Isolation Panel [NHANES]    Frequency of Communication with Friends and Family: Three times a week    Frequency of Social Gatherings with Friends and Family: Twice a week    Attends Religious Services: Never    Database administrator or Organizations: No    Attends Engineer, structural: More than 4 times per year    Marital Status: Married  Catering manager  Violence: Not At Risk (11/04/2021)   Humiliation, Afraid, Rape, and Kick questionnaire    Fear of Current or Ex-Partner: No    Emotionally Abused: No    Physically Abused: No    Sexually Abused: No    Family History  Problem Relation Age of Onset   Alcohol abuse Mother    Healthy Mother    Alcohol abuse Father    COPD Father        Theatre stage manager, smoker   Arthritis Maternal Grandmother    Cancer Maternal Grandmother        ?abdominal   Breast cancer Sister    Alzheimer's disease Maternal Grandfather    Heart attack Paternal Grandfather     Current Outpatient Medications on File Prior to Visit  Medication Sig Dispense Refill   Cholecalciferol (VITAMIN D) 50 MCG (2000 UT) CAPS Take by mouth.     citalopram (CELEXA) 20 MG tablet TAKE 1 TABLET BY MOUTH DAILY 90 tablet 1   EPINEPHrine 0.3 mg/0.3 mL IJ SOAJ injection Inject 0.3 mLs (0.3 mg total) into the muscle as needed for anaphylaxis. 1 each 2   fish oil-omega-3 fatty acids 1000 MG capsule Take 1 g by mouth daily.     Glucosamine-Chondroitin (GLUCOSAMINE CHONDR COMPLEX PO) Take 1 tablet by mouth 2 (two) times daily.     levocetirizine (XYZAL) 5 MG tablet Take 5 mg by mouth every evening.     magnesium 30 MG tablet Take 30 mg by mouth daily.     Multiple Vitamin (MULITIVITAMIN WITH MINERALS) TABS Take 1 tablet by mouth daily.     propranolol (INDERAL) 20 MG tablet TAKE 1 TABLET BY MOUTH TWICE  DAILY 200 tablet 2   Tart Cherry 1200 MG CAPS Take by mouth.     Turmeric 500 MG TABS Take by mouth.     No current facility-administered medications on file prior to visit.    Allergies  Allergen Reactions   Bee Venom Anaphylaxis   Iodine Solution [Povidone Iodine] Itching    Patient states his arm was irritated following a previous arm surgery where he had been scrubbed with betadine.       Physical Exam Vitals requested from patient and listed below if patient had equipment and was able to obtain at home for this virtual  visit: There were no vitals filed for this visit. Estimated body mass index is 26.6 kg/m as calculated from the following:   Height as of 05/02/22: 6' 7.5" (2.019 m).   Weight as of 05/02/22:  239 lb 1.6 oz (108.5 kg).  EKG (optional): deferred due to virtual visit  GENERAL: alert, oriented, no acute distress detected; full vision exam deferred due to pandemic and/or virtual encounter  PSYCH/NEURO: pleasant and cooperative, no obvious depression or anxiety, speech and thought processing grossly intact, Cognitive function grossly intact  Flowsheet Row Office Visit from 01/10/2023 in [facility] [place]  PHQ-9 Total Score 0           01/10/2023    9:30 AM 05/02/2022    9:28 AM 11/10/2021    9:50 AM 11/04/2021   11:25 AM 04/28/2021   10:10 AM  Depression screen PHQ 2/9  Decreased Interest 0 0 0 0 0  Down, Depressed, Hopeless 0 0 0 0 0  PHQ - 2 Score 0 0 0 0 0  Altered sleeping 0 0 0  0  Tired, decreased energy 0 0 0  0  Change in appetite 0 0 0  0  Feeling bad or failure about yourself  0 0 0  0  Trouble concentrating 0 0 0  0  Moving slowly or fidgety/restless 0 0 0  0  Suicidal thoughts 0 0 0  0  PHQ-9 Score 0 0 0  0  Difficult doing work/chores Not difficult at all           04/28/2021   10:10 AM 11/03/2021    3:32 PM 11/04/2021   11:29 AM 01/06/2023   12:52 PM 01/10/2023    9:31 AM  Fall Risk  Falls in the past year? 0 0 0 0 0  Was there an injury with Fall?  0 0  0  Fall Risk Category Calculator   0  0  Fall Risk Category (Retired)   Low    (RETIRED) Patient Fall Risk Level   Low fall risk    Patient at Risk for Falls Due to   No Fall Risks  No Fall Risks  Fall risk Follow up     Falls evaluation completed     SUMMARY AND PLAN:  Encounter for Medicare annual wellness exam   Discussed applicable health maintenance/preventive health measures and advised and referred or ordered per patient preferences: -advised of vaccines due per [facility]  guidelines  Health Maintenance  Topic Date Due   COVID-19 Vaccine (7 - 2023-24 season) 01/26/2023 (Originally 12/25/2022)   INFLUENZA VACCINE  07/24/2023 (Originally 11/24/2022)   Colonoscopy  09/03/2023   Medicare Annual Wellness (AWV)  01/10/2024   DTaP/Tdap/Td (3 - Td or Tdap) 04/26/2027   Pneumonia Vaccine 33+ Years old  Completed   Hepatitis C Screening  Completed   Zoster Vaccines- Shingrix  Completed   HPV VACCINES  Aged Out     Education and counseling on the following was provided based on the above review of health and a plan/checklist for the patient, along with additional information discussed, was provided for the patient in the patient instructions :   -Provided safe balance exercises that can be done at home to improve balance and discussed exercise guidelines for adults with include balance exercises at least 3 days per week.  -Advised and counseled on a healthy lifestyle - including the importance of a healthy diet, regular physical activity -Reviewed patient's current diet. Advised and counseled on a whole foods based healthy diet. A summary of a healthy diet was provided in the Patient Instructions.  -reviewed patient's current physical activity level, congratulated on the regular exericse and discussed exercise guidelines for adults. Encouraged to  ask trainer to incorporate some balance exercises 3 days per week.  -Advise yearly dental visits at minimum and regular eye exams -Advised and counseled on alcohol safe limits, risks and advised to cut back to no more than 1-2 drinks per day Follow up: see patient instructions   Patient Instructions  I really enjoyed getting to talk with you today! I am available on Tuesdays and Thursdays for virtual visits if you have any questions or concerns, or if I can be of any further assistance.   CHECKLIST FROM ANNUAL WELLNESS VISIT:  -Follow up (please call to schedule if not scheduled after visit):   -yearly for annual wellness  visit with primary care office  Here is a list of your preventive care/health maintenance measures and the plan for each if any are due:  PLAN For any measures below that may be due: -can get the updated flu and covid vaccines at the pharmacy - please let us know if you do so that we can update them in your record.  Health Maintenance  Topic Date Due   COVID-19 Vaccine (7 - 2023-24 season) 01/26/2023 (Originally 12/25/2022)   INFLUENZA VACCINE  07/24/2023 (Originally 11/24/2022)   Colonoscopy  09/03/2023   Medicare Annual Wellness (AWV)  01/10/2024   DTaP/Tdap/Td (3 - Td or Tdap) 04/26/2027   Pneumonia Vaccine 68+ Years old  Completed   Hepatitis C Screening  Completed   Zoster Vaccines- Shingrix  Completed   HPV VACCINES  Aged Out    -See a dentist at least yearly  -Get your eyes checked and then per your eye specialist's recommendations  -Other issues addressed today: please cut back on alcohol to no more than 1-2 drinks per 24 hours   -I have included below further information regarding a healthy whole foods based diet, physical activity guidelines for adults, stress management and opportunities for social connections. I hope you find this information useful.   -----------------------------------------------------------------------------------------------------------------------------------------------------------------------------------------------------------------------------------------------------------  NUTRITION: -eat real food: lots of colorful vegetables (half the plate) and fruits -5-7 servings of vegetables and fruits per day (fresh or steamed is best), exp. 2 servings of vegetables with lunch and dinner and 2 servings of fruit per day. Berries and greens such as kale and collards are great choices.  -consume on a regular basis: whole grains (make sure first ingredient on label contains the word "whole"), fresh fruits, fish, nuts, seeds, healthy oils (such as olive oil,  avocado oil, grape seed oil) -may eat small amounts of dairy and lean meat on occasion, but avoid processed meats such as ham, bacon, lunch meat, etc. -drink water -try to avoid fast food and pre-packaged foods, processed meat -most experts advise limiting sodium to < 2300mg  per day, should limit further is any chronic conditions such as high blood pressure, heart disease, diabetes, etc. The American Heart Association advised that < 1500mg  is is ideal -try to avoid foods that contain any ingredients with names you do not recognize  -try to avoid sugar/sweets (except for the natural sugar that occurs in fresh fruit) -try to avoid sweet drinks -try to avoid white rice, white bread, pasta (unless whole grain), white or yellow potatoes  EXERCISE GUIDELINES FOR ADULTS: -if you wish to increase your physical activity, do so gradually and with the approval of your doctor -STOP and seek medical care immediately if you have any chest pain, chest discomfort or trouble breathing when starting or increasing exercise  -move and stretch your body, legs, feet and arms when sitting for long  periods -Physical activity guidelines for optimal health in adults: -least 150 minutes per week of aerobic exercise (can talk, but not sing) once approved by your doctor, 20-30 minutes of sustained activity or two 10 minute episodes of sustained activity every day.  -resistance training at least 2 days per week if approved by your doctor -balance exercises 3+ days per week:   Stand somewhere where you have something sturdy to hold onto if you lose balance.    1) lift up on toes, start with 5x per day and work up to 20x   2) stand and lift on leg straight out to the side so that foot is a few inches of the floor, start with 5x each side and work up to 20x each side   3) stand on one foot, start with 5 seconds each side and work up to 20 seconds on each side  If you need ideas or help with getting more active:  -Silver  sneakers https://tools.silversneakers.com  -Walk with a Doc: http://www.duncan-williams.com/  -try to include resistance (weight lifting/strength building) and balance exercises twice per week: or the following link for ideas: http://castillo-powell.com/  BuyDucts.dk  STRESS MANAGEMENT: -can try meditating, or just sitting quietly with deep breathing while intentionally relaxing all parts of your body for 5 minutes daily -if you need further help with stress, anxiety or depression please follow up with your primary doctor or contact the wonderful folks at [facility]: [phone]  SOCIAL CONNECTIONS: -options in [place] if you wish to engage in more social and exercise related activities:  -Silver sneakers https://tools.silversneakers.com  -Walk with a Doc: http://www.duncan-williams.com/  -Check out the [facility] Active Adults 50+ section on the [place] [employer] (hiking clubs, book clubs, cards and games, chess, exercise classes, aquatic classes and much more) - see the website for details: https://www.[place].gov/departments/parks-recreation/active-adults50  -YouTube has lots of exercise videos for different ages and abilities as well  -[facility] (a variety of indoor and outdoor inperson activities for adults). 5858815644. [address]  -Virtual Online Classes (a variety of topics): see seniorplanet.org or call ([phone]) [phone]  -consider volunteering at a school, hospice center, church, senior center or elsewhere           Terressa Koyanagi, DO

## 2023-01-10 NOTE — Progress Notes (Signed)
Patient was unable to self-report due to a lack of equipment at home via telehealth

## 2023-02-21 NOTE — Progress Notes (Unsigned)
[name] [address] [place] [address] Phone: [phone] Subjective:   [name], am serving as a scribe for Dr. Antoine Primas.\  I'm seeing this patient by the request  of:  Karie Georges, MD  CC: He right hip, knee pain foot pain  [id number]  ROMALE Jeffrey [name] is a 67 y.o. male coming in with complaint of L foot pain over medial longitudinal arch. Using OOFOS and HOKA sneakers which was helpful but his pain has flared up. Pain increases with weightbearing but is still present at rest.  R hip pain over lateral aspect. Uses Voltaren gel. When he sits with legs crossed his pain increases. Does not have an increase in pain with activity. Denies any radiating symptoms.      Past Medical History:  Diagnosis Date   Abnormal Doppler ultrasound of carotid artery 07-06-2012   normal carotid doppler   Acute appendicitis 03/30/2017   AF (atrial fibrillation) (HCC)    Appendicitis, acute 03/30/2017   Chronic knee pain 07/23/2015   H/O echocardiogram 06-26-2012   Transthoracic echo    EF 55-60%   H/O prior ablation treatment 2011   History of atrial flutter    History of cardiac monitoring 06-15-2012   sinus    Hx of bee sting allergy 07/23/2015   Normal cardiac stress test 05-24-2007   low risk scan   Past Surgical History:  Procedure Laterality Date   ATRIAL FIBRILLATION ABLATION     LAPAROSCOPIC APPENDECTOMY N/A 03/30/2017   Procedure: APPENDECTOMY LAPAROSCOPIC;  Surgeon: Darnell Level, MD;  Location: [facility];  Service: General;  Laterality: N/A;   left testicle removed (no cancer, benign mass)     othroscopic knee surgery bil     vericose veins stripped both legs     Social History   Socioeconomic History   Marital status: Married    Spouse name: Not on file   Number of children: Not on file   Years of education: Not on file   Highest education level: Associate degree: occupational, Scientist, product/process development, or vocational program   Occupational History   Occupation: Production designer, theatre/television/film  Tobacco Use   Smoking status: Former    Current packs/day: 0.00    Average packs/day: 1.5 packs/day for 8.0 years (12.0 ttl pk-yrs)    Types: Cigarettes    Start date: 07/11/1969    Quit date: 07/11/1977    Years since quitting: 45.6   Smokeless tobacco: Never  Substance and Sexual Activity   Alcohol use: Yes    Alcohol/week: 21.0 standard drinks of alcohol    Types: 21 Standard drinks or equivalent per week   Drug use: No   Sexual activity: Not on file  Other Topics Concern   Not on file  Social History Narrative   Work or School: [employer], 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      Home Situation: lives with wife      Spiritual Beliefs: none      Lifestyle: gym 3 days per week; diet is healthy      Social Determinants of Health   Financial Resource Strain: Low Risk  (11/04/2021)   Overall Financial Resource Strain (CARDIA)    Difficulty of Paying Living Expenses: Not hard at all  Food Insecurity: No Food Insecurity (11/04/2021)   Hunger Vital Sign    Worried About Running Out of Food in the Last Year: Never true    Ran Out of Food in the Last Year: Never true  Transportation Needs: No  Transportation Needs (11/04/2021)   PRAPARE - Administrator, Civil Service (Medical): No    Lack of Transportation (Non-Medical): No  Physical Activity: Sufficiently Active (11/04/2021)   Exercise Vital Sign    Days of Exercise per Week: 4 days    Minutes of Exercise per Session: 50 min  Stress: No Stress Concern Present (11/04/2021)   [employer] of Occupational Health - Occupational Stress Questionnaire    Feeling of Stress : Not at all  Social Connections: Moderately Integrated (11/04/2021)   Social Connection and Isolation Panel [NHANES]    Frequency of Communication with Friends and Family: Three times a week    Frequency of Social Gatherings with Friends and Family: Twice a week    Attends Religious Services: Never    Database administrator or  Organizations: No    Attends Engineer, structural: More than 4 times per year    Marital Status: Married   Allergies  Allergen Reactions   Bee Venom Anaphylaxis   Iodine Solution [Povidone Iodine] Itching    Patient states his arm was irritated following a previous arm surgery where he had been scrubbed with betadine.   Family History  Problem Relation Age of Onset   Alcohol abuse Mother    Healthy Mother    Alcohol abuse Father    COPD Father        Theatre stage manager, smoker   Arthritis Maternal Grandmother    Cancer Maternal Grandmother        ?abdominal   Breast cancer Sister    Alzheimer's disease Maternal Grandfather    Heart attack Paternal Grandfather      Current Outpatient Medications (Cardiovascular):    EPINEPHrine 0.3 mg/0.3 mL IJ SOAJ injection, Inject 0.3 mLs (0.3 mg total) into the muscle as needed for anaphylaxis.   propranolol (INDERAL) 20 MG tablet, TAKE 1 TABLET BY MOUTH TWICE  DAILY  Current Outpatient Medications (Respiratory):    levocetirizine (XYZAL) 5 MG tablet, Take 5 mg by mouth every evening.    Current Outpatient Medications (Other):    Cholecalciferol (VITAMIN D) 50 MCG (2000 UT) CAPS, Take by mouth.   citalopram (CELEXA) 20 MG tablet, TAKE 1 TABLET BY MOUTH DAILY   fish oil-omega-3 fatty acids 1000 MG capsule, Take 1 g by mouth daily.   Glucosamine-Chondroitin (GLUCOSAMINE CHONDR COMPLEX PO), Take 1 tablet by mouth 2 (two) times daily.   Multiple Vitamin (MULITIVITAMIN WITH MINERALS) TABS, Take 1 tablet by mouth daily.   Tart Cherry 1200 MG CAPS, Take by mouth.   Turmeric 500 MG TABS, Take by mouth.   Reviewed prior external information including notes and imaging from  primary care provider As well as notes that were available from care everywhere and other healthcare systems.  Past medical history, social, surgical and family history all reviewed in electronic medical record.  No pertanent information unless stated regarding to the  chief complaint.   Review of Systems:  No headache, visual changes, nausea, vomiting, diarrhea, constipation, dizziness, abdominal pain, skin rash, fevers, chills, night sweats, weight loss, swollen lymph nodes, body aches, joint swelling, chest pain, shortness of breath, mood changes. POSITIVE muscle aches  Objective  Blood pressure 116/70, pulse 73, height 6' 7.5" (2.019 m), weight 239 lb (108.4 kg), SpO2 96%.   General: No apparent distress alert and oriented x3 mood and affect normal, dressed appropriately.  HEENT: Pupils equal, extraocular movements intact  Respiratory: Patient's speak in full sentences and does not appear short of  breath  Cardiovascular: No lower extremity edema, non tender, no erythema  Right hip exam does have severe tenderness to palpation of the greater trochanteric area.  Patient does have a positive FABER test noted.  Negative straight leg test noted.  Foot exam shows breakdown of the transverse and longitudinal arch.  Patient does have hallux limitus of the left big toe noted.  Mild overpronation of the hindfoot bilaterally with standing \   Procedure: Real-time Ultrasound Guided Injection of right greater trochanteric bursitis secondary to patient's body habitus Device: GE Logiq Q7 Ultrasound guided injection is preferred based studies that show increased duration, increased effect, greater accuracy, decreased procedural pain, increased response rate, and decreased cost with ultrasound guided versus blind injection.  Verbal informed consent obtained.  Time-out conducted.  Noted no overlying erythema, induration, or other signs of local infection.  Skin prepped in a sterile fashion.  Local anesthesia: Topical Ethyl chloride.  With sterile technique and under real time ultrasound guidance:  Greater trochanteric area was visualized and patient's bursa was noted. A 22-gauge 3 inch needle was inserted and 2 cc of 0.5% Marcaine and 1 cc of Kenalog 40 mg/dL was  injected. Pictures taken Completed without difficulty  Pain immediately resolved suggesting accurate placement of the medication.  Advised to call if fevers/chills, erythema, induration, drainage, or persistent bleeding.  Images permanently stored and available for review in the ultrasound unit.  Impression: Technically successful ultrasound guided injection.    Impression and Recommendations:     The above documentation has been reviewed and is accurate and complete Judi Saa, DO

## 2023-02-22 ENCOUNTER — Ambulatory Visit: Payer: Medicare Other | Admitting: Family Medicine

## 2023-02-22 ENCOUNTER — Encounter: Payer: Self-pay | Admitting: Family Medicine

## 2023-02-22 VITALS — BP 116/70 | HR 73 | Ht 79.5 in | Wt 239.0 lb

## 2023-02-22 DIAGNOSIS — M7061 Trochanteric bursitis, right hip: Secondary | ICD-10-CM | POA: Insufficient documentation

## 2023-02-22 DIAGNOSIS — M19072 Primary osteoarthritis, left ankle and foot: Secondary | ICD-10-CM | POA: Diagnosis not present

## 2023-02-22 DIAGNOSIS — M216X9 Other acquired deformities of unspecified foot: Secondary | ICD-10-CM | POA: Diagnosis not present

## 2023-02-22 NOTE — Assessment & Plan Note (Signed)
Chronic problem with worsening symptoms but this seems to be acutely worse today.  Discussed with patient that icing regimen of home exercises, which activities to determine which ones to avoid.  Increase activity slowly otherwise.  Patient work on hip abductor strengthening and work with Event organiser.  Differential includes lumbar radiculopathy but seems to be unlikely.  Follow-up again in 6 to 8 weeks

## 2023-02-22 NOTE — Patient Instructions (Signed)
Injected GT today Exercises See me again in 6-8 weeks

## 2023-02-22 NOTE — Assessment & Plan Note (Signed)
Has arthritic changes of the feet noted previously.  I do believe the patient does have a varicose vein also noted on the plantar aspect of the foot that could be contributing to some more of the discomfort.  Discussed with patient that icing regimen and home exercises, discussed which activities to do and which ones to avoid, increase activity slowly over the course of next several weeks.  Will send patient for custom orthotics with patient already failing the over-the-counter and proper shoes and recovery sandals.  Does have some mild overpronation of the hindfoot noted as well.  Follow-up again in 6 to 8 weeks

## 2023-02-23 ENCOUNTER — Ambulatory Visit: Payer: Medicare Other | Admitting: Family Medicine

## 2023-02-23 VITALS — BP 122/78 | Ht 78.0 in | Wt 240.0 lb

## 2023-02-23 DIAGNOSIS — M2142 Flat foot [pes planus] (acquired), left foot: Secondary | ICD-10-CM | POA: Diagnosis not present

## 2023-02-23 DIAGNOSIS — M2141 Flat foot [pes planus] (acquired), right foot: Secondary | ICD-10-CM

## 2023-02-23 NOTE — Progress Notes (Signed)
   PCP: Karie Georges, MD  SUBJECTIVE:   HPI:  Patient is a 67 y.o. male here at request of Dr Terrilee Files for custom orthotics. He has had long standing left foot pain along his longitudinal arch. He has been treating this with shoe wear modification, now only wearing shoes with good cushioning and arch support (Oofos and Hokas). He has continued to have pain and would like orthotics for improved arch support.  ROS:     See HPI  PERTINENT  PMH / PSH FH / / SH:  Past Medical, Surgical, Social, and Family History Reviewed & Updated in the EMR.  Pertinent findings include:  Non-contributory  Allergies  Allergen Reactions   Bee Venom Anaphylaxis   Iodine Solution [Povidone Iodine] Itching    Patient states his arm was irritated following a previous arm surgery where he had been scrubbed with betadine.   OBJECTIVE:  BP 122/78   Ht 6\' 6"  (1.981 m)   Wt 240 lb (108.9 kg)   BMI 27.73 kg/m   PHYSICAL EXAM:  GEN: Alert and Oriented, NAD, comfortable in exam room RESP: Unlabored respirations, symmetric chest rise PSY: normal mood, congruent affect   Bilateral Foot MSK EXAM: No gross deformity, bruising, or swelling. He has moderate loss of both long and transverse arches of bilateral feet. Mild hallus valgus of left great toe. Mild hyperpronation of bilateral feet at rest. His ankle and foot/toe ROM is full. No reproducible pain of his calcaneus or long arches to palpation bilaterally.  Assessment & Plan Pes planus of both feet Patient with suspected foot pain secondary to pes planus. Pain has persisted despite shoe wear modification. We made custom orthotics for improved arch support and cushioning today as described below. Patient will follow-up with [place] for modifications if he would like, however will follow-up with Dr. Katrinka Blazing for continued management of his presenting concern.  Patient was fitted for a: standard, cushioned, semi-rigid orthotic. The orthotic was heated and  afterward the patient stood on the orthotic blank positioned on the orthotic stand. The patient was positioned in subtalar neutral position and 10 degrees of ankle dorsiflexion in a weight bearing stance on the heated orthotic blank. After completion of molding, a stable base was applied to the orthotic blank. The blank was ground to a stable position for weight bearing. Size: 14 Base: Fastech with Blue med density EVA padding Posting: None, however may benefit from first ray post in the future if having persistence of left MTP pain. Additional orthotic padding: None   Jeffrey Salen, MD PGY-4, Sports Medicine Fellow [facility]  Addendum:  Patient seen in the office with fellow.  History, exam, plan of care were precepted with me.  Darene Lamer, DO, CAQSM

## 2023-03-15 ENCOUNTER — Ambulatory Visit: Payer: Medicare Other | Admitting: Family Medicine

## 2023-03-15 ENCOUNTER — Encounter: Payer: Self-pay | Admitting: Family Medicine

## 2023-03-15 VITALS — BP 104/82 | HR 59 | Temp 98.4°F | Ht 78.0 in | Wt 238.9 lb

## 2023-03-15 DIAGNOSIS — K5909 Other constipation: Secondary | ICD-10-CM

## 2023-03-15 DIAGNOSIS — R1013 Epigastric pain: Secondary | ICD-10-CM | POA: Diagnosis not present

## 2023-03-15 LAB — CBC WITH DIFFERENTIAL/PLATELET
Basophils Absolute: 0.1 10*3/uL (ref 0.0–0.1)
Basophils Relative: 1.3 % (ref 0.0–3.0)
Eosinophils Absolute: 0.3 10*3/uL (ref 0.0–0.7)
Eosinophils Relative: 7.6 % — ABNORMAL HIGH (ref 0.0–5.0)
HCT: 43.8 % (ref 39.0–52.0)
Hemoglobin: 15 g/dL (ref 13.0–17.0)
Lymphocytes Relative: 23.3 % (ref 12.0–46.0)
Lymphs Abs: 1 10*3/uL (ref 0.7–4.0)
MCHC: 34.2 g/dL (ref 30.0–36.0)
MCV: 91.2 fL (ref 78.0–100.0)
Monocytes Absolute: 0.4 10*3/uL (ref 0.1–1.0)
Monocytes Relative: 9.4 % (ref 3.0–12.0)
Neutro Abs: 2.6 10*3/uL (ref 1.4–7.7)
Neutrophils Relative %: 58.4 % (ref 43.0–77.0)
Platelets: 171 10*3/uL (ref 150.0–400.0)
RBC: 4.81 Mil/uL (ref 4.22–5.81)
RDW: 12.6 % (ref 11.5–15.5)
WBC: 4.5 10*3/uL (ref 4.0–10.5)

## 2023-03-15 LAB — COMPREHENSIVE METABOLIC PANEL
ALT: 26 U/L (ref 0–53)
AST: 23 U/L (ref 0–37)
Albumin: 4.4 g/dL (ref 3.5–5.2)
Alkaline Phosphatase: 49 U/L (ref 39–117)
BUN: 12 mg/dL (ref 6–23)
CO2: 30 meq/L (ref 19–32)
Calcium: 9.5 mg/dL (ref 8.4–10.5)
Chloride: 99 meq/L (ref 96–112)
Creatinine, Ser: 1.01 mg/dL (ref 0.40–1.50)
GFR: 75.78 mL/min (ref >60.00)
Glucose, Bld: 86 mg/dL (ref 70–99)
Potassium: 4.1 meq/L (ref 3.5–5.1)
Sodium: 134 meq/L — ABNORMAL LOW (ref 135–145)
Total Bilirubin: 0.9 mg/dL (ref 0.2–1.2)
Total Protein: 6.6 g/dL (ref 6.0–8.3)

## 2023-03-15 LAB — LIPASE: Lipase: 28 U/L (ref 11.0–59.0)

## 2023-03-15 LAB — TSH: TSH: 1.78 u[IU]/mL (ref 0.35–5.50)

## 2023-03-15 MED ORDER — PANTOPRAZOLE SODIUM 20 MG PO TBEC: 20.0000 mg | DELAYED_RELEASE_TABLET | Freq: Every day | ORAL | 3 refills | Status: DC

## 2023-03-15 MED ORDER — SENNOSIDES-DOCUSATE SODIUM 8.6-50 MG PO TABS: 1.0000 | ORAL_TABLET | Freq: Every day | ORAL | 0 refills | Status: DC

## 2023-03-15 NOTE — Patient Instructions (Signed)
A prescription for pantoprazole (Protonix) 20 mg was sent to your pharmacy.  This is a medication that helps reduce the acid production in your stomach.  You can take this daily to help with your abdominal pain.  A prescription for Colace-senna was also sent to the pharmacy.  This is a medication to help with chronic constipation.  You can take 1 tab daily.    The other option that was discussed to help with constipation with MiraLAX.  It can be found over-the-counter and is also available in generic.  It is a powder that you mix into liquid and drink daily if needed.  We will obtain labs to evaluate other possible causes of your abdominal pain.  For continued or worsening symptoms we can place a referral to GI (the stomach doctors) for you

## 2023-03-15 NOTE — Progress Notes (Signed)
Established Patient Office Visit   Subjective  Patient ID: Jeffrey [name], male    DOB: 04/03/1956  Age: 67 y.o. MRN: [id number]  Chief Complaint  Patient presents with   Heartburn    Patient came in today for stomach pain, (heartburn), started 3 weeks ago, dull - burning,     Patient is a 67 year old male followed by Dr. Casimiro Needle and seen for ongoing arm.  Patient endorses an intermittent, dull, epigastric pain that turns into a burning sensation.  Symptoms x 3 weeks.  Patient does not note association in symptoms with food.  Can occur at any time such as before eating or after.  Tums helps some.  Does not note anything that makes symptoms worse.  Patient denies nausea or vomiting with symptoms.  May have some abdominal bloating.  Has also noticed unconsciously tightening abdominal muscles while standing though denies pain at that time.  Patient does not eat spicy foods.  No symptoms when drinking coffee.  Denies changes in weight.  Patient using ibuprofen 600 mg and Voltaren gel occasionally for hip pain.  Last colonoscopy 2014, with 10-year recall.  Patient still has his gallbladder.  Does not have his appendix.  Pt mentions chronic constipation his entire life.  Typically has BM every 3 days.   Heartburn He complains of heartburn.    Patient Active Problem List   Diagnosis Date Noted   Greater trochanteric bursitis of right hip 02/22/2023   Intention tremor 05/02/2022   Arthritis of right elbow 01/06/2022   Arthritis of first metatarsophalangeal (MTP) joint of left foot 09/21/2021   Lateral epicondylitis of left elbow 09/21/2021   Chronic knee pain 07/23/2015   Hx of bee sting allergy 07/23/2015   Past Medical History:  Diagnosis Date   Abnormal Doppler ultrasound of carotid artery 07-06-2012   normal carotid doppler   Acute appendicitis 03/30/2017   AF (atrial fibrillation) (HCC)    Appendicitis, acute 03/30/2017   Chronic knee pain 07/23/2015   H/O echocardiogram 06-26-2012    Transthoracic echo    EF 55-60%   H/O prior ablation treatment 2011   History of atrial flutter    History of cardiac monitoring 06-15-2012   sinus    Hx of bee sting allergy 07/23/2015   Normal cardiac stress test 05-24-2007   low risk scan   Past Surgical History:  Procedure Laterality Date   ATRIAL FIBRILLATION ABLATION     LAPAROSCOPIC APPENDECTOMY N/A 03/30/2017   Procedure: APPENDECTOMY LAPAROSCOPIC;  Surgeon: Darnell Level, MD;  Location: [facility];  Service: General;  Laterality: N/A;   left testicle removed (no cancer, benign mass)     othroscopic knee surgery bil     vericose veins stripped both legs     Social History   Tobacco Use   Smoking status: Former    Current packs/day: 0.00    Average packs/day: 1.5 packs/day for 8.0 years (12.0 ttl pk-yrs)    Types: Cigarettes    Start date: 07/11/1969    Quit date: 07/11/1977    Years since quitting: 45.7   Smokeless tobacco: Never  Substance Use Topics   Alcohol use: Yes    Alcohol/week: 21.0 standard drinks of alcohol    Types: 21 Standard drinks or equivalent per week   Drug use: No   Family History  Problem Relation Age of Onset   Alcohol abuse Mother    Healthy Mother    Alcohol abuse Father    COPD Father  Theatre stage manager, smoker   Arthritis Maternal Grandmother    Cancer Maternal Grandmother        ?abdominal   Breast cancer Sister    Alzheimer's disease Maternal Grandfather    Heart attack Paternal Grandfather    Allergies  Allergen Reactions   Bee Venom Anaphylaxis   Iodine Solution [Povidone Iodine] Itching    Patient states his arm was irritated following a previous arm surgery where he had been scrubbed with betadine.      Review of Systems  Gastrointestinal:  Positive for heartburn.   Negative unless stated above    Objective:     BP 104/82 (BP Location: Left Arm, Patient Position: Sitting, Cuff Size: Large)   Pulse (!) 59   Temp 98.4 F (36.9 C) (Oral)   Ht 6\' 6"  (1.981 m)   Wt 238  lb 14.4 oz (108.4 kg)   SpO2 97%   BMI 27.61 kg/m  BP Readings from Last 3 Encounters:  03/15/23 104/82  02/23/23 122/78  02/22/23 116/70   Wt Readings from Last 3 Encounters:  03/15/23 238 lb 14.4 oz (108.4 kg)  02/23/23 240 lb (108.9 kg)  02/22/23 239 lb (108.4 kg)      Physical Exam Constitutional:      Appearance: Normal appearance.  HENT:     Head: Normocephalic and atraumatic.     Nose: Nose normal.     Mouth/Throat:     Mouth: Mucous membranes are moist.  Eyes:     Extraocular Movements: Extraocular movements intact.     Conjunctiva/sclera: Conjunctivae normal.     Pupils: Pupils are equal, round, and reactive to light.  Cardiovascular:     Rate and Rhythm: Normal rate.  Pulmonary:     Effort: Pulmonary effort is normal.  Abdominal:     General: Abdomen is flat. Bowel sounds are normal. There is no distension.     Palpations: Abdomen is soft.     Tenderness: There is abdominal tenderness in the right upper quadrant, epigastric area and left upper quadrant. There is no guarding or rebound.  Musculoskeletal:        General: Normal range of motion.  Skin:    General: Skin is warm and dry.  Neurological:     Mental Status: He is alert and oriented to person, place, and time.      No results found for any visits on 03/15/23.    Assessment & Plan:  Epigastric abdominal pain -     Comprehensive metabolic panel -     CBC with Differential/Platelet -     Lipase -     Pantoprazole Sodium; Take 1 tablet (20 mg total) by mouth daily.  Dispense: 30 tablet; Refill: 3  Chronic constipation -     Sennosides-Docusate Sodium; Take 1 tablet by mouth daily.  Dispense: 30 tablet; Refill: 0 -     TSH  Patient with intermittent epigastric abdominal pain x 3 weeks.  Concern for peptic ulcer.  Also consider gallbladder etiology or gastric ulcer.  Obtain labs.  Start PPI.  Food diary.  Advised for continued or worsening symptoms referral to GI for EGD.  Given strict  precautions  History of chronic constipation.  Discussed the importance of hydration and diet changes.  Can use OTC MiraLAX daily to twice daily as needed or Colace-senna.  Rx sent to pharmacy.  Given precautions.  Return if symptoms worsen or fail to improve.   Deeann Saint, MD

## 2023-03-27 ENCOUNTER — Ambulatory Visit: Payer: Medicare Other | Admitting: Family Medicine

## 2023-03-28 ENCOUNTER — Ambulatory Visit: Payer: Medicare Other | Admitting: Family Medicine

## 2023-03-28 ENCOUNTER — Other Ambulatory Visit: Payer: Self-pay

## 2023-03-28 VITALS — BP 124/84 | HR 64 | Ht 78.0 in | Wt 241.0 lb

## 2023-03-28 DIAGNOSIS — M25551 Pain in right hip: Secondary | ICD-10-CM | POA: Diagnosis not present

## 2023-03-28 DIAGNOSIS — M7061 Trochanteric bursitis, right hip: Secondary | ICD-10-CM

## 2023-03-28 NOTE — Assessment & Plan Note (Addendum)
Patient still having some pain.  Seems to be more of a glue tendinitis but I am concerned that there is some lumbar radiculopathy that could be potentially contributing.  Increase activity slowly otherwise.  Patient feels fairly confident though that patient will continue to improve.  Will need to follow-up with me as needed

## 2023-03-28 NOTE — Progress Notes (Signed)
[name] [address] [place] [address] Phone: [phone] Subjective:   Jeffrey [name], am serving as a scribe for Dr. Antoine Primas.  I'm seeing this patient by the request  of:  Karie Georges, MD  CC: Right hip and left foot pain follow-up  HPI: 02/22/2023 Has arthritic changes of the feet noted previously.  I do believe the patient does have a varicose vein also noted on the plantar aspect of the foot that could be contributing to some more of the discomfort.  Discussed with patient that icing regimen and home exercises, discussed which activities to do and which ones to avoid, increase activity slowly over the course of next several weeks.  Will send patient for custom orthotics with patient already failing the over-the-counter and proper shoes and recovery sandals.  Does have some mild overpronation of the hindfoot noted as well.  Follow-up again in 6 to 8 weeks     Chronic problem with worsening symptoms but this seems to be acutely worse today.  Discussed with patient that icing regimen of home exercises, which activities to determine which ones to avoid.  Increase activity slowly otherwise.  Patient work on hip abductor strengthening and work with Event organiser.  Differential includes lumbar radiculopathy but seems to be unlikely.  Follow-up again in 6 to 8 weeks     Update 03/28/2023 Jeffrey [name] is a 67 y.o. male coming in with complaint of R hip and L foot pain. Patient states recheck of hip and foot. Custom orthotics are doing well.  No significant difficulty otherwise.     Past Medical History:  Diagnosis Date   Abnormal Doppler ultrasound of carotid artery 07-06-2012   normal carotid doppler   Acute appendicitis 03/30/2017   AF (atrial fibrillation) (HCC)    Appendicitis, acute 03/30/2017   Chronic knee pain 07/23/2015   H/O echocardiogram 06-26-2012   Transthoracic echo    EF 55-60%   H/O prior ablation treatment 2011    History of atrial flutter    History of cardiac monitoring 06-15-2012   sinus    Hx of bee sting allergy 07/23/2015   Normal cardiac stress test 05-24-2007   low risk scan   Past Surgical History:  Procedure Laterality Date   ATRIAL FIBRILLATION ABLATION     LAPAROSCOPIC APPENDECTOMY N/A 03/30/2017   Procedure: APPENDECTOMY LAPAROSCOPIC;  Surgeon: Darnell Level, MD;  Location: [facility];  Service: General;  Laterality: N/A;   left testicle removed (no cancer, benign mass)     othroscopic knee surgery bil     vericose veins stripped both legs     Social History   Socioeconomic History   Marital status: Married    Spouse name: Not on file   Number of children: Not on file   Years of education: Not on file   Highest education level: Associate degree: occupational, Scientist, product/process development, or vocational program  Occupational History   Occupation: Production designer, theatre/television/film  Tobacco Use   Smoking status: Former    Current packs/day: 0.00    Average packs/day: 1.5 packs/day for 8.0 years (12.0 ttl pk-yrs)    Types: Cigarettes    Start date: 07/11/1969    Quit date: 07/11/1977    Years since quitting: 45.7   Smokeless tobacco: Never  Substance and Sexual Activity   Alcohol use: Yes    Alcohol/week: 21.0 standard drinks of alcohol    Types: 21 Standard drinks or equivalent per week   Drug use: No  Sexual activity: Not on file  Other Topics Concern   Not on file  Social History Narrative   Work or School: [employer], 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      Home Situation: lives with wife      Spiritual Beliefs: none      Lifestyle: gym 3 days per week; diet is healthy      Social Determinants of Health   Financial Resource Strain: Low Risk  (03/14/2023)   Overall Financial Resource Strain (CARDIA)    Difficulty of Paying Living Expenses: Not hard at all  Food Insecurity: No Food Insecurity (03/14/2023)   Hunger Vital Sign    Worried About Running Out of Food in the Last Year: Never true    Ran Out of Food in the Last Year:  Never true  Transportation Needs: No Transportation Needs (03/14/2023)   PRAPARE - Administrator, Civil Service (Medical): No    Lack of Transportation (Non-Medical): No  Physical Activity: Insufficiently Active (03/14/2023)   Exercise Vital Sign    Days of Exercise per Week: 2 days    Minutes of Exercise per Session: 40 min  Stress: No Stress Concern Present (03/14/2023)   [employer] of Occupational Health - Occupational Stress Questionnaire    Feeling of Stress : Not at all  Social Connections: Socially Isolated (03/14/2023)   Social Connection and Isolation Panel [NHANES]    Frequency of Communication with Friends and Family: Twice a week    Frequency of Social Gatherings with Friends and Family: Never    Attends Religious Services: Never    Database administrator or Organizations: No    Attends Engineer, structural: Not on file    Marital Status: Married   Allergies  Allergen Reactions   Bee Venom Anaphylaxis   Iodine Solution [Povidone Iodine] Itching    Patient states his arm was irritated following a previous arm surgery where he had been scrubbed with betadine.   Family History  Problem Relation Age of Onset   Alcohol abuse Mother    Healthy Mother    Alcohol abuse Father    COPD Father        Theatre stage manager, smoker   Arthritis Maternal Grandmother    Cancer Maternal Grandmother        ?abdominal   Breast cancer Sister    Alzheimer's disease Maternal Grandfather    Heart attack Paternal Grandfather      Current Outpatient Medications (Cardiovascular):    EPINEPHrine 0.3 mg/0.3 mL IJ SOAJ injection, Inject 0.3 mLs (0.3 mg total) into the muscle as needed for anaphylaxis.   propranolol (INDERAL) 20 MG tablet, TAKE 1 TABLET BY MOUTH TWICE  DAILY  Current Outpatient Medications (Respiratory):    levocetirizine (XYZAL) 5 MG tablet, Take 5 mg by mouth every evening.    Current Outpatient Medications (Other):    Cholecalciferol (VITAMIN  D) 50 MCG (2000 UT) CAPS, Take by mouth.   citalopram (CELEXA) 20 MG tablet, TAKE 1 TABLET BY MOUTH DAILY   fish oil-omega-3 fatty acids 1000 MG capsule, Take 1 g by mouth daily.   Glucosamine-Chondroitin (GLUCOSAMINE CHONDR COMPLEX PO), Take 1 tablet by mouth 2 (two) times daily.   Multiple Vitamin (MULITIVITAMIN WITH MINERALS) TABS, Take 1 tablet by mouth daily.   pantoprazole (PROTONIX) 20 MG tablet, Take 1 tablet (20 mg total) by mouth daily.   senna-docusate (SENOKOT-S) 8.6-50 MG tablet, Take 1 tablet by mouth daily.   Tart Cherry 1200 MG CAPS, Take by  mouth.   Turmeric 500 MG TABS, Take by mouth.   Reviewed prior external information including notes and imaging from  primary care provider As well as notes that were available from care everywhere and other healthcare systems.  Past medical history, social, surgical and family history all reviewed in electronic medical record.  No pertanent information unless stated regarding to the chief complaint.   Review of Systems:  No headache, visual changes, nausea, vomiting, diarrhea, constipation, dizziness, abdominal pain, skin rash, fevers, chills, night sweats, weight loss, swollen lymph nodes, body aches, joint swelling, chest pain, shortness of breath, mood changes. POSITIVE muscle aches  Objective  Blood pressure 124/84, pulse 64, height 6\' 6"  (1.981 m), weight 241 lb (109.3 kg), SpO2 97%.   General: No apparent distress alert and oriented x3 mood and affect normal, dressed appropriately.  HEENT: Pupils equal, extraocular movements intact  Respiratory: Patient's speak in full sentences and does not appear short of breath  Cardiovascular: No lower extremity edema, non tender, no erythema  Right.  Has more pain over the gluteal area than anything else at the moment.  Positive FABER test on the right side.  Patient does have crossover pain as well.  Tightness in the paraspinal musculature otherwise in the lower back.  Foot exam fairly  unremarkable but patient did not remove shoes.    Impression and Recommendations:     The above documentation has been reviewed and is accurate and complete Judi Saa, DO

## 2023-04-07 ENCOUNTER — Ambulatory Visit: Payer: Medicare Other | Admitting: Family Medicine

## 2023-04-25 ENCOUNTER — Other Ambulatory Visit: Payer: Self-pay | Admitting: Family Medicine

## 2023-04-25 DIAGNOSIS — F411 Generalized anxiety disorder: Secondary | ICD-10-CM

## 2023-05-08 ENCOUNTER — Ambulatory Visit (INDEPENDENT_AMBULATORY_CARE_PROVIDER_SITE_OTHER): Payer: Medicare Other | Admitting: Family Medicine

## 2023-05-08 ENCOUNTER — Encounter: Payer: Self-pay | Admitting: Family Medicine

## 2023-05-08 VITALS — BP 120/70 | HR 60 | Temp 97.8°F | Ht 79.5 in | Wt 243.6 lb

## 2023-05-08 DIAGNOSIS — Z125 Encounter for screening for malignant neoplasm of prostate: Secondary | ICD-10-CM | POA: Diagnosis not present

## 2023-05-08 DIAGNOSIS — Z1211 Encounter for screening for malignant neoplasm of colon: Secondary | ICD-10-CM | POA: Diagnosis not present

## 2023-05-08 DIAGNOSIS — G252 Other specified forms of tremor: Secondary | ICD-10-CM

## 2023-05-08 DIAGNOSIS — Z13220 Encounter for screening for lipoid disorders: Secondary | ICD-10-CM | POA: Diagnosis not present

## 2023-05-08 DIAGNOSIS — Z0000 Encounter for general adult medical examination without abnormal findings: Secondary | ICD-10-CM | POA: Diagnosis not present

## 2023-05-08 LAB — COMPREHENSIVE METABOLIC PANEL
ALT: 25 U/L (ref 0–53)
AST: 24 U/L (ref 0–37)
Albumin: 4.3 g/dL (ref 3.5–5.2)
Alkaline Phosphatase: 58 U/L (ref 39–117)
BUN: 8 mg/dL (ref 6–23)
CO2: 31 meq/L (ref 19–32)
Calcium: 9 mg/dL (ref 8.4–10.5)
Chloride: 100 meq/L (ref 96–112)
Creatinine, Ser: 0.91 mg/dL (ref 0.40–1.50)
GFR: 85.79 mL/min (ref >60.00)
Glucose, Bld: 84 mg/dL (ref 70–99)
Potassium: 4.4 meq/L (ref 3.5–5.1)
Sodium: 136 meq/L (ref 135–145)
Total Bilirubin: 0.7 mg/dL (ref 0.2–1.2)
Total Protein: 6.2 g/dL (ref 6.0–8.3)

## 2023-05-08 LAB — PSA, MEDICARE: PSA: 1.02 ng/mL (ref 0.10–4.00)

## 2023-05-08 LAB — LIPID PANEL
Cholesterol: 178 mg/dL (ref 0–200)
HDL: 52.1 mg/dL (ref >39.00)
LDL Cholesterol: 104 mg/dL — ABNORMAL HIGH (ref 0–99)
NonHDL: 126.06
Total CHOL/HDL Ratio: 3
Triglycerides: 108 mg/dL (ref 0.0–149.0)
VLDL: 21.6 mg/dL (ref 0.0–40.0)

## 2023-05-08 NOTE — Progress Notes (Signed)
 Complete physical exam  Patient: Jeffrey [name]   DOB: 07-28-1955   67 y.o. Male  MRN: [id number]  Subjective:    Chief Complaint  Patient presents with   Annual Exam    Jeffrey [name] is a 68 y.o. male who presents today for a complete physical exam. He reports consuming a general diet. Gym/ health club routine includes goes to [employer] twice a week, a lot of walking with volunteering at the hospital. He generally feels well. He reports sleeping well. He does not have additional problems to discuss today.    Most recent fall risk assessment:    05/08/2023    9:13 AM  Fall Risk   Falls in the past year? 0  Number falls in past yr: 1  Injury with Fall? 0  Risk for fall due to : No Fall Risks  Follow up Falls evaluation completed     Most recent depression screenings:    05/08/2023    9:17 AM 01/10/2023    9:30 AM  PHQ 2/9 Scores  PHQ - 2 Score 0 0  PHQ- 9 Score  0    Vision:Within last year and Dental: No current dental problems and Receives regular dental care  Patient Active Problem List   Diagnosis Date Noted   Greater trochanteric bursitis of right hip 02/22/2023   Intention tremor 05/02/2022   Arthritis of right elbow 01/06/2022   Arthritis of first metatarsophalangeal (MTP) joint of left foot 09/21/2021   Lateral epicondylitis of left elbow 09/21/2021   Chronic knee pain 07/23/2015   Hx of bee sting allergy 07/23/2015      Patient Care Team: Ozell Heron HERO, MD as PCP - General (Family Medicine)   Outpatient Medications Prior to Visit  Medication Sig   Cholecalciferol (VITAMIN D) 50 MCG (2000 UT) CAPS Take by mouth.   citalopram  (CELEXA ) 20 MG tablet TAKE 1 TABLET BY MOUTH DAILY   EPINEPHrine  0.3 mg/0.3 mL IJ SOAJ injection Inject 0.3 mLs (0.3 mg total) into the muscle as needed for anaphylaxis.   fish oil-omega-3 fatty acids 1000 MG capsule Take 1 g by mouth daily.   Glucosamine-Chondroitin (GLUCOSAMINE CHONDR COMPLEX PO) Take 1 tablet by mouth 2  (two) times daily.   levocetirizine (XYZAL) 5 MG tablet Take 5 mg by mouth every evening.   Multiple Vitamin (MULITIVITAMIN WITH MINERALS) TABS Take 1 tablet by mouth daily.   propranolol  (INDERAL ) 20 MG tablet TAKE 1 TABLET BY MOUTH TWICE  DAILY   Tart Cherry 1200 MG CAPS Take by mouth.   Turmeric 500 MG TABS Take by mouth.   [DISCONTINUED] pantoprazole  (PROTONIX ) 20 MG tablet Take 1 tablet (20 mg total) by mouth daily.   [DISCONTINUED] senna-docusate (SENOKOT-S) 8.6-50 MG tablet Take 1 tablet by mouth daily.   No facility-administered medications prior to visit.    Review of Systems  HENT:  Negative for hearing loss.   Eyes:  Negative for blurred vision.  Respiratory:  Negative for shortness of breath.   Cardiovascular:  Negative for chest pain.  Gastrointestinal: Negative.   Genitourinary: Negative.   Musculoskeletal:  Negative for back pain.  Neurological:  Negative for headaches.  Psychiatric/Behavioral:  Negative for depression.        Objective:     BP 120/70   Pulse 60   Temp 97.8 F (36.6 C) (Oral)   Ht 6' 7.5 (2.019 m)   Wt 243 lb 9.6 oz (110.5 kg)   SpO2 98%   BMI 27.10 kg/m  Physical Exam Vitals reviewed.  Constitutional:      Appearance: Normal appearance. He is well-groomed and normal weight.  HENT:     Right Ear: Tympanic membrane and ear canal normal.     Left Ear: Tympanic membrane and ear canal normal.     Mouth/Throat:     Mouth: Mucous membranes are moist.     Pharynx: No posterior oropharyngeal erythema.  Eyes:     Extraocular Movements: Extraocular movements intact.     Conjunctiva/sclera: Conjunctivae normal.  Neck:     Thyroid : No thyromegaly.  Cardiovascular:     Rate and Rhythm: Normal rate and regular rhythm.     Heart sounds: S1 normal and S2 normal. No murmur heard. Pulmonary:     Effort: Pulmonary effort is normal.     Breath sounds: Normal breath sounds and air entry. No rales.  Abdominal:     General: Abdomen is flat.  Bowel sounds are normal.  Musculoskeletal:     Right lower leg: No edema.     Left lower leg: No edema.  Lymphadenopathy:     Cervical: No cervical adenopathy.  Neurological:     General: No focal deficit present.     Mental Status: He is alert and oriented to person, place, and time.     Gait: Gait is intact.  Psychiatric:        Mood and Affect: Mood and affect normal.      No results found for any visits on 05/08/23.     Assessment & Plan:    Routine Health Maintenance and Physical Exam  Immunization History  Administered Date(s) Administered   Fluad  Quad(high Dose 65+) 01/24/2019   Influenza Split 02/16/2011, 01/20/2012   Influenza Whole 02/04/2010   Influenza, High Dose Seasonal PF 01/17/2020   Influenza,inj,Quad PF,6+ Mos 01/17/2014, 02/19/2015, 01/18/2016, 01/17/2017, 01/15/2018   Influenza-Unspecified 01/15/2021, 12/24/2021, 01/28/2022   PFIZER(Purple Top)SARS-COV-2 Vaccination 05/09/2019, 05/27/2019, 02/07/2020, 01/15/2021   PNEUMOCOCCAL CONJUGATE-20 04/28/2021   Pfizer Covid-19 Vaccine Bivalent Booster 62yrs & up 02/21/2022   Pfizer(Comirnaty )Fall Seasonal Vaccine 12 years and older 02/25/2022   Pneumococcal Polysaccharide-23 01/24/2019   Respiratory Syncytial Virus Vaccine ,Recomb Aduvanted(Arexvy ) 01/28/2022   Td 04/26/2007   Tdap 04/25/2017   Unspecified SARS-COV-2 Vaccination 01/16/2021   Zoster Recombinant(Shingrix ) 03/10/2021, 05/10/2021   Zoster, Live 08/04/2014    Health Maintenance  Topic Date Due   COVID-19 Vaccine (7 - 2024-25 season) 12/25/2022   Colonoscopy  09/03/2023   INFLUENZA VACCINE  07/24/2023 (Originally 11/24/2022)   Medicare Annual Wellness (AWV)  01/10/2024   DTaP/Tdap/Td (3 - Td or Tdap) 04/26/2027   Pneumonia Vaccine 65+ Years old  Completed   Hepatitis C Screening  Completed   Zoster Vaccines- Shingrix   Completed   HPV VACCINES  Aged Out    Discussed health benefits of physical activity, and encouraged him to engage in regular  exercise appropriate for his age and condition.  Intention tremor -     Comprehensive metabolic panel  Routine general medical examination at a health care facility  Prostate cancer screening -     PSA, Medicare  Lipid screening -     Lipid panel; Future  Colon cancer screening -     Ambulatory referral to Gastroenterology  Normal physical exam findings. Counseled patient on healthy bowel habits and handouts given on healthy exercise and eating. Labs ordered today for annual surveillance and risk factor assessment.   Return in 1 year (on 05/07/2024).     Heron CHRISTELLA Sharper, MD

## 2023-05-08 NOTE — Patient Instructions (Signed)
 Health Maintenance, Male  Adopting a healthy lifestyle and getting preventive care are important in promoting health and wellness. Ask your health care provider about:  The right schedule for you to have regular tests and exams.  Things you can do on your own to prevent diseases and keep yourself healthy.  What should I know about diet, weight, and exercise?  Eat a healthy diet    Eat a diet that includes plenty of vegetables, fruits, low-fat dairy products, and lean protein.  Do not eat a lot of foods that are high in solid fats, added sugars, or sodium.  Maintain a healthy weight  Body mass index (BMI) is a measurement that can be used to identify possible weight problems. It estimates body fat based on height and weight. Your health care provider can help determine your BMI and help you achieve or maintain a healthy weight.  Get regular exercise  Get regular exercise. This is one of the most important things you can do for your health. Most adults should:  Exercise for at least 150 minutes each week. The exercise should increase your heart rate and make you sweat (moderate-intensity exercise).  Do strengthening exercises at least twice a week. This is in addition to the moderate-intensity exercise.  Spend less time sitting. Even light physical activity can be beneficial.  Watch cholesterol and blood lipids  Have your blood tested for lipids and cholesterol at 70 years of age, then have this test every 5 years.  You may need to have your cholesterol levels checked more often if:  Your lipid or cholesterol levels are high.  You are older than 70 years of age.  You are at high risk for heart disease.  What should I know about cancer screening?  Many types of cancers can be detected early and may often be prevented. Depending on your health history and family history, you may need to have cancer screening at various ages. This may include screening for:  Colorectal cancer.  Prostate cancer.  Skin cancer.  Lung  cancer.  What should I know about heart disease, diabetes, and high blood pressure?  Blood pressure and heart disease  High blood pressure causes heart disease and increases the risk of stroke. This is more likely to develop in people who have high blood pressure readings or are overweight.  Talk with your health care provider about your target blood pressure readings.  Have your blood pressure checked:  Every 3-5 years if you are 24-52 years of age.  Every year if you are 3 years old or older.  If you are between the ages of 60 and 72 and are a current or former smoker, ask your health care provider if you should have a one-time screening for abdominal aortic aneurysm (AAA).  Diabetes  Have regular diabetes screenings. This checks your fasting blood sugar level. Have the screening done:  Once every three years after age 66 if you are at a normal weight and have a low risk for diabetes.  More often and at a younger age if you are overweight or have a high risk for diabetes.  What should I know about preventing infection?  Hepatitis B  If you have a higher risk for hepatitis B, you should be screened for this virus. Talk with your health care provider to find out if you are at risk for hepatitis B infection.  Hepatitis C  Blood testing is recommended for:  Everyone born from [birth year] through 1967.  Anyone  with known risk factors for hepatitis C.  Sexually transmitted infections (STIs)  You should be screened each year for STIs, including gonorrhea and chlamydia, if:  You are sexually active and are younger than 70 years of age.  You are older than 70 years of age and your health care provider tells you that you are at risk for this type of infection.  Your sexual activity has changed since you were last screened, and you are at increased risk for chlamydia or gonorrhea. Ask your health care provider if you are at risk.  Ask your health care provider about whether you are at high risk for HIV. Your health care provider  may recommend a prescription medicine to help prevent HIV infection. If you choose to take medicine to prevent HIV, you should first get tested for HIV. You should then be tested every 3 months for as long as you are taking the medicine.  Follow these instructions at home:  Alcohol use  Do not drink alcohol if your health care provider tells you not to drink.  If you drink alcohol:  Limit how much you have to 0-2 drinks a day.  Know how much alcohol is in your drink. In the [place] one drink equals one 12 oz bottle of beer (355 mL), one 5 oz glass of wine (148 mL), or one 1 oz glass of hard liquor (44 mL).  Lifestyle  Do not use any products that contain nicotine or tobacco. These products include cigarettes, chewing tobacco, and vaping devices, such as e-cigarettes. If you need help quitting, ask your health care provider.  Do not use street drugs.  Do not share needles.  Ask your health care provider for help if you need support or information about quitting drugs.  General instructions  Schedule regular health, dental, and eye exams.  Stay current with your vaccines.  Tell your health care provider if:  You often feel depressed.  You have ever been abused or do not feel safe at home.  Summary  Adopting a healthy lifestyle and getting preventive care are important in promoting health and wellness.  Follow your health care provider's instructions about healthy diet, exercising, and getting tested or screened for diseases.  Follow your health care provider's instructions on monitoring your cholesterol and blood pressure.  This information is not intended to replace advice given to you by your health care provider. Make sure you discuss any questions you have with your health care provider.  Document Revised: 08/31/2020 Document Reviewed: 08/31/2020  Elsevier Patient Education  2024 [employer].

## 2023-06-08 ENCOUNTER — Encounter: Payer: Self-pay | Admitting: Family Medicine

## 2023-06-08 DIAGNOSIS — E782 Mixed hyperlipidemia: Secondary | ICD-10-CM

## 2023-06-08 MED ORDER — ATORVASTATIN CALCIUM 20 MG PO TABS: 20.0000 mg | ORAL_TABLET | Freq: Every day | ORAL | 0 refills | Status: DC

## 2023-06-08 NOTE — Telephone Encounter (Signed)
Would you be able to check on the colonoscopy referral for the patient? Thanks!

## 2023-06-13 NOTE — Telephone Encounter (Signed)
 It is in their workqueue. They will be reaching out to patient to schedule soon. If patient have any questions, they could contact [facility] at [phone].

## 2023-06-16 DIAGNOSIS — H2513 Age-related nuclear cataract, bilateral: Secondary | ICD-10-CM | POA: Diagnosis not present

## 2023-06-19 ENCOUNTER — Encounter: Payer: Self-pay | Admitting: Internal Medicine

## 2023-06-22 NOTE — Progress Notes (Signed)
 [name] [address] [place] [address] Phone: [phone] Subjective:   Jeffrey [name], am serving as a scribe for Dr. Antoine Primas.  I'm seeing this patient by the request  of:  Karie Georges, MD  CC: First toe pain  [id number]  04/24/2023 Patient still having some pain.  Seems to be more of a glue tendinitis but I am concerned that there is some lumbar radiculopathy that could be potentially contributing.  Increase activity slowly otherwise.  Patient feels fairly confident though that patient will continue to improve.  Will need to follow-up with me as needed      Update 06/28/2023 AUSAR GEORGIOU is a 68 y.o. male coming in with complaint of R hip and 1st MTP jt pain. Custom orthotics made in October 2024. Patient states that his hip is doing better. His foot pain has not improved. Pain runs from top of great toe into the midfoot. Has some days where he will not have pain. If he is at hospital pushing wheel chairs he will have pain and then at night his pain seems to be worse. Notices swelling near the end of work days. Custom orthotics do seem to be helpful.     Past Medical History:  Diagnosis Date   Abnormal Doppler ultrasound of carotid artery 07-06-2012   normal carotid doppler   Acute appendicitis 03/30/2017   AF (atrial fibrillation) (HCC)    Appendicitis, acute 03/30/2017   Chronic knee pain 07/23/2015   H/O echocardiogram 06-26-2012   Transthoracic echo    EF 55-60%   H/O prior ablation treatment 2011   History of atrial flutter    History of cardiac monitoring 06-15-2012   sinus    Hx of bee sting allergy 07/23/2015   Normal cardiac stress test 05-24-2007   low risk scan   Past Surgical History:  Procedure Laterality Date   ATRIAL FIBRILLATION ABLATION     LAPAROSCOPIC APPENDECTOMY N/A 03/30/2017   Procedure: APPENDECTOMY LAPAROSCOPIC;  Surgeon: Darnell Level, MD;  Location: [facility];  Service: General;  Laterality: N/A;    left testicle removed (no cancer, benign mass)     othroscopic knee surgery bil     vericose veins stripped both legs     Social History   Socioeconomic History   Marital status: Married    Spouse name: Not on file   Number of children: Not on file   Years of education: Not on file   Highest education level: Associate degree: occupational, Scientist, product/process development, or vocational program  Occupational History   Occupation: Production designer, theatre/television/film  Tobacco Use   Smoking status: Former    Current packs/day: 0.00    Average packs/day: 1.5 packs/day for 8.0 years (12.0 ttl pk-yrs)    Types: Cigarettes    Start date: 07/11/1969    Quit date: 07/11/1977    Years since quitting: 45.9   Smokeless tobacco: Never  Substance and Sexual Activity   Alcohol use: Yes    Alcohol/week: 21.0 standard drinks of alcohol    Types: 21 Standard drinks or equivalent per week   Drug use: No   Sexual activity: Not on file  Other Topics Concern   Not on file  Social History Narrative   Work or School: [employer], 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      Home Situation: lives with wife      Spiritual Beliefs: none      Lifestyle: gym 3 days per week; diet is healthy  Social Drivers of Corporate investment banker Strain: Low Risk  (05/08/2023)   Overall Financial Resource Strain (CARDIA)    Difficulty of Paying Living Expenses: Not hard at all  Food Insecurity: No Food Insecurity (05/08/2023)   Hunger Vital Sign    Worried About Running Out of Food in the Last Year: Never true    Ran Out of Food in the Last Year: Never true  Transportation Needs: No Transportation Needs (05/08/2023)   PRAPARE - Administrator, Civil Service (Medical): No    Lack of Transportation (Non-Medical): No  Physical Activity: Insufficiently Active (05/08/2023)   Exercise Vital Sign    Days of Exercise per Week: 2 days    Minutes of Exercise per Session: 60 min  Stress: No Stress Concern Present (05/08/2023)   [employer] of Occupational Health -  Occupational Stress Questionnaire    Feeling of Stress : Not at all  Social Connections: Moderately Isolated (05/08/2023)   Social Connection and Isolation Panel [NHANES]    Frequency of Communication with Friends and Family: Once a week    Frequency of Social Gatherings with Friends and Family: Never    Attends Religious Services: Never    Database administrator or Organizations: Yes    Attends Engineer, structural: More than 4 times per year    Marital Status: Married   Allergies  Allergen Reactions   Bee Venom Anaphylaxis   Iodine Solution [Povidone Iodine] Itching    Patient states his arm was irritated following a previous arm surgery where he had been scrubbed with betadine.   Family History  Problem Relation Age of Onset   Alcohol abuse Mother    Healthy Mother    Alcohol abuse Father    COPD Father        Theatre stage manager, smoker   Arthritis Maternal Grandmother    Cancer Maternal Grandmother        ?abdominal   Breast cancer Sister    Alzheimer's disease Maternal Grandfather    Heart attack Paternal Grandfather      Current Outpatient Medications (Cardiovascular):    EPINEPHrine 0.3 mg/0.3 mL IJ SOAJ injection, Inject 0.3 mLs (0.3 mg total) into the muscle as needed for anaphylaxis.   propranolol (INDERAL) 20 MG tablet, TAKE 1 TABLET BY MOUTH TWICE  DAILY  Current Outpatient Medications (Respiratory):    levocetirizine (XYZAL) 5 MG tablet, Take 5 mg by mouth every evening.    Current Outpatient Medications (Other):    Cholecalciferol (VITAMIN D) 50 MCG (2000 UT) CAPS, Take by mouth.   fish oil-omega-3 fatty acids 1000 MG capsule, Take 1 g by mouth daily.   Glucosamine-Chondroitin (GLUCOSAMINE CHONDR COMPLEX PO), Take 1 tablet by mouth 2 (two) times daily.   Multiple Vitamin (MULITIVITAMIN WITH MINERALS) TABS, Take 1 tablet by mouth daily.   Tart Cherry 1200 MG CAPS, Take by mouth.   Turmeric 500 MG TABS, Take by mouth.   citalopram (CELEXA) 20 MG tablet,  TAKE 1 TABLET BY MOUTH DAILY   Reviewed prior external information including notes and imaging from  primary care provider As well as notes that were available from care everywhere and other healthcare systems.  Past medical history, social, surgical and family history all reviewed in electronic medical record.  No pertanent information unless stated regarding to the chief complaint.   Review of Systems:  No headache, visual changes, nausea, vomiting, diarrhea, constipation, dizziness, abdominal pain, skin rash, fevers, chills, night sweats, weight loss,  swollen lymph nodes, body aches, joint swelling, chest pain, shortness of breath, mood changes. POSITIVE muscle aches  Objective  Blood pressure 108/66, pulse 70, height 6\' 7"  (2.007 m), weight 242 lb (109.8 kg), SpO2 95%.   General: No apparent distress alert and oriented x3 mood and affect normal, dressed appropriately.  HEENT: Pupils equal, extraocular movements intact  Respiratory: Patient's speak in full sentences and does not appear short of breath  Cardiovascular: No lower extremity edema, non tender, no erythema  Left foot shows a patient does have limited range of motion of the MTP noted.  Swelling noted.  No erythema or warmness noted.   Procedure: Real-time Ultrasound Guided Injection of left MTP joint Device: GE Logiq Q7 Ultrasound guided injection is preferred based studies that show increased duration, increased effect, greater accuracy, decreased procedural pain, increased response rate, and decreased cost with ultrasound guided versus blind injection.  Verbal informed consent obtained.  Time-out conducted.  Noted no overlying erythema, induration, or other signs of local infection.  Skin prepped in a sterile fashion.  Local anesthesia: Topical Ethyl chloride.  With sterile technique and under real time ultrasound guidance: With a 25-gauge half inch needle injected with 0.5 cc of 0.5% Marcaine and 0.5 cc of Kenalog 40  mg/mL Completed without difficulty  Pain immediately resolved suggesting accurate placement of the medication.  Advised to call if fevers/chills, erythema, induration, drainage, or persistent bleeding.  Impression: Technically successful ultrasound guided injection.   Impression and Recommendations:     The above documentation has been reviewed and is accurate and complete Judi Saa, DO

## 2023-06-27 ENCOUNTER — Other Ambulatory Visit: Payer: Self-pay | Admitting: Family Medicine

## 2023-06-27 DIAGNOSIS — F411 Generalized anxiety disorder: Secondary | ICD-10-CM

## 2023-06-28 ENCOUNTER — Other Ambulatory Visit: Payer: Self-pay

## 2023-06-28 ENCOUNTER — Encounter: Payer: Self-pay | Admitting: Family Medicine

## 2023-06-28 ENCOUNTER — Ambulatory Visit: Payer: Medicare Other | Admitting: Family Medicine

## 2023-06-28 VITALS — BP 108/66 | HR 70 | Ht 79.0 in | Wt 242.0 lb

## 2023-06-28 DIAGNOSIS — M79672 Pain in left foot: Secondary | ICD-10-CM

## 2023-06-28 DIAGNOSIS — M19072 Primary osteoarthritis, left ankle and foot: Secondary | ICD-10-CM

## 2023-06-28 NOTE — Patient Instructions (Signed)
 Injected toe today

## 2023-06-28 NOTE — Assessment & Plan Note (Signed)
 Patient given injection and tolerated the procedure well, discussed icing regimen and home exercises, discussed which activities to do and which ones to avoid.  Increase activity slowly otherwise.  Patient will continue to wear the custom orthotics and all the other modalities that we have been doing.

## 2023-07-11 ENCOUNTER — Ambulatory Visit: Admitting: Family Medicine

## 2023-07-11 ENCOUNTER — Encounter: Payer: Self-pay | Admitting: Family Medicine

## 2023-07-11 ENCOUNTER — Other Ambulatory Visit: Payer: Self-pay

## 2023-07-11 VITALS — BP 112/72 | HR 65 | Ht 79.0 in

## 2023-07-11 DIAGNOSIS — M79672 Pain in left foot: Secondary | ICD-10-CM

## 2023-07-11 DIAGNOSIS — M19072 Primary osteoarthritis, left ankle and foot: Secondary | ICD-10-CM | POA: Diagnosis not present

## 2023-07-11 NOTE — Patient Instructions (Addendum)
 Injected foot in 2 spots If not better in 2 weeks, will get MRI See me in 2 months

## 2023-07-11 NOTE — Assessment & Plan Note (Signed)
 Patient given injection and tolerated the procedure well, no swelling of the first MTP noted today after previous injection.  Hopeful that this will make an improvement.  If no significant improvement then I do think we need to consider MRI otherwise.  Follow-up again in 2 months

## 2023-07-11 NOTE — Progress Notes (Signed)
 Jeffrey [name] [address] [place] [address] Phone: [phone] Subjective:    I'm seeing this patient by the request  of:  Karie Georges, MD  CC: Foot pain follow-up  [id number]  06/28/2023 Patient given injection and tolerated the procedure well, discussed icing regimen and home exercises, discussed which activities to do and which ones to avoid.  Increase activity slowly otherwise.  Patient will continue to wear the custom orthotics and all the other modalities that we have been doing.     Update 07/11/2023 Jeffrey [name] is a 68 y.o. male coming in with complaint of L foot pain. Patient states that injection only provided him with relief for one day. Worse as the day goes on. Walking on outside of foot.     Past Medical History:  Diagnosis Date   Abnormal Doppler ultrasound of carotid artery 07-06-2012   normal carotid doppler   Acute appendicitis 03/30/2017   AF (atrial fibrillation) (HCC)    Appendicitis, acute 03/30/2017   Chronic knee pain 07/23/2015   H/O echocardiogram 06-26-2012   Transthoracic echo    EF 55-60%   H/O prior ablation treatment 2011   History of atrial flutter    History of cardiac monitoring 06-15-2012   sinus    Hx of bee sting allergy 07/23/2015   Normal cardiac stress test 05-24-2007   low risk scan   Past Surgical History:  Procedure Laterality Date   ATRIAL FIBRILLATION ABLATION     LAPAROSCOPIC APPENDECTOMY N/A 03/30/2017   Procedure: APPENDECTOMY LAPAROSCOPIC;  Surgeon: Darnell Level, MD;  Location: [facility];  Service: General;  Laterality: N/A;   left testicle removed (no cancer, benign mass)     othroscopic knee surgery bil     vericose veins stripped both legs     Social History   Socioeconomic History   Marital status: Married    Spouse name: Not on file   Number of children: Not on file   Years of education: Not on file   Highest education level: Associate degree: occupational, Scientist, product/process development, or  vocational program  Occupational History   Occupation: Production designer, theatre/television/film  Tobacco Use   Smoking status: Former    Current packs/day: 0.00    Average packs/day: 1.5 packs/day for 8.0 years (12.0 ttl pk-yrs)    Types: Cigarettes    Start date: 07/11/1969    Quit date: 07/11/1977    Years since quitting: 46.0   Smokeless tobacco: Never  Substance and Sexual Activity   Alcohol use: Yes    Alcohol/week: 21.0 standard drinks of alcohol    Types: 21 Standard drinks or equivalent per week   Drug use: No   Sexual activity: Not on file  Other Topics Concern   Not on file  Social History Narrative   Work or School: [employer], 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      Home Situation: lives with wife      Spiritual Beliefs: none      Lifestyle: gym 3 days per week; diet is healthy      Social Drivers of Corporate investment banker Strain: Low Risk  (05/08/2023)   Overall Financial Resource Strain (CARDIA)    Difficulty of Paying Living Expenses: Not hard at all  Food Insecurity: No Food Insecurity (05/08/2023)   Hunger Vital Sign    Worried About Running Out of Food in the Last Year: Never true    Ran Out of Food in the Last Year: Never true  Transportation Needs:  No Transportation Needs (05/08/2023)   PRAPARE - Administrator, Civil Service (Medical): No    Lack of Transportation (Non-Medical): No  Physical Activity: Insufficiently Active (05/08/2023)   Exercise Vital Sign    Days of Exercise per Week: 2 days    Minutes of Exercise per Session: 60 min  Stress: No Stress Concern Present (05/08/2023)   [employer] of Occupational Health - Occupational Stress Questionnaire    Feeling of Stress : Not at all  Social Connections: Moderately Isolated (05/08/2023)   Social Connection and Isolation Panel [NHANES]    Frequency of Communication with Friends and Family: Once a week    Frequency of Social Gatherings with Friends and Family: Never    Attends Religious Services: Never    Database administrator or  Organizations: Yes    Attends Engineer, structural: More than 4 times per year    Marital Status: Married   Allergies  Allergen Reactions   Bee Venom Anaphylaxis   Iodine Solution [Povidone Iodine] Itching    Patient states his arm was irritated following a previous arm surgery where he had been scrubbed with betadine.   Family History  Problem Relation Age of Onset   Alcohol abuse Mother    Healthy Mother    Alcohol abuse Father    COPD Father        Theatre stage manager, smoker   Arthritis Maternal Grandmother    Cancer Maternal Grandmother        ?abdominal   Breast cancer Sister    Alzheimer's disease Maternal Grandfather    Heart attack Paternal Grandfather      Current Outpatient Medications (Cardiovascular):    EPINEPHrine 0.3 mg/0.3 mL IJ SOAJ injection, Inject 0.3 mLs (0.3 mg total) into the muscle as needed for anaphylaxis.   propranolol (INDERAL) 20 MG tablet, TAKE 1 TABLET BY MOUTH TWICE  DAILY  Current Outpatient Medications (Respiratory):    levocetirizine (XYZAL) 5 MG tablet, Take 5 mg by mouth every evening.    Current Outpatient Medications (Other):    Cholecalciferol (VITAMIN D) 50 MCG (2000 UT) CAPS, Take by mouth.   citalopram (CELEXA) 20 MG tablet, TAKE 1 TABLET BY MOUTH DAILY   fish oil-omega-3 fatty acids 1000 MG capsule, Take 1 g by mouth daily.   Glucosamine-Chondroitin (GLUCOSAMINE CHONDR COMPLEX PO), Take 1 tablet by mouth 2 (two) times daily.   Multiple Vitamin (MULITIVITAMIN WITH MINERALS) TABS, Take 1 tablet by mouth daily.   Tart Cherry 1200 MG CAPS, Take by mouth.   Turmeric 500 MG TABS, Take by mouth.   Reviewed prior external information including notes and imaging from  primary care provider As well as notes that were available from care everywhere and other healthcare systems.  Past medical history, social, surgical and family history all reviewed in electronic medical record.  No pertanent information unless stated regarding to  the chief complaint.   Review of Systems:  No headache, visual changes, nausea, vomiting, diarrhea, constipation, dizziness, abdominal pain, skin rash, fevers, chills, night sweats, weight loss, swollen lymph nodes, body aches, joint swelling, chest pain, shortness of breath, mood changes. POSITIVE muscle aches  Objective  Blood pressure 112/72, pulse 65, height 6\' 7"  (2.007 m), SpO2 93%.   General: No apparent distress alert and oriented x3 mood and affect normal, dressed appropriately.  HEENT: Pupils equal, extraocular movements intact  Respiratory: Patient's speak in full sentences and does not appear short of breath  Cardiovascular: No lower extremity edema,  non tender, no erythema  Foot exam shows a patient does have breakdown of the transverse arch noted.  Hammering of the second and third toes and more pain over this area in the midfoot.  Limited muscular skeletal ultrasound was performed and interpreted by Antoine Primas, M  Limited ultrasound shows significant hypoechoic changes of the second and third MTP joints.  No significant swelling of the first MTP joint which is a significant improvement.   Procedure: Real-time Ultrasound Guided Injection of second and third MTP joints Device: GE Logiq Q7 Ultrasound guided injection is preferred based studies that show increased duration, increased effect, greater accuracy, decreased procedural pain, increased response rate, and decreased cost with ultrasound guided versus blind injection.  Verbal informed consent obtained.  Time-out conducted.  Noted no overlying erythema, induration, or other signs of local infection.  Skin prepped in a sterile fashion.  Local anesthesia: Topical Ethyl chloride.  With sterile technique and under real time ultrasound guidance: With a 25-gauge half inch needle injected with 0.5 cc of 0.5% Marcaine and 0.5 cc of Kenalog 40 mg/mL into the second MTP joint and then same dosing in the third MTP  joint. Completed without difficulty  Pain immediately resolved suggesting accurate placement of the medication.  Images saved Advised to call if fevers/chills, erythema, induration, drainage, or persistent bleeding.  Impression: Technically successful ultrasound guided injection.    Impression and Recommendations:     The above documentation has been reviewed and is accurate and complete Judi Saa, DO

## 2023-07-13 ENCOUNTER — Encounter: Payer: Self-pay | Admitting: Family Medicine

## 2023-07-13 ENCOUNTER — Other Ambulatory Visit: Payer: Self-pay

## 2023-07-13 DIAGNOSIS — M79672 Pain in left foot: Secondary | ICD-10-CM

## 2023-07-14 ENCOUNTER — Ambulatory Visit
Admission: RE | Admit: 2023-07-14 | Discharge: 2023-07-14 | Disposition: A | Discharge status: Home / Self Care | Source: Ambulatory Visit | Attending: Family Medicine | Admitting: Family Medicine

## 2023-07-14 DIAGNOSIS — M79672 Pain in left foot: Secondary | ICD-10-CM | POA: Diagnosis not present

## 2023-07-14 DIAGNOSIS — M2012 Hallux valgus (acquired), left foot: Secondary | ICD-10-CM | POA: Diagnosis not present

## 2023-07-14 DIAGNOSIS — M19072 Primary osteoarthritis, left ankle and foot: Secondary | ICD-10-CM | POA: Diagnosis not present

## 2023-07-17 ENCOUNTER — Ambulatory Visit: Payer: Medicare Other | Admitting: Family Medicine

## 2023-07-18 ENCOUNTER — Encounter: Payer: Self-pay | Admitting: Family Medicine

## 2023-07-25 ENCOUNTER — Ambulatory Visit (AMBULATORY_SURGERY_CENTER): Payer: Medicare Other

## 2023-07-25 VITALS — Ht 79.0 in | Wt 240.0 lb

## 2023-07-25 DIAGNOSIS — Z1211 Encounter for screening for malignant neoplasm of colon: Secondary | ICD-10-CM

## 2023-07-25 MED ORDER — SUFLAVE 178.7 G PO SOLR: 1.0000 | Freq: Once | ORAL | 0 refills | Status: AC

## 2023-07-25 NOTE — Progress Notes (Signed)
 No egg or soy allergy known to patient  No issues known to pt with past sedation with any surgeries or procedures Patient denies ever being told they had issues or difficulty with intubation  No FH of Malignant Hyperthermia Pt is not on diet pills Pt is not on  home 02  Pt is not on blood thinners  Pt denies issues with chronic constipation  No current A-fib; patient states he had an ablation to rectify and has had no further issues Have any cardiac testing pending--no

## 2023-08-16 ENCOUNTER — Encounter: Payer: Self-pay | Admitting: Internal Medicine

## 2023-08-21 ENCOUNTER — Ambulatory Visit (AMBULATORY_SURGERY_CENTER): Payer: Medicare Other | Admitting: Internal Medicine

## 2023-08-21 ENCOUNTER — Encounter: Payer: Self-pay | Admitting: Internal Medicine

## 2023-08-21 VITALS — BP 116/69 | HR 53 | Temp 97.2°F | Resp 12 | Ht 79.0 in | Wt 240.0 lb

## 2023-08-21 DIAGNOSIS — K648 Other hemorrhoids: Secondary | ICD-10-CM | POA: Diagnosis not present

## 2023-08-21 DIAGNOSIS — Z1211 Encounter for screening for malignant neoplasm of colon: Secondary | ICD-10-CM

## 2023-08-21 DIAGNOSIS — D124 Benign neoplasm of descending colon: Secondary | ICD-10-CM

## 2023-08-21 MED ORDER — SODIUM CHLORIDE 0.9 % IV SOLN: 500.0000 mL | Freq: Once | INTRAVENOUS | Status: DC

## 2023-08-21 NOTE — Progress Notes (Signed)
 Called to room to assist during endoscopic procedure.  Patient ID and intended procedure confirmed with present staff. Received instructions for my participation in the procedure from the performing physician.

## 2023-08-21 NOTE — Progress Notes (Signed)
 Vss nad trans to pacu

## 2023-08-21 NOTE — Progress Notes (Signed)
 GASTROENTEROLOGY PROCEDURE H&P NOTE   Primary Care Physician: Aida House, MD    Reason for Procedure:   Colon cancer screening  Plan:    Colonoscopy  Patient is appropriate for endoscopic procedure(s) in the ambulatory ([facility]) setting.  The nature of the procedure, as well as the risks, benefits, and alternatives were carefully and thoroughly reviewed with the patient. Ample time for discussion and questions allowed. The patient understood, was satisfied, and agreed to proceed.     HPI: Jeffrey [name] is a 68 y.o. male who presents for colonoscopy for colon cancer screening. Denies blood in stools, changes in bowel habits, or unintentional weight loss. Denies family history of colon cancer. Last colonoscopy in 2015 was normal.  Past Medical History:  Diagnosis Date   Abnormal Doppler ultrasound of carotid artery 07-06-2012   normal carotid doppler   Acute appendicitis 03/30/2017   AF (atrial fibrillation) (HCC)    Appendicitis, acute 03/30/2017   Chronic knee pain 07/23/2015   H/O echocardiogram 06-26-2012   Transthoracic echo    EF 55-60%   H/O prior ablation treatment 2011   History of atrial flutter    History of cardiac monitoring 06-15-2012   sinus    Hx of bee sting allergy 07/23/2015   Normal cardiac stress test 05-24-2007   low risk scan    Past Surgical History:  Procedure Laterality Date   ATRIAL FIBRILLATION ABLATION     COLONOSCOPY     LAPAROSCOPIC APPENDECTOMY N/A 03/30/2017   Procedure: APPENDECTOMY LAPAROSCOPIC;  Surgeon: Oralee Billow, MD;  Location: [facility];  Service: General;  Laterality: N/A;   left testicle removed (no cancer, benign mass)     othroscopic knee surgery bil     vericose veins stripped both legs      Prior to Admission medications   Medication Sig Start Date End Date Taking? Authorizing Provider  Cholecalciferol (VITAMIN D) 50 MCG (2000 UT) CAPS Take by mouth.    [provider]  citalopram  (CELEXA ) 20 MG tablet TAKE 1  TABLET BY MOUTH DAILY 06/28/23   Aida House, MD  EPINEPHrine  0.3 mg/0.3 mL IJ SOAJ injection Inject 0.3 mLs (0.3 mg total) into the muscle as needed for anaphylaxis. 03/20/19   Koberlein, Junell C, MD  fish oil-omega-3 fatty acids 1000 MG capsule Take 1 g by mouth daily.    [provider]  Glucosamine-Chondroitin (GLUCOSAMINE CHONDR COMPLEX PO) Take 1 tablet by mouth 2 (two) times daily.    [provider]  levocetirizine (XYZAL) 5 MG tablet Take 5 mg by mouth every evening.    [provider]  Multiple Vitamin (MULITIVITAMIN WITH MINERALS) TABS Take 1 tablet by mouth daily.    [provider]  propranolol  (INDERAL ) 20 MG tablet TAKE 1 TABLET BY MOUTH TWICE  DAILY 06/28/23   Aida House, MD  Tart Cherry 1200 MG CAPS Take by mouth.    [provider]  Turmeric 500 MG TABS Take by mouth.    [provider]    Current Outpatient Medications  Medication Sig Dispense Refill   Cholecalciferol (VITAMIN D) 50 MCG (2000 UT) CAPS Take by mouth.     citalopram  (CELEXA ) 20 MG tablet TAKE 1 TABLET BY MOUTH DAILY 90 tablet 3   EPINEPHrine  0.3 mg/0.3 mL IJ SOAJ injection Inject 0.3 mLs (0.3 mg total) into the muscle as needed for anaphylaxis. 1 each 2   fish oil-omega-3 fatty acids 1000 MG capsule Take 1 g by mouth daily.  Glucosamine-Chondroitin (GLUCOSAMINE CHONDR COMPLEX PO) Take 1 tablet by mouth 2 (two) times daily.     levocetirizine (XYZAL) 5 MG tablet Take 5 mg by mouth every evening.     Multiple Vitamin (MULITIVITAMIN WITH MINERALS) TABS Take 1 tablet by mouth daily.     propranolol  (INDERAL ) 20 MG tablet TAKE 1 TABLET BY MOUTH TWICE  DAILY 200 tablet 2   Tart Cherry 1200 MG CAPS Take by mouth.     Turmeric 500 MG TABS Take by mouth.     Current Facility-Administered Medications  Medication Dose Route Frequency Provider Last Rate Last Admin   0.9 %  sodium chloride  infusion  500 mL Intravenous Once Daina Drum, MD         Allergies as of 08/21/2023 - Review Complete 08/21/2023  Allergen Reaction Noted   Bee venom Anaphylaxis 07/12/2010   Iodine solution [povidone iodine] Itching 03/30/2017    Family History  Problem Relation Age of Onset   Alcohol abuse Mother    Healthy Mother    Alcohol abuse Father    COPD Father        Theatre stage manager, smoker   Breast cancer Sister    Arthritis Maternal Grandmother    Cancer Maternal Grandmother        ?abdominal   Alzheimer's disease Maternal Grandfather    Heart attack Paternal Grandfather    Colon cancer Neg Hx    Esophageal cancer Neg Hx    Rectal cancer Neg Hx    Stomach cancer Neg Hx    Colon polyps Neg Hx     Social History   Socioeconomic History   Marital status: Married    Spouse name: Not on file   Number of children: Not on file   Years of education: Not on file   Highest education level: Associate degree: occupational, Scientist, product/process development, or vocational program  Occupational History   Occupation: Production designer, theatre/television/film  Tobacco Use   Smoking status: Former    Current packs/day: 0.00    Average packs/day: 1.5 packs/day for 8.0 years (12.0 ttl pk-yrs)    Types: Cigarettes    Start date: 07/11/1969    Quit date: 07/11/1977    Years since quitting: 46.1   Smokeless tobacco: Never  Vaping Use   Vaping status: Never Used  Substance and Sexual Activity   Alcohol use: Yes    Alcohol/week: 21.0 standard drinks of alcohol    Types: 21 Standard drinks or equivalent per week   Drug use: No   Sexual activity: Not on file  Other Topics Concern   Not on file  Social History Narrative   Work or School: [employer] , 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      Home Situation: lives with wife      Spiritual Beliefs: none      Lifestyle: gym 3 days per week; diet is healthy      Social Drivers of Corporate investment banker Strain: Low Risk  (05/08/2023)   Overall Financial Resource Strain (CARDIA)    Difficulty of Paying Living Expenses: Not hard at all  Food Insecurity: No Food  Insecurity (05/08/2023)   Hunger Vital Sign    Worried About Running Out of Food in the Last Year: Never true    Ran Out of Food in the Last Year: Never true  Transportation Needs: No Transportation Needs (05/08/2023)   PRAPARE - Administrator, Civil Service (Medical): No    Lack of Transportation (Non-Medical): No  Physical Activity: Insufficiently Active (  05/08/2023)   Exercise Vital Sign    Days of Exercise per Week: 2 days    Minutes of Exercise per Session: 60 min  Stress: No Stress Concern Present (05/08/2023)   [employer] of Occupational Health - Occupational Stress Questionnaire    Feeling of Stress : Not at all  Social Connections: Moderately Isolated (05/08/2023)   Social Connection and Isolation Panel [NHANES]    Frequency of Communication with Friends and Family: Once a week    Frequency of Social Gatherings with Friends and Family: Never    Attends Religious Services: Never    Database administrator or Organizations: Yes    Attends Engineer, structural: More than 4 times per year    Marital Status: Married  Catering manager Violence: Not At Risk (05/08/2023)   Humiliation, Afraid, Rape, and Kick questionnaire    Fear of Current or Ex-Partner: No    Emotionally Abused: No    Physically Abused: No    Sexually Abused: No    Physical Exam: Vital signs in last 24 hours: There were no vitals taken for this visit. GEN: NAD EYE: Sclerae anicteric ENT: MMM CV: Non-tachycardic Pulm: No increased work of breathing GI: Soft, NT/ND NEURO:  Alert & Oriented   Regino Caprio, MD [facility]  08/21/2023 9:52 AM

## 2023-08-21 NOTE — Patient Instructions (Signed)
Please read handouts provided Await pathology results.   YOU HAD AN ENDOSCOPIC PROCEDURE TODAY AT [facility]:   Refer to the procedure report that was given to you for any specific questions about what was found during the examination.  If the procedure report does not answer your questions, please call your gastroenterologist to clarify.  If you requested that your care partner not be given the details of your procedure findings, then the procedure report has been included in a sealed envelope for you to review at your convenience later.  YOU SHOULD EXPECT: Some feelings of bloating in the abdomen. Passage of more gas than usual.  Walking can help get rid of the air that was put into your GI tract during the procedure and reduce the bloating. If you had a lower endoscopy (such as a colonoscopy or flexible sigmoidoscopy) you may notice spotting of blood in your stool or on the toilet paper. If you underwent a bowel prep for your procedure, you may not have a normal bowel movement for a few days.  Please Note:  You might notice some irritation and congestion in your nose or some drainage.  This is from the oxygen used during your procedure.  There is no need for concern and it should clear up in a day or so.  SYMPTOMS TO REPORT IMMEDIATELY:  Following lower endoscopy (colonoscopy or flexible sigmoidoscopy):  Excessive amounts of blood in the stool  Significant tenderness or worsening of abdominal pains  Swelling of the abdomen that is new, acute  Fever of 100F or higher  For urgent or emergent issues, a gastroenterologist can be reached at any hour by calling [phone]. Do not use MyChart messaging for urgent concerns.    DIET:  We do recommend a small meal at first, but then you may proceed to your regular diet.  Drink plenty of fluids but you should avoid alcoholic beverages for 24 hours.  ACTIVITY:  You should plan to take it easy for the rest of today and you should  NOT DRIVE or use heavy machinery until tomorrow (because of the sedation medicines used during the test).    FOLLOW UP: Our staff will call the number listed on your records the next business day following your procedure.  We will call around 7:15- 8:00 am to check on you and address any questions or concerns that you may have regarding the information given to you following your procedure. If we do not reach you, we will leave a message.     If any biopsies were taken you will be contacted by phone or by letter within the next 1-3 weeks.  Please call us at [phone] if you have not heard about the biopsies in 3 weeks.    SIGNATURES/CONFIDENTIALITY: You and/or your care partner have signed paperwork which will be entered into your electronic medical record.  These signatures attest to the fact that that the information above on your After Visit Summary has been reviewed and is understood.  Full responsibility of the confidentiality of this discharge information lies with you and/or your care-partner.

## 2023-08-21 NOTE — Op Note (Signed)
 [name] Patient Name: Jeffrey [name] Procedure Date: 08/21/2023 9:51 AM MRN: [id number] Endoscopist: Pedro Bourgeois , , [id number] Age: 68 Referring MD:  Date of Birth: [birth year] Gender: Male Account #: [id number] Procedure:                Colonoscopy Indications:              Screening for colorectal malignant neoplasm Medicines:                Monitored Anesthesia Care Procedure:                Pre-Anesthesia Assessment:                           - Prior to the procedure, a History and Physical                            was performed, and patient medications and                            allergies were reviewed. The patient's tolerance of                            previous anesthesia was also reviewed. The risks                            and benefits of the procedure and the sedation                            options and risks were discussed with the patient.                            All questions were answered, and informed consent                            was obtained. Prior Anticoagulants: The patient has                            taken no anticoagulant or antiplatelet agents. ASA                            Grade Assessment: II - A patient with mild systemic                            disease. After reviewing the risks and benefits,                            the patient was deemed in satisfactory condition to                            undergo the procedure.                           After obtaining informed consent, the colonoscope  was passed under direct vision. Throughout the                            procedure, the patient's blood pressure, pulse, and                            oxygen saturations were monitored continuously. The                            Olympus Scope SN: [id number] was introduced through                            the anus and advanced to the the terminal ileum.                            The  colonoscopy was performed without difficulty.                            The patient tolerated the procedure well. The                            quality of the bowel preparation was good. The                            terminal ileum, ileocecal valve, appendiceal                            orifice, and rectum were photographed. Scope In: 10:09:21 AM Scope Out: 10:32:14 AM Scope Withdrawal Time: 0 hours 17 minutes 0 seconds  Total Procedure Duration: 0 hours 22 minutes 53 seconds  Findings:                 The terminal ileum appeared normal.                           A 4 mm polyp was found in the descending colon. The                            polyp was sessile. The polyp was removed with a                            cold snare. Resection and retrieval were complete.                           Non-bleeding internal hemorrhoids were found during                            retroflexion. Complications:            No immediate complications. Estimated Blood Loss:     Estimated blood loss was minimal. Impression:               - The examined portion of the ileum was normal.                           -  One 4 mm polyp in the descending colon, removed                            with a cold snare. Resected and retrieved.                           - Non-bleeding internal hemorrhoids. Recommendation:           - Discharge patient to home (with escort).                           - Await pathology results.                           - The findings and recommendations were discussed                            with the patient. Dr Pedro Bourgeois "Gardiner" [place],  08/21/2023 10:36:47 AM

## 2023-08-21 NOTE — Progress Notes (Signed)
 Pt's states no medical or surgical changes since previsit or office visit.

## 2023-08-22 ENCOUNTER — Telehealth: Payer: Self-pay | Admitting: *Deleted

## 2023-08-22 NOTE — Telephone Encounter (Signed)
  Follow up Call-     08/21/2023    9:51 AM  Call back number  Post procedure Call Back phone  # [phone]  Permission to leave phone message Yes     Patient questions:  Do you have a fever, pain , or abdominal swelling? No. Pain Score  0 *  Have you tolerated food without any problems? Yes.    Have you been able to return to your normal activities? Yes.    Do you have any questions about your discharge instructions: Diet   No. Medications  No. Follow up visit  No.  Do you have questions or concerns about your Care? No.  Actions: * If pain score is 4 or above: No action needed, pain <4.

## 2023-08-23 ENCOUNTER — Encounter: Payer: Self-pay | Admitting: Internal Medicine

## 2023-08-23 LAB — SURGICAL PATHOLOGY

## 2023-08-29 ENCOUNTER — Ambulatory Visit: Admitting: Family Medicine

## 2023-09-12 NOTE — Progress Notes (Signed)
 [facility] [address] [place] [address] Phone: [phone] Subjective:   Delwyn Filippo, am serving as a scribe for Dr. Ronnell Coins.  I'm seeing this patient by the request  of:  Aida House, MD  CC: Left foot pain  [id number]  07/11/2023 Patient given injection and tolerated the procedure well, no swelling of the first MTP noted today after previous injection.  Hopeful that this will make an improvement.  If no significant improvement then I do think we need to consider MRI otherwise.  Follow-up again in 2 months      Update 09/13/2023 JACODY BENEKE is a 68 y.o. male coming in with complaint of L foot pain. Patient states that his ankle is unstable. When walking he feels like foot is inverting. Has been managing pain with orthotics but wonders if they are pushing foot into inversion. Does have hx of L ankle sprain.   MRI March 21st, 2025 IMPRESSION: 1. Hallux valgus with mild osteoarthritis of the first MTP joint. 2. Severe osteoarthritis of the dorsal aspect of the first TMT joint with subchondral marrow edema and cystic changes. 3. Moderate arthritic changes of the medial and lateral hallux sesamoid-metatarsal articulation.       Past Medical History:  Diagnosis Date   Abnormal Doppler ultrasound of carotid artery 07/06/2012   normal carotid doppler   Acute appendicitis 03/30/2017   AF (atrial fibrillation) (HCC)    Appendicitis, acute 03/30/2017   Arthritis    Chronic knee pain 07/23/2015   H/O echocardiogram 06/26/2012   Transthoracic echo    EF 55-60%   H/O prior ablation treatment 2011   History of atrial flutter    History of cardiac monitoring 06/15/2012   sinus    Hx of bee sting allergy 07/23/2015   Normal cardiac stress test 05/24/2007   low risk scan   Past Surgical History:  Procedure Laterality Date   ATRIAL FIBRILLATION ABLATION     COLONOSCOPY  08/2013   LAPAROSCOPIC APPENDECTOMY N/A  03/30/2017   Procedure: APPENDECTOMY LAPAROSCOPIC;  Surgeon: Oralee Billow, MD;  Location: [facility];  Service: General;  Laterality: N/A;   left testicle removed (no cancer, benign mass)     othroscopic knee surgery bil     vericose veins stripped both legs     Social History   Socioeconomic History   Marital status: Married    Spouse name: Not on file   Number of children: Not on file   Years of education: Not on file   Highest education level: Associate degree: occupational, Scientist, product/process development, or vocational program  Occupational History   Occupation: Production designer, theatre/television/film  Tobacco Use   Smoking status: Former    Current packs/day: 0.00    Average packs/day: 1.5 packs/day for 8.0 years (12.0 ttl pk-yrs)    Types: Cigarettes    Start date: 07/11/1969    Quit date: 07/11/1977    Years since quitting: 46.2   Smokeless tobacco: Never  Vaping Use   Vaping status: Never Used  Substance and Sexual Activity   Alcohol use: Yes    Alcohol/week: 21.0 standard drinks of alcohol    Types: 21 Standard drinks or equivalent per week   Drug use: No   Sexual activity: Yes  Other Topics Concern   Not on file  Social History Narrative   Work or School: [employer] , 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      Home Situation: lives with wife      Spiritual Beliefs: none  Lifestyle: gym 3 days per week; diet is healthy      Social Drivers of Corporate investment banker Strain: Low Risk  (05/08/2023)   Overall Financial Resource Strain (CARDIA)    Difficulty of Paying Living Expenses: Not hard at all  Food Insecurity: No Food Insecurity (05/08/2023)   Hunger Vital Sign    Worried About Running Out of Food in the Last Year: Never true    Ran Out of Food in the Last Year: Never true  Transportation Needs: No Transportation Needs (05/08/2023)   PRAPARE - Administrator, Civil Service (Medical): No    Lack of Transportation (Non-Medical): No  Physical Activity: Insufficiently Active (05/08/2023)   Exercise Vital Sign    Days  of Exercise per Week: 2 days    Minutes of Exercise per Session: 60 min  Stress: No Stress Concern Present (05/08/2023)   [employer] of Occupational Health - Occupational Stress Questionnaire    Feeling of Stress : Not at all  Social Connections: Moderately Isolated (05/08/2023)   Social Connection and Isolation Panel [NHANES]    Frequency of Communication with Friends and Family: Once a week    Frequency of Social Gatherings with Friends and Family: Never    Attends Religious Services: Never    Database administrator or Organizations: Yes    Attends Engineer, structural: More than 4 times per year    Marital Status: Married   Allergies  Allergen Reactions   Bee Venom Anaphylaxis   Iodine Solution [Povidone Iodine] Itching    Patient states his arm was irritated following a previous arm surgery where he had been scrubbed with betadine.   Family History  Problem Relation Age of Onset   Alcohol abuse Mother    Healthy Mother    Alcohol abuse Father    COPD Father        Theatre stage manager, smoker   Breast cancer Sister    Arthritis Maternal Grandmother    Cancer Maternal Grandmother        ?abdominal   Alzheimer's disease Maternal Grandfather    Heart attack Paternal Grandfather    Colon cancer Neg Hx    Esophageal cancer Neg Hx    Rectal cancer Neg Hx    Stomach cancer Neg Hx    Colon polyps Neg Hx      Current Outpatient Medications (Cardiovascular):    EPINEPHrine  0.3 mg/0.3 mL IJ SOAJ injection, Inject 0.3 mLs (0.3 mg total) into the muscle as needed for anaphylaxis.   propranolol  (INDERAL ) 20 MG tablet, TAKE 1 TABLET BY MOUTH TWICE  DAILY  Current Outpatient Medications (Respiratory):    levocetirizine (XYZAL) 5 MG tablet, Take 5 mg by mouth every evening.    Current Outpatient Medications (Other):    Cholecalciferol (VITAMIN D) 50 MCG (2000 UT) CAPS, Take by mouth.   citalopram  (CELEXA ) 20 MG tablet, TAKE 1 TABLET BY MOUTH DAILY   fish oil-omega-3  fatty acids 1000 MG capsule, Take 1 g by mouth daily.   Glucosamine-Chondroitin (GLUCOSAMINE CHONDR COMPLEX PO), Take 1 tablet by mouth 2 (two) times daily.   Multiple Vitamin (MULITIVITAMIN WITH MINERALS) TABS, Take 1 tablet by mouth daily.   Tart Cherry 1200 MG CAPS, Take by mouth.   Turmeric 500 MG TABS, Take by mouth.   Reviewed prior external information including notes and imaging from  primary care provider As well as notes that were available from care everywhere and other healthcare systems.  Past  medical history, social, surgical and family history all reviewed in electronic medical record.  No pertanent information unless stated regarding to the chief complaint.   Review of Systems:  No headache, visual changes, nausea, vomiting, diarrhea, constipation, dizziness, abdominal pain, skin rash, fevers, chills, night sweats, weight loss, swollen lymph nodes, body aches, joint swelling, chest pain, shortness of breath, mood changes. POSITIVE muscle aches  Objective  Blood pressure 110/72, pulse 67, height 6\' 7"  (2.007 m), weight 241 lb (109.3 kg), SpO2 96%.   General: No apparent distress alert and oriented x3 mood and affect normal, dressed appropriately.  HEENT: Pupils equal, extraocular movements intact  Respiratory: Patient's speak in full sentences and does not appear short of breath  Cardiovascular: No lower extremity edema, non tender, no erythema  Patient does have some lateral column instability noted of the ankle.  Patient does have tightness noted of the inside part of the posterior tibialis.  Rigid midfoot noted.  Does have arthritic changes of the first toe noted.  Limited muscular skeletal ultrasound was performed and interpreted by Ronnell Coins, M   Limited ultrasound shows hypoechoic changes in the metatarsal joint.  Patient also has severe arthritis at the TMT joint.  [id number]; 15 additional minutes spent for Therapeutic exercises as stated in above notes.  This  included exercises focusing on stretching, strengthening, with significant focus on eccentric aspects.   Long term goals include an improvement in range of motion, strength, endurance as well as avoiding reinjury. Patient's frequency would include in 1-2 times a day, 3-5 times a week for a duration of 6-12 weeks. Ankle strengthening that included:  Basic range of motion exercises to allow proper full motion at ankle Stretching of the lower leg and hamstrings  Theraband exercises for the lower leg - inversion, eversion, dorsiflexion and plantarflexion each to be completed with a theraband Balance exercises to increase proprioception Weight bearing exercises to increase strength and balance  Proper technique shown and discussed handout in great detail with ATC.  All questions were discussed and answered.      Impression and Recommendations:    The above documentation has been reviewed and is accurate and complete Bobbyjoe Pabst M Bram Hottel, DO

## 2023-09-13 ENCOUNTER — Encounter: Payer: Self-pay | Admitting: Family Medicine

## 2023-09-13 ENCOUNTER — Ambulatory Visit: Admitting: Family Medicine

## 2023-09-13 ENCOUNTER — Other Ambulatory Visit: Payer: Self-pay

## 2023-09-13 VITALS — BP 110/72 | HR 67 | Ht 79.0 in | Wt 241.0 lb

## 2023-09-13 DIAGNOSIS — M79672 Pain in left foot: Secondary | ICD-10-CM

## 2023-09-13 DIAGNOSIS — M19072 Primary osteoarthritis, left ankle and foot: Secondary | ICD-10-CM | POA: Diagnosis not present

## 2023-09-13 NOTE — Assessment & Plan Note (Signed)
 No known arthritic changes noted.  The MRI does show the severe arthritis noted.  Discussed with patient about icing regimen and home exercises.  Discussed with patient to continue to rocker-bottom shoes.  Worsening pain can consider the possibility injection.  Surgical intervention would be fusion and do not think it is necessary.  Does have some lateral column overload of the ankle and given exercises today.  Discussed icing regimen and home exercises.  Follow-up again in 6 to 8 weeks

## 2023-09-13 NOTE — Patient Instructions (Signed)
 Changes to orthotic You can buy more online Ankle stability See me again in 2-3 months

## 2023-09-26 ENCOUNTER — Encounter: Payer: Self-pay | Admitting: Family Medicine

## 2023-09-26 ENCOUNTER — Ambulatory Visit (INDEPENDENT_AMBULATORY_CARE_PROVIDER_SITE_OTHER): Admitting: Family Medicine

## 2023-09-26 VITALS — BP 128/60 | HR 65 | Temp 98.7°F | Ht 79.0 in | Wt 243.7 lb

## 2023-09-26 DIAGNOSIS — G5791 Unspecified mononeuropathy of right lower limb: Secondary | ICD-10-CM

## 2023-09-26 DIAGNOSIS — R4689 Other symptoms and signs involving appearance and behavior: Secondary | ICD-10-CM | POA: Diagnosis not present

## 2023-09-26 MED ORDER — BUPROPION HCL ER (SR) 100 MG PO TB12: 100.0000 mg | ORAL_TABLET | Freq: Every day | ORAL | 3 refills | Status: DC

## 2023-09-26 NOTE — Progress Notes (Unsigned)
 Established Patient Office Visit  Subjective   Patient ID: Jeffrey [name], male    DOB: Sep 11, 1955  Age: 68 y.o. MRN: [id number]  Chief Complaint  Patient presents with   Leg Pain    Patient complains of right anterior thigh numbness and "sensation of heat" when lying in bed on the right x2022, seen by specialist, recurrent last 2-3 weeks   mood    Patient complains of "not feeling the same" for the past 6 months," not bubbly, feels he should be getting more out of life"    Pt is reporting a chronic numbness in the anterior portion of the thigh, a sensation of heat there when lying in bed, sometimes when he is sitting as well. States he has seen the sports medicine specialist for this and was told it was arthritis, states that it is very positional, states that he thinks it is getting more frequent.   Mood -- patient states that his mother is 59 and she is living in assisted living facility in [place]  and he is under a lot of stress with her illness. States that he feels like there is a lack of motivation, states that he used to enjoy things like yardwork but now he doesn't really want to do them any more. States that he feels more distracted, less focused. Has been on citalopram  for many years which he has done well on it so far. No irritability really,     Current Outpatient Medications  Medication Instructions   buPROPion ER (WELLBUTRIN SR) 100 mg, Oral, Daily   Cholecalciferol (VITAMIN D) 50 MCG (2000 UT) CAPS Take by mouth.   citalopram  (CELEXA ) 20 mg, Oral, Daily   EPINEPHrine  (EPI-PEN) 0.3 mg, Intramuscular, As needed   fish oil-omega-3 fatty acids 1 g, Daily   Glucosamine-Chondroitin (GLUCOSAMINE CHONDR COMPLEX PO) 1 tablet, 2 times daily   levocetirizine (XYZAL) 5 mg, Every evening   Multiple Vitamin (MULITIVITAMIN WITH MINERALS) TABS 1 tablet, Daily   propranolol  (INDERAL ) 20 mg, Oral, 2 times daily   Tart Cherry 1200 MG CAPS Take by mouth.   Turmeric 500 MG TABS Take  by mouth.    Patient Active Problem List   Diagnosis Date Noted   Mononeuropathy of right lower extremity 09/27/2023   Low motivation 09/27/2023   Degenerative arthritis of toe joint, left 07/11/2023   Greater trochanteric bursitis of right hip 02/22/2023   Intention tremor 05/02/2022   Arthritis of right elbow 01/06/2022   Arthritis of first metatarsophalangeal (MTP) joint of left foot 09/21/2021   Lateral epicondylitis of left elbow 09/21/2021   Chronic knee pain 07/23/2015   Hx of bee sting allergy 07/23/2015      Review of Systems  All other systems reviewed and are negative.     Objective:     BP 128/60   Pulse 65   Temp 98.7 F (37.1 C) (Oral)   Ht 6\' 7"  (2.007 m)   Wt 243 lb 11.2 oz (110.5 kg)   SpO2 98%   BMI 27.45 kg/m    Physical Exam Vitals reviewed.  Constitutional:      Appearance: Normal appearance. He is well-groomed and normal weight.  Eyes:     Extraocular Movements: Extraocular movements intact.     Conjunctiva/sclera: Conjunctivae normal.  Neck:     Thyroid : No thyromegaly.  Cardiovascular:     Rate and Rhythm: Normal rate and regular rhythm.     Heart sounds: S1 normal and S2 normal. No murmur heard.  Pulmonary:     Effort: Pulmonary effort is normal.     Breath sounds: Normal breath sounds and air entry. No rales.  Abdominal:     General: Abdomen is flat. Bowel sounds are normal.  Musculoskeletal:     Right lower leg: No edema.     Left lower leg: No edema.  Neurological:     General: No focal deficit present.     Mental Status: He is alert and oriented to person, place, and time.     Gait: Gait is intact.  Psychiatric:        Mood and Affect: Mood and affect normal.      No results found for any visits on 09/26/23.    The 10-year ASCVD risk score (Arnett DK, et al., 2019) is: 16.8%    Assessment & Plan:  Mononeuropathy of right lower extremity Assessment & Plan: Chronic, ongoing, there is no motor weakness of the  extremity. Has been seen by sports medicine for his arthritis and this is not improving the sensation,  suspect there is a cutaneus sensory neuropathy or it could be a result of some pathology in the spine. I recommended referral to get an EMG study of the lower extremity to look at the nerves to see if there is damage to the nerve.   Orders: -     Ambulatory referral to Orthopedic Surgery  Low motivation Assessment & Plan: Pt has been on citalopram  for years and this was somewhat for his mood disorder and/or the tremor/nerves as he explained it to me. We discussed options for treatment of the worsening symptoms including medication vs therapy. Since his issues are mostly with distraction, low energy and motivation I recommended starting wellbutrin 100 mg daily to help with these issues during the day. I advised that he should message me in 2-4 weeks to let me know how heis doing on the medication and if he wishes to continue this.   Orders: -     buPROPion HCl ER (SR); Take 1 tablet (100 mg total) by mouth daily.  Dispense: 30 tablet; Refill: 3   30 minutes spent with patient today on 2 chronic ongoing issues.   Return in about 7 months (around 05/08/2024) for annual physical exam.    Aida House, MD

## 2023-09-27 DIAGNOSIS — R4689 Other symptoms and signs involving appearance and behavior: Secondary | ICD-10-CM | POA: Insufficient documentation

## 2023-09-27 DIAGNOSIS — G5791 Unspecified mononeuropathy of right lower limb: Secondary | ICD-10-CM | POA: Insufficient documentation

## 2023-09-27 NOTE — Assessment & Plan Note (Signed)
 Chronic, ongoing, there is no motor weakness of the extremity. Has been seen by sports medicine for his arthritis and this is not improving the sensation,  suspect there is a cutaneus sensory neuropathy or it could be a result of some pathology in the spine. I recommended referral to get an EMG study of the lower extremity to look at the nerves to see if there is damage to the nerve.

## 2023-09-27 NOTE — Assessment & Plan Note (Signed)
 Pt has been on citalopram  for years and this was somewhat for his mood disorder and/or the tremor/nerves as he explained it to me. We discussed options for treatment of the worsening symptoms including medication vs therapy. Since his issues are mostly with distraction, low energy and motivation I recommended starting wellbutrin 100 mg daily to help with these issues during the day. I advised that he should message me in 2-4 weeks to let me know how heis doing on the medication and if he wishes to continue this.

## 2023-10-06 DIAGNOSIS — M47816 Spondylosis without myelopathy or radiculopathy, lumbar region: Secondary | ICD-10-CM | POA: Diagnosis not present

## 2023-10-06 DIAGNOSIS — M545 Low back pain, unspecified: Secondary | ICD-10-CM | POA: Diagnosis not present

## 2023-10-06 DIAGNOSIS — R200 Anesthesia of skin: Secondary | ICD-10-CM | POA: Diagnosis not present

## 2023-10-20 ENCOUNTER — Ambulatory Visit: Admitting: Family Medicine

## 2023-11-14 ENCOUNTER — Ambulatory Visit: Admitting: Family Medicine

## 2024-01-02 ENCOUNTER — Ambulatory Visit (INDEPENDENT_AMBULATORY_CARE_PROVIDER_SITE_OTHER): Admitting: Family Medicine

## 2024-01-02 ENCOUNTER — Encounter: Payer: Self-pay | Admitting: Family Medicine

## 2024-01-02 VITALS — BP 88/60 | HR 67 | Temp 97.8°F | Ht 79.0 in | Wt 239.5 lb

## 2024-01-02 DIAGNOSIS — J0110 Acute frontal sinusitis, unspecified: Secondary | ICD-10-CM

## 2024-01-02 MED ORDER — PREDNISONE 20 MG PO TABS: 40.0000 mg | ORAL_TABLET | Freq: Every day | ORAL | 0 refills | Status: DC

## 2024-01-02 MED ORDER — AMOXICILLIN-POT CLAVULANATE 875-125 MG PO TABS: 1.0000 | ORAL_TABLET | Freq: Two times a day (BID) | ORAL | 0 refills | Status: AC

## 2024-01-02 NOTE — Progress Notes (Signed)
 Established Patient Office Visit  Subjective   Patient ID: Jeffrey [name], male    DOB: 01/10/1956  Age: 68 y.o. MRN: [id number]  Chief Complaint  Patient presents with   Sinus Problem    Post nasal drip x1 month  and severe sinus headache x1 week, tried Sudafed with some relief and Advil     Ear Pain    Bilateral ear pain x1 week    Sinus Problem  Discussed the use of AI scribe software for clinical note transcription with the patient, who gave verbal consent to proceed.  History of Present Illness   Jeffrey [name] is a 68 year old male who presents with headaches and postnasal drip.  He has been experiencing postnasal drip and cough for over a month, which he notes is typical for him. Over the last week, he has developed headaches that start on the left side and then move to the center of his head. He describes feeling run down and mentions slight ear pain, though he is unsure about the latter.  He has been using Sudafed and other OTC products, which provide mild relief. He takes levocetirizine every evening, which he started years ago to manage similar symptoms, and it has been effective in the past. He recalls being prescribed Allegra and Levocetirizine in the past, which also helped.  No coughing for the last two to two and a half weeks, and no fever, chills, sore throat, or significant sinus pressure. He mentions that his symptoms tend to occur around the same time every year, in late summer to early fall.        Current Outpatient Medications  Medication Instructions   amoxicillin -clavulanate (AUGMENTIN ) 875-125 MG tablet 1 tablet, Oral, 2 times daily   buPROPion  ER (WELLBUTRIN  SR) 100 mg, Oral, Daily   Cholecalciferol (VITAMIN D) 50 MCG (2000 UT) CAPS Take by mouth.   citalopram  (CELEXA ) 20 mg, Oral, Daily   EPINEPHrine  (EPI-PEN) 0.3 mg, Intramuscular, As needed   fish oil-omega-3 fatty acids 1 g, Daily   Glucosamine-Chondroitin (GLUCOSAMINE CHONDR COMPLEX PO)  1 tablet, 2 times daily   levocetirizine (XYZAL) 5 mg, Every evening   Multiple Vitamin (MULITIVITAMIN WITH MINERALS) TABS 1 tablet, Daily   predniSONE  (DELTASONE ) 40 mg, Oral, Daily with breakfast   propranolol  (INDERAL ) 20 mg, Oral, 2 times daily   Tart Cherry 1200 MG CAPS Take by mouth.   Turmeric 500 MG TABS Take by mouth.    Patient Active Problem List   Diagnosis Date Noted   Mononeuropathy of right lower extremity 09/27/2023   Low motivation 09/27/2023   Degenerative arthritis of toe joint, left 07/11/2023   Greater trochanteric bursitis of right hip 02/22/2023   Intention tremor 05/02/2022   Arthritis of right elbow 01/06/2022   Arthritis of first metatarsophalangeal (MTP) joint of left foot 09/21/2021   Lateral epicondylitis of left elbow 09/21/2021   Chronic knee pain 07/23/2015   Hx of bee sting allergy 07/23/2015     Review of Systems  All other systems reviewed and are negative.     Objective:     BP (!) 88/60   Pulse 67   Temp 97.8 F (36.6 C) (Oral)   Ht 6' 7 (2.007 m)   Wt 239 lb 8 oz (108.6 kg)   SpO2 99%   BMI 26.98 kg/m    Physical Exam Vitals reviewed.  Constitutional:      Appearance: Normal appearance. He is normal weight.  HENT:  Right Ear: Tympanic membrane normal.     Left Ear: Tympanic membrane normal.     Nose: Congestion and rhinorrhea present.     Left Turbinates: Swollen (erythema of the mucosal layer).     Mouth/Throat:     Mouth: Mucous membranes are moist.     Pharynx: No posterior oropharyngeal erythema.  Eyes:     Conjunctiva/sclera: Conjunctivae normal.  Neurological:     Mental Status: He is alert.      No results found for any visits on 01/02/24.    The ASCVD Risk score (Arnett DK, et al., 2019) failed to calculate for the following reasons:   The valid systolic blood pressure range is 90 to 200 mmHg    Assessment & Plan:  Acute non-recurrent frontal sinusitis -     Amoxicillin -Pot Clavulanate; Take 1  tablet by mouth 2 (two) times daily for 7 days.  Dispense: 14 tablet; Refill: 0 -     predniSONE ; Take 2 tablets (40 mg total) by mouth daily with breakfast.  Dispense: 8 tablet; Refill: 0    Assessment and Plan    Acute sinusitis Acute sinusitis with postnasal drip, headache, and slight ear pain persisting over a month. Symptoms unresponsive to OTC medications and antihistamines. - Prescribed Augmentin  875 mg BID for 7 days. - Prescribed prednisone  for 4 days to reduce inflammation. - Advised monitoring for diarrhea and taking antibiotics with food. - Sent prescriptions to [facility] at [facility].         No follow-ups on file.    Heron CHRISTELLA Sharper, MD

## 2024-01-02 NOTE — Patient Instructions (Signed)
 Nasal saline or netti pot to rinse the sinuses.

## 2024-01-04 ENCOUNTER — Ambulatory Visit: Admitting: Family Medicine

## 2024-01-06 ENCOUNTER — Encounter: Payer: Self-pay | Admitting: Family Medicine

## 2024-01-10 ENCOUNTER — Encounter: Payer: Self-pay | Admitting: Family Medicine

## 2024-01-10 ENCOUNTER — Ambulatory Visit (INDEPENDENT_AMBULATORY_CARE_PROVIDER_SITE_OTHER): Admitting: Family Medicine

## 2024-01-10 VITALS — BP 100/64 | HR 70 | Temp 97.9°F | Ht 79.0 in | Wt 240.4 lb

## 2024-01-10 DIAGNOSIS — J302 Other seasonal allergic rhinitis: Secondary | ICD-10-CM | POA: Diagnosis not present

## 2024-01-10 NOTE — Progress Notes (Signed)
 Established Patient Office Visit   Subjective  Patient ID: Jeffrey [name], male    DOB: 1955-12-07  Age: 68 y.o. MRN: [id number]  Chief Complaint  Patient presents with   Acute Visit    Patient came in today for a follow-up, frontal sinusitis, patient was seen 9/9 and given Augmentin  and prednisone , patient is still having symptoms, headaches, fatigue, ear pressure,     Pt is a 68 yo male followed by Dr. Ozell and seen for f/u on acute concern.  Pt seen by pcp 9/9 for HA, pressure behind eyes and ear pressure.  Given rx for Augmentin  875-125 x 7 d and prednisone .  Pt notes symptoms were improving, then returned upon completing prednisone .  Pt endorses slight HA, watery eyes, occasional ear pressure.  Denies fever, chills, ST, post-nasal drainage, cough, facial pain/pressure.  Taking OTC xyzal.  Previously on Allegra but felt it was no longer as effective.  Pt traveling to [place]  on Saturday.    Patient Active Problem List   Diagnosis Date Noted   Mononeuropathy of right lower extremity 09/27/2023   Low motivation 09/27/2023   Degenerative arthritis of toe joint, left 07/11/2023   Greater trochanteric bursitis of right hip 02/22/2023   Intention tremor 05/02/2022   Arthritis of right elbow 01/06/2022   Arthritis of first metatarsophalangeal (MTP) joint of left foot 09/21/2021   Lateral epicondylitis of left elbow 09/21/2021   Chronic knee pain 07/23/2015   Hx of bee sting allergy 07/23/2015   Past Medical History:  Diagnosis Date   Abnormal Doppler ultrasound of carotid artery 07/06/2012   normal carotid doppler   Acute appendicitis 03/30/2017   AF (atrial fibrillation) (HCC)    Allergy 11/24/05   Anxiety 06/14/1998   Appendicitis, acute 03/30/2017   Arthritis    Chronic knee pain 07/23/2015   H/O echocardiogram 06/26/2012   Transthoracic echo    EF 55-60%   H/O prior ablation treatment 2011   History of atrial flutter    History of cardiac monitoring 06/15/2012   sinus     Hx of bee sting allergy 07/23/2015   Normal cardiac stress test 05/24/2007   low risk scan   Past Surgical History:  Procedure Laterality Date   APPENDECTOMY     ATRIAL FIBRILLATION ABLATION     COLONOSCOPY  08/2013   LAPAROSCOPIC APPENDECTOMY N/A 03/30/2017   Procedure: APPENDECTOMY LAPAROSCOPIC;  Surgeon: Eletha Boas, MD;  Location: [facility];  Service: General;  Laterality: N/A;   left testicle removed (no cancer, benign mass)     othroscopic knee surgery bil     vericose veins stripped both legs     Social History   Tobacco Use   Smoking status: Former    Current packs/day: 0.00    Average packs/day: 1.5 packs/day for 8.0 years (12.0 ttl pk-yrs)    Types: Cigarettes    Start date: 07/11/1969    Quit date: 07/11/1977    Years since quitting: 46.5   Smokeless tobacco: Never  Vaping Use   Vaping status: Never Used  Substance Use Topics   Alcohol use: Yes    Alcohol/week: 21.0 standard drinks of alcohol    Types: 21 Standard drinks or equivalent per week   Drug use: No   Family History  Problem Relation Age of Onset   Alcohol abuse Mother    Alcohol abuse Father    COPD Father        Theatre stage manager, smoker   Breast cancer Sister  Arthritis Maternal Grandmother    Cancer Maternal Grandmother        ?abdominal   Alzheimer's disease Maternal Grandfather    Heart attack Paternal Grandfather    Colon cancer Neg Hx    Esophageal cancer Neg Hx    Rectal cancer Neg Hx    Stomach cancer Neg Hx    Colon polyps Neg Hx    Allergies  Allergen Reactions   Bee Venom Anaphylaxis   Iodine Solution [Povidone Iodine] Itching    Patient states his arm was irritated following a previous arm surgery where he had been scrubbed with betadine.    ROS Negative unless stated above    Objective:     BP 100/64 (BP Location: Left Arm, Patient Position: Sitting, Cuff Size: Large)   Pulse 70   Temp 97.9 F (36.6 C) (Oral)   Ht 6' 7 (2.007 m)   Wt 240 lb 6.4 oz (109 kg)   SpO2  98%   BMI 27.08 kg/m  BP Readings from Last 3 Encounters:  01/10/24 100/64  01/02/24 (!) 88/60  09/26/23 128/60   Wt Readings from Last 3 Encounters:  01/10/24 240 lb 6.4 oz (109 kg)  01/02/24 239 lb 8 oz (108.6 kg)  09/26/23 243 lb 11.2 oz (110.5 kg)      Physical Exam Constitutional:      General: He is not in acute distress.    Appearance: Normal appearance.  HENT:     Head: Normocephalic and atraumatic.     Comments: B/l TMs full.    Right Ear: Hearing, ear canal and external ear normal.     Left Ear: Hearing, ear canal and external ear normal.     Nose: Nose normal.     Mouth/Throat:     Mouth: Mucous membranes are moist.     Pharynx: Postnasal drip present.     Tonsils: No tonsillar exudate.  Cardiovascular:     Rate and Rhythm: Normal rate and regular rhythm.     Heart sounds: Normal heart sounds. No murmur heard.    No gallop.  Pulmonary:     Effort: Pulmonary effort is normal. No respiratory distress.     Breath sounds: Normal breath sounds. No wheezing, rhonchi or rales.  Skin:    General: Skin is warm and dry.  Neurological:     Mental Status: He is alert and oriented to person, place, and time.     No results found for any visits on 01/10/24.    Assessment & Plan:   Seasonal allergic rhinitis, unspecified trigger  Allergic rhinitis like symptoms status post Augmentin  875-125 mg x 7 d and prednisone  for sinusitis.  Some improvement noted.  Advised symptoms of headache, watery eyes, ear pressure more consistent with allergies.  Continue OTC antihistamine, Xyzal.  Start Flonase  daily.  If symptoms become worse at the end of the week patient to notify clinic.  Return if symptoms worsen or fail to improve.   Clotilda JONELLE Single, MD

## 2024-01-28 ENCOUNTER — Other Ambulatory Visit: Payer: Self-pay | Admitting: Family Medicine

## 2024-01-28 DIAGNOSIS — R4689 Other symptoms and signs involving appearance and behavior: Secondary | ICD-10-CM

## 2024-03-25 ENCOUNTER — Ambulatory Visit: Payer: Self-pay

## 2024-03-25 NOTE — Telephone Encounter (Signed)
 If he continues to have symptoms then ok to schedule or send him to urgent care

## 2024-03-25 NOTE — Telephone Encounter (Signed)
 Appt scheduled on 12/2 at 4:20pm.

## 2024-03-25 NOTE — Telephone Encounter (Signed)
 FYI Only or Action Required?: FYI only for provider: appointment scheduled on 03/26/24.  Patient was last seen in primary care on 01/10/2024 by Mercer Clotilda SAUNDERS, MD.  Called Nurse Triage reporting Vomiting.  Symptoms began yesterday.  Interventions attempted: Rest, hydration, or home remedies.  Symptoms are: gradually improving.  Triage Disposition: See Physician Within 24 Hours  Patient/caregiver understands and will follow disposition?: Yes  Reason for Disposition  [1] MILD or MODERATE vomiting AND [2] present > 48 hours (2 days)  (Exception: Mild vomiting with associated diarrhea.)  Answer Assessment - Initial Assessment Questions Norma from [facility] [place] transferred patient over to be triaged, no crm. Denies trying new foods or medications. Patient states vomit was slightly red, but he had eaten a chicken parm sandwich around 1 pm and that was the last time eating. Vomiting started around 10pm and stopped around 3am  1. VOMITING SEVERITY: How many times have you vomited in the past 24 hours?      8-9 times last night  2. ONSET: When did the vomiting begin?      Last night  3. FLUIDS: What fluids or food have you vomited up today? Have you been able to keep any fluids down?     Drinking water and eating yogurt and oatmeal  4. ABDOMEN PAIN: Are your having any abdomen pain? If Yes : How bad is it and what does it feel like? (e.g., crampy, dull, intermittent, constant)      Abd pain from vomiting last night, slight discomfort today but not bad  5. DIARRHEA: Is there any diarrhea? If Yes, ask: How many times today?      Denies  6. CONTACTS: Is there anyone else in the family with the same symptoms?      Denies  7. CAUSE: What do you think is causing your vomiting?     Unsure  8. HYDRATION STATUS: Any signs of dehydration? (e.g., dry mouth [not only dry lips], too weak to stand) When did you last urinate?     Denies, able to drink  9. OTHER  SYMPTOMS: Do you have any other symptoms? (e.g., fever, headache, vertigo, vomiting blood or coffee grounds, recent head injury)     Fatigued, dizzy like the room is spinning  Protocols used: Vomiting-A-AH

## 2024-03-26 ENCOUNTER — Encounter: Payer: Self-pay | Admitting: Family Medicine

## 2024-03-26 ENCOUNTER — Ambulatory Visit: Admitting: Family Medicine

## 2024-03-26 VITALS — BP 110/50 | HR 72 | Temp 98.8°F | Ht 79.0 in | Wt 242.5 lb

## 2024-03-26 DIAGNOSIS — H8309 Labyrinthitis, unspecified ear: Secondary | ICD-10-CM | POA: Diagnosis not present

## 2024-03-26 MED ORDER — MECLIZINE HCL 25 MG PO TABS: 12.5000 mg | ORAL_TABLET | Freq: Three times a day (TID) | ORAL | 1 refills | Status: DC | PRN

## 2024-03-26 NOTE — Progress Notes (Unsigned)
 Acute Office Visit  Subjective:     Patient ID: Jeffrey [name], male    DOB: [birth year], 68 y.o.   MRN: [id number]  Chief Complaint  Patient presents with   Emesis    X2 days   Abdominal Pain    X2 days   Dizziness    X2 days    Emesis  Associated symptoms include abdominal pain and dizziness.  Abdominal Pain Associated symptoms include vomiting.  Dizziness Associated symptoms include abdominal pain and vomiting.  Discussed the use of AI scribe software for clinical note transcription with the patient, who gave verbal consent to proceed.  History of Present Illness   Jeffrey [name] is a 68 year old male who presents with recent episodes of severe vomiting and vertigo.  He had sudden onset of severe vomiting on Sunday, with four to five episodes without prior nausea, accompanied by vertigo described as room spinning. Symptoms recurred at about 3 AM with similar vomiting and dizziness and were worse with head movement, making it hard to sleep. By the next day the vomiting had resolved and vertigo largely subsided, but he felt fatigued.  He denies fever, chills, nasal congestion, ear pain, sore throat, diarrhea, head trauma, facial droop, memory loss, or hearing loss. He notes dry mouth at night requiring water and a mild dull headache at times. He has not had a bowel movement since the vomiting, though he had one before symptom onset. He is worried because he has never had these symptoms before and is planning air travel to [place]  for the holidays.       Review of Systems  Gastrointestinal:  Positive for abdominal pain and vomiting.  Neurological:  Positive for dizziness.  All other systems reviewed and are negative.       Objective:    BP (!) 110/50   Pulse 72   Temp 98.8 F (37.1 C) (Oral)   Ht 6' 7 (2.007 m)   Wt 242 lb 8 oz (110 kg)   SpO2 97%   BMI 27.32 kg/m    Physical Exam Vitals reviewed.  Constitutional:      Appearance: He is  well-developed.  Cardiovascular:     Rate and Rhythm: Normal rate and regular rhythm.     Heart sounds: Normal heart sounds. No murmur heard. Abdominal:     General: Bowel sounds are normal.     Tenderness: There is no abdominal tenderness. There is no guarding.  Neurological:     Mental Status: He is alert.     No results found for any visits on 03/26/24.      Assessment & Plan:   Problem List Items Addressed This Visit   None Visit Diagnoses       Acute viral labyrinthitis, unspecified laterality    -  Primary   Relevant Medications   meclizine  (ANTIVERT ) 25 MG tablet     Assessment and Plan    Acute viral labyrinthitis Acute onset of vertigo and vomiting, likely due to viral labyrinthitis. Symptoms began suddenly and were severe, with room spinning sensation and vomiting. Symptoms have improved significantly, indicating a viral etiology. No associated fever, chills, nasal congestion, ear pain, sore throat, facial droop, memory loss, or hearing loss. No diarrhea, suggesting it is not a gastrointestinal infection. No severe abdominal pain, ruling out gallbladder disease. Examination of ears, throat, heart, and lungs was normal. Differential diagnosis includes vestibular neuritis and labyrinthitis, with the latter being more likely given the symptomatology and  rapid improvement. - Prescribed meclizine (Antivert) for symptomatic relief of vertigo and nausea, to be taken as needed. - Advised to monitor symptoms and report if he worsens or recurs. - No further workup needed as symptoms have resolved.        Meds ordered this encounter  Medications   meclizine (ANTIVERT) 25 MG tablet    Sig: Take 0.5-1 tablets (12.5-25 mg total) by mouth 3 (three) times daily as needed for dizziness.    Dispense:  30 tablet    Refill:  1    No follow-ups on file.  Heron CHRISTELLA Sharper, MD

## 2024-03-27 NOTE — Telephone Encounter (Signed)
 Noted- ok to close.

## 2024-05-15 ENCOUNTER — Encounter: Admitting: Family Medicine

## 2024-05-22 ENCOUNTER — Ambulatory Visit: Admitting: Family Medicine

## 2024-05-22 ENCOUNTER — Encounter: Payer: Self-pay | Admitting: Family Medicine

## 2024-05-22 VITALS — BP 98/60 | HR 60 | Temp 98.5°F | Ht 79.0 in | Wt 243.9 lb

## 2024-05-22 DIAGNOSIS — G252 Other specified forms of tremor: Secondary | ICD-10-CM | POA: Diagnosis not present

## 2024-05-22 DIAGNOSIS — F411 Generalized anxiety disorder: Secondary | ICD-10-CM

## 2024-05-22 DIAGNOSIS — E782 Mixed hyperlipidemia: Secondary | ICD-10-CM | POA: Diagnosis not present

## 2024-05-22 DIAGNOSIS — Z131 Encounter for screening for diabetes mellitus: Secondary | ICD-10-CM | POA: Diagnosis not present

## 2024-05-22 DIAGNOSIS — Z0000 Encounter for general adult medical examination without abnormal findings: Secondary | ICD-10-CM | POA: Diagnosis not present

## 2024-05-22 DIAGNOSIS — Z125 Encounter for screening for malignant neoplasm of prostate: Secondary | ICD-10-CM

## 2024-05-22 LAB — COMPREHENSIVE METABOLIC PANEL WITH GFR
ALT: 27 U/L (ref 3–53)
AST: 24 U/L (ref 5–37)
Albumin: 4.6 g/dL (ref 3.5–5.2)
Alkaline Phosphatase: 55 U/L (ref 39–117)
BUN: 14 mg/dL (ref 6–23)
CO2: 31 meq/L (ref 19–32)
Calcium: 9.7 mg/dL (ref 8.4–10.5)
Chloride: 102 meq/L (ref 96–112)
Creatinine, Ser: 0.98 mg/dL (ref 0.40–1.50)
GFR: 77.92 mL/min
Glucose, Bld: 86 mg/dL (ref 70–99)
Potassium: 4.4 meq/L (ref 3.5–5.1)
Sodium: 137 meq/L (ref 135–145)
Total Bilirubin: 0.7 mg/dL (ref 0.2–1.2)
Total Protein: 6.7 g/dL (ref 6.0–8.3)

## 2024-05-22 LAB — LIPID PANEL
Cholesterol: 165 mg/dL (ref 28–200)
HDL: 47.8 mg/dL
LDL Cholesterol: 91 mg/dL (ref 10–99)
NonHDL: 117.67
Total CHOL/HDL Ratio: 3
Triglycerides: 134 mg/dL (ref 10.0–149.0)
VLDL: 26.8 mg/dL (ref 0.0–40.0)

## 2024-05-22 LAB — HEMOGLOBIN A1C: Hgb A1c MFr Bld: 4.9 % (ref 4.6–6.5)

## 2024-05-22 LAB — PSA, MEDICARE: PSA: 1.33 ng/mL (ref 0.10–4.00)

## 2024-05-22 MED ORDER — PROPRANOLOL HCL 20 MG PO TABS: 20.0000 mg | ORAL_TABLET | Freq: Two times a day (BID) | ORAL | 2 refills | Status: AC

## 2024-05-22 MED ORDER — CITALOPRAM HYDROBROMIDE 20 MG PO TABS: 20.0000 mg | ORAL_TABLET | Freq: Every day | ORAL | 3 refills | Status: AC

## 2024-05-22 NOTE — Progress Notes (Signed)
 "  Chief Complaint  Patient presents with   Medicare Wellness     Subjective:   Jeffrey [name] is a 69 y.o. male who presents for a Medicare Annual Wellness Visit.  Visit info / Clinical Intake: Medicare Wellness Visit Type:: Subsequent Annual Wellness Visit Persons participating in visit and providing information:: patient Medicare Wellness Visit Mode:: In-person (required for WTM) Interpreter Needed?: No Pre-visit prep was completed: no AWV questionnaire completed by patient prior to visit?: no Living arrangements:: lives with spouse/significant other Patient's Overall Health Status Rating: very good Typical amount of pain: none Does pain affect daily life?: no Are you currently prescribed opioids?: no  Dietary Habits and Nutritional Risks How many meals a day?: 3 Eats fruit and vegetables daily?: yes Most meals are obtained by: preparing own meals In the last 2 weeks, have you had any of the following?: none Diabetic:: no  Functional Status Activities of Daily Living (to include ambulation/medication): Independent Ambulation: Independent Medication Administration: Independent Home Management (perform basic housework or laundry): Independent Manage your own finances?: yes Primary transportation is: driving Concerns about vision?: no *vision screening is required for WTM* Concerns about hearing?: no  Fall Screening Falls in the past year?: 0 Number of falls in past year: 0 Was there an injury with Fall?: 0 Fall Risk Category Calculator: 0 Patient Fall Risk Level: Low Fall Risk  Fall Risk Patient at Risk for Falls Due to: No Fall Risks Fall risk Follow up: Falls evaluation completed  Home and Transportation Safety: All rugs have non-skid backing?: yes All stairs or steps have railings?: yes Grab bars in the bathtub or shower?: (!) no Have non-skid surface in bathtub or shower?: yes Good home lighting?: yes Regular seat belt use?: yes Hospital stays in the  last year:: no  Cognitive Assessment Difficulty concentrating, remembering, or making decisions? : yes Will 6CIT or Mini Cog be Completed: yes What year is it?: 0 points What month is it?: 0 points Give patient an address phrase to remember (5 components): [address] About what time is it?: 0 points Count backwards from 20 to 1: 0 points Say the months of the year in reverse: 0 points Repeat the address phrase from earlier: 0 points 6 CIT Score: 0 points  Advance Directives (For Healthcare) Does Patient Have a Medical Advance Directive?: Yes Type of Advance Directive: Living will; Healthcare Power of Attorney Copy of Healthcare Power of Attorney in Chart?: No - copy requested Copy of Living Will in Chart?: No - copy requested  Reviewed/Updated  Reviewed/Updated: Reviewed All (Medical, Surgical, Family, Medications, Allergies, Care Teams, Patient Goals)    Allergies (verified) Bee venom and Iodine solution [povidone iodine]   Current Medications (verified) Outpatient Encounter Medications as of 05/22/2024  Medication Sig   Cholecalciferol (VITAMIN D) 50 MCG (2000 UT) CAPS Take by mouth.   EPINEPHrine  0.3 mg/0.3 mL IJ SOAJ injection Inject 0.3 mLs (0.3 mg total) into the muscle as needed for anaphylaxis.   fish oil-omega-3 fatty acids 1000 MG capsule Take 1 g by mouth daily.   Glucosamine-Chondroitin (GLUCOSAMINE CHONDR COMPLEX PO) Take 1 tablet by mouth 2 (two) times daily.   levocetirizine (XYZAL) 5 MG tablet Take 5 mg by mouth every evening.   Multiple Vitamin (MULITIVITAMIN WITH MINERALS) TABS Take 1 tablet by mouth daily.   Tart Cherry 1200 MG CAPS Take by mouth.   Turmeric 500 MG TABS Take by mouth.   [DISCONTINUED] citalopram  (CELEXA ) 20 MG tablet TAKE 1 TABLET  BY MOUTH DAILY   [DISCONTINUED] predniSONE  (DELTASONE ) 20 MG tablet Take 2 tablets (40 mg total) by mouth daily with breakfast.   [DISCONTINUED] propranolol  (INDERAL ) 20 MG tablet TAKE 1 TABLET BY  MOUTH TWICE  DAILY   citalopram  (CELEXA ) 20 MG tablet Take 1 tablet (20 mg total) by mouth daily.   propranolol  (INDERAL ) 20 MG tablet Take 1 tablet (20 mg total) by mouth 2 (two) times daily.   [DISCONTINUED] meclizine  (ANTIVERT ) 25 MG tablet Take 0.5-1 tablets (12.5-25 mg total) by mouth 3 (three) times daily as needed for dizziness.   No facility-administered encounter medications on file as of 05/22/2024.    History: Past Medical History:  Diagnosis Date   Abnormal Doppler ultrasound of carotid artery 07/06/2012   normal carotid doppler   Acute appendicitis 03/30/2017   AF (atrial fibrillation) (HCC)    Allergy 11/24/05   Anxiety 06/14/1998   Appendicitis, acute 03/30/2017   Arthritis    Chronic knee pain 07/23/2015   H/O echocardiogram 06/26/2012   Transthoracic echo    EF 55-60%   H/O prior ablation treatment 2011   History of atrial flutter    History of cardiac monitoring 06/15/2012   sinus    Hx of bee sting allergy 07/23/2015   Normal cardiac stress test 05/24/2007   low risk scan   Past Surgical History:  Procedure Laterality Date   APPENDECTOMY     ATRIAL FIBRILLATION ABLATION     COLONOSCOPY  08/2013   LAPAROSCOPIC APPENDECTOMY N/A 03/30/2017   Procedure: APPENDECTOMY LAPAROSCOPIC;  Surgeon: Eletha Boas, MD;  Location: [facility];  Service: General;  Laterality: N/A;   left testicle removed (no cancer, benign mass)     othroscopic knee surgery bil     vericose veins stripped both legs     Family History  Problem Relation Age of Onset   Alcohol abuse Mother    Alcohol abuse Father    COPD Father        theatre stage manager, smoker   Breast cancer Sister    Arthritis Maternal Grandmother    Cancer Maternal Grandmother        ?abdominal   Alzheimer's disease Maternal Grandfather    Heart attack Paternal Grandfather    Colon cancer Neg Hx    Esophageal cancer Neg Hx    Rectal cancer Neg Hx    Stomach cancer Neg Hx    Colon polyps Neg Hx    Social History    Occupational History   Occupation: Production Designer, Theatre/television/film  Tobacco Use   Smoking status: Former    Current packs/day: 0.00    Average packs/day: 1.5 packs/day for 8.0 years (12.0 ttl pk-yrs)    Types: Cigarettes    Start date: 07/11/1969    Quit date: 07/11/1977    Years since quitting: 46.8   Smokeless tobacco: Never  Vaping Use   Vaping status: Never Used  Substance and Sexual Activity   Alcohol use: Yes    Alcohol/week: 21.0 standard drinks of alcohol    Types: 21 Standard drinks or equivalent per week   Drug use: No   Sexual activity: Yes   Tobacco Counseling Counseling given: Not Answered  SDOH Screenings   Food Insecurity: No Food Insecurity (03/25/2024)  Housing: Unknown (03/25/2024)  Transportation Needs: No Transportation Needs (03/25/2024)  Utilities: Not At Risk (05/22/2024)  Alcohol Screen: Low Risk (03/25/2024)  Depression (PHQ2-9): Low Risk (05/22/2024)  Financial Resource Strain: Low Risk (03/25/2024)  Physical Activity: Insufficiently Active (03/25/2024)  Social Connections: Moderately  Isolated (05/22/2024)  Stress: No Stress Concern Present (05/22/2024)  Tobacco Use: Medium Risk (05/22/2024)  Health Literacy: Adequate Health Literacy (05/22/2024)   See flowsheets for full screening details  Depression Screen PHQ 2 & 9 Depression Scale- Over the past 2 weeks, how often have you been bothered by any of the following problems? Little interest or pleasure in doing things: 0 Feeling down, depressed, or hopeless (PHQ Adolescent also includes...irritable): 0 PHQ-2 Total Score: 0     Goals Addressed   None     Review of Systems  HENT:  Negative for hearing loss.   Eyes:  Negative for blurred vision.  Respiratory:  Negative for shortness of breath.   Cardiovascular:  Negative for chest pain.  Gastrointestinal: Negative.   Genitourinary: Negative.   Musculoskeletal:  Negative for back pain.  Neurological:  Negative for headaches.  Psychiatric/Behavioral:  Negative for  depression.   All other systems reviewed and are negative.       Objective:    Today's Vitals   05/22/24 0800  BP: 98/60  Pulse: 60  Temp: 98.5 F (36.9 C)  TempSrc: Oral  SpO2: 98%  Weight: 243 lb 14.4 oz (110.6 kg)  Height: 6' 7 (2.007 m)   Body mass index is 27.48 kg/m. Physical Exam Vitals reviewed.  Constitutional:      Appearance: Normal appearance. He is normal weight.  HENT:     Right Ear: Tympanic membrane normal.     Left Ear: Tympanic membrane normal.     Nose: Nose normal.     Mouth/Throat:     Mouth: Mucous membranes are moist.     Pharynx: No posterior oropharyngeal erythema.  Eyes:     Conjunctiva/sclera: Conjunctivae normal.     Pupils: Pupils are equal, round, and reactive to light.  Neck:     Thyroid : No thyromegaly.  Cardiovascular:     Rate and Rhythm: Normal rate and regular rhythm.     Pulses: Normal pulses.     Heart sounds: No murmur heard. Pulmonary:     Effort: Pulmonary effort is normal.     Breath sounds: Normal breath sounds. No wheezing, rhonchi or rales.  Abdominal:     General: Abdomen is flat. Bowel sounds are normal.  Musculoskeletal:        General: Normal range of motion.     Right lower leg: No edema.     Left lower leg: No edema.  Lymphadenopathy:     Cervical: No cervical adenopathy.  Neurological:     Mental Status: He is alert and oriented to person, place, and time. Mental status is at baseline.  Psychiatric:        Mood and Affect: Mood normal.        Behavior: Behavior normal.     Hearing/Vision screen No results found. Immunizations and Health Maintenance Health Maintenance  Topic Date Due   COVID-19 Vaccine (8 - 2025-26 season) 08/09/2024   Medicare Annual Wellness (AWV)  05/22/2025   DTaP/Tdap/Td (3 - Td or Tdap) 04/26/2027   Colonoscopy  08/21/2030   Pneumococcal Vaccine: 50+ Years  Completed   Influenza Vaccine  Completed   Hepatitis C Screening  Completed   Zoster Vaccines- Shingrix   Completed    Meningococcal B Vaccine  Aged Out        Assessment/Plan:  This is a routine wellness examination for Agamjot. Problem List Items Addressed This Visit       Unprioritized   Intention tremor (Chronic)   Relevant Medications  propranolol  (INDERAL ) 20 MG tablet   Encounter for Medicare annual wellness exam - Primary   Other Visit Diagnoses       Routine adult health maintenance       Relevant Orders   CMP  General physical exam findings are normal today. I reviewed the patient's preventative testing, immunizations, and lifestyle habits. I made appropriate recommendations and placed orders for the appropriate tests and/or vaccinations. I counseled the patient on the CDC's recommendations for healthy exercise and diet. I counseled the patient on healthy sleep habits and stress management. Handouts to reinforce the counseling were given at the conclusion of the visit.       Diabetes mellitus screening       Relevant Orders   Hemoglobin A1c     Mixed hyperlipidemia       Relevant Medications   propranolol  (INDERAL ) 20 MG tablet   Other Relevant Orders   Lipid panel     GAD (generalized anxiety disorder)       Relevant Medications   citalopram  (CELEXA ) 20 MG tablet     Prostate cancer screening       Relevant Orders   PSA, Medicare       Patient Care Team: Ozell Heron HERO, MD as PCP - General (Family Medicine)  I have personally reviewed and noted the following in the patients chart:   Medical and social history Use of alcohol, tobacco or illicit drugs  Current medications and supplements including opioid prescriptions. Functional ability and status Nutritional status Physical activity Advanced directives List of other physicians Hospitalizations, surgeries, and ER visits in previous 12 months Vitals Screenings to include cognitive, depression, and falls Referrals and appointments  Orders Placed This Encounter  Procedures   Lipid panel    Standing Status:    Future    Number of Occurrences:   1    Expiration Date:   05/22/2025   CMP    Standing Status:   Future    Number of Occurrences:   1    Expiration Date:   05/22/2025   Hemoglobin A1c    Standing Status:   Future    Number of Occurrences:   1    Expiration Date:   05/22/2025   PSA, Medicare    Standing Status:   Future    Number of Occurrences:   1    Expiration Date:   05/22/2025   In addition, I have reviewed and discussed with patient certain preventive protocols, quality metrics, and best practice recommendations. A written personalized care plan for preventive services as well as general preventive health recommendations were provided to patient.   Heron HERO Ozell, MD   05/22/2024   Return in about 1 year (around 05/22/2025) for annual physical exam, AWV.  After Visit Summary: (In Person-Printed) AVS printed and given to the patient  Nurse Notes: N/A "

## 2024-05-23 ENCOUNTER — Ambulatory Visit: Payer: Self-pay | Admitting: Family Medicine

## 2025-05-23 ENCOUNTER — Encounter: Admitting: Family Medicine
